# Patient Record
Sex: Female | Born: 1955 | Race: White | Hispanic: No | Smoking: Former smoker
Health system: Southern US, Community
[De-identification: ages and names within clinical notes are randomized; demographics above are authoritative.]

## PROBLEM LIST (undated history)

## (undated) DIAGNOSIS — I10 Essential (primary) hypertension: Secondary | ICD-10-CM

## (undated) DIAGNOSIS — R609 Edema, unspecified: Secondary | ICD-10-CM

## (undated) DIAGNOSIS — M1990 Unspecified osteoarthritis, unspecified site: Secondary | ICD-10-CM

## (undated) DIAGNOSIS — R112 Nausea with vomiting, unspecified: Secondary | ICD-10-CM

## (undated) DIAGNOSIS — K219 Gastro-esophageal reflux disease without esophagitis: Secondary | ICD-10-CM

## (undated) DIAGNOSIS — E876 Hypokalemia: Secondary | ICD-10-CM

## (undated) DIAGNOSIS — J189 Pneumonia, unspecified organism: Secondary | ICD-10-CM

## (undated) DIAGNOSIS — J449 Chronic obstructive pulmonary disease, unspecified: Secondary | ICD-10-CM

## (undated) DIAGNOSIS — Z9889 Other specified postprocedural states: Secondary | ICD-10-CM

## (undated) DIAGNOSIS — R7303 Prediabetes: Secondary | ICD-10-CM

## (undated) DIAGNOSIS — M549 Dorsalgia, unspecified: Secondary | ICD-10-CM

## (undated) DIAGNOSIS — F419 Anxiety disorder, unspecified: Secondary | ICD-10-CM

## (undated) DIAGNOSIS — K279 Peptic ulcer, site unspecified, unspecified as acute or chronic, without hemorrhage or perforation: Secondary | ICD-10-CM

## (undated) DIAGNOSIS — K5790 Diverticulosis of intestine, part unspecified, without perforation or abscess without bleeding: Secondary | ICD-10-CM

## (undated) DIAGNOSIS — E7800 Pure hypercholesterolemia, unspecified: Secondary | ICD-10-CM

## (undated) DIAGNOSIS — G629 Polyneuropathy, unspecified: Secondary | ICD-10-CM

## (undated) DIAGNOSIS — C4491 Basal cell carcinoma of skin, unspecified: Secondary | ICD-10-CM

## (undated) DIAGNOSIS — G8929 Other chronic pain: Secondary | ICD-10-CM

## (undated) DIAGNOSIS — C801 Malignant (primary) neoplasm, unspecified: Secondary | ICD-10-CM

## (undated) HISTORY — PX: CHOLECYSTECTOMY: SHX55

## (undated) HISTORY — PX: KNEE SURGERY: SHX244

## (undated) HISTORY — DX: Polyneuropathy, unspecified: G62.9

## (undated) HISTORY — PX: ABDOMINAL HYSTERECTOMY: SHX81

## (undated) HISTORY — DX: Basal cell carcinoma of skin, unspecified: C44.91

---

## 1990-08-23 DIAGNOSIS — C801 Malignant (primary) neoplasm, unspecified: Secondary | ICD-10-CM

## 1990-08-23 HISTORY — DX: Malignant (primary) neoplasm, unspecified: C80.1

## 2003-01-31 ENCOUNTER — Ambulatory Visit (HOSPITAL_COMMUNITY): Admission: RE | Admit: 2003-01-31 | Discharge: 2003-01-31 | Payer: Self-pay

## 2003-08-24 HISTORY — PX: CARDIAC CATHETERIZATION: SHX172

## 2004-04-07 ENCOUNTER — Inpatient Hospital Stay (HOSPITAL_COMMUNITY): Admission: EM | Admit: 2004-04-07 | Discharge: 2004-04-10 | Payer: Self-pay | Admitting: Emergency Medicine

## 2004-04-07 ENCOUNTER — Encounter: Payer: Self-pay | Admitting: Emergency Medicine

## 2004-04-08 ENCOUNTER — Encounter (INDEPENDENT_AMBULATORY_CARE_PROVIDER_SITE_OTHER): Payer: Self-pay | Admitting: Cardiology

## 2006-03-30 ENCOUNTER — Ambulatory Visit (HOSPITAL_COMMUNITY): Admission: RE | Admit: 2006-03-30 | Discharge: 2006-03-30 | Payer: Self-pay | Admitting: Family Medicine

## 2007-12-01 ENCOUNTER — Ambulatory Visit (HOSPITAL_COMMUNITY): Admission: RE | Admit: 2007-12-01 | Discharge: 2007-12-01 | Payer: Self-pay | Admitting: Family Medicine

## 2007-12-11 ENCOUNTER — Ambulatory Visit (HOSPITAL_COMMUNITY): Admission: RE | Admit: 2007-12-11 | Discharge: 2007-12-11 | Payer: Self-pay | Admitting: Family Medicine

## 2007-12-25 ENCOUNTER — Encounter (INDEPENDENT_AMBULATORY_CARE_PROVIDER_SITE_OTHER): Payer: Self-pay | Admitting: General Surgery

## 2007-12-25 ENCOUNTER — Ambulatory Visit (HOSPITAL_COMMUNITY): Admission: RE | Admit: 2007-12-25 | Discharge: 2007-12-25 | Payer: Self-pay | Admitting: General Surgery

## 2008-01-20 ENCOUNTER — Emergency Department (HOSPITAL_COMMUNITY): Admission: EM | Admit: 2008-01-20 | Discharge: 2008-01-20 | Payer: Self-pay | Admitting: Emergency Medicine

## 2008-07-20 ENCOUNTER — Emergency Department (HOSPITAL_COMMUNITY): Admission: EM | Admit: 2008-07-20 | Discharge: 2008-07-20 | Payer: Self-pay | Admitting: Emergency Medicine

## 2008-10-30 ENCOUNTER — Emergency Department (HOSPITAL_COMMUNITY): Admission: EM | Admit: 2008-10-30 | Discharge: 2008-10-30 | Payer: Self-pay | Admitting: Emergency Medicine

## 2009-07-25 ENCOUNTER — Ambulatory Visit (HOSPITAL_COMMUNITY): Admission: RE | Admit: 2009-07-25 | Discharge: 2009-07-25 | Payer: Self-pay | Admitting: Family Medicine

## 2010-03-06 ENCOUNTER — Emergency Department (HOSPITAL_COMMUNITY): Admission: EM | Admit: 2010-03-06 | Discharge: 2010-03-06 | Payer: Self-pay | Admitting: Emergency Medicine

## 2010-07-11 ENCOUNTER — Emergency Department (HOSPITAL_COMMUNITY)
Admission: EM | Admit: 2010-07-11 | Discharge: 2010-07-11 | Payer: Self-pay | Source: Home / Self Care | Admitting: Emergency Medicine

## 2010-08-25 ENCOUNTER — Ambulatory Visit (HOSPITAL_COMMUNITY)
Admission: RE | Admit: 2010-08-25 | Discharge: 2010-08-25 | Payer: Self-pay | Source: Home / Self Care | Attending: Family Medicine | Admitting: Family Medicine

## 2010-11-03 LAB — BASIC METABOLIC PANEL
CO2: 26 mEq/L (ref 19–32)
Calcium: 10.3 mg/dL (ref 8.4–10.5)
GFR calc Af Amer: >60 mL/min (ref >60)
Glucose, Bld: 124 mg/dL — ABNORMAL HIGH (ref 70–99)
Potassium: 3.7 mEq/L (ref 3.5–5.1)
Sodium: 139 mEq/L (ref 135–145)

## 2010-11-03 LAB — DIFFERENTIAL
Eosinophils Absolute: 0.2 10*3/uL (ref 0.0–0.7)
Lymphs Abs: 2.6 10*3/uL (ref 0.7–4.0)
Monocytes Absolute: 0.7 10*3/uL (ref 0.1–1.0)
Monocytes Relative: 8 % (ref 3–12)
Neutro Abs: 5.8 10*3/uL (ref 1.7–7.7)
Neutrophils Relative %: 62 % (ref 43–77)

## 2010-11-03 LAB — CBC
HCT: 41 % (ref 36.0–46.0)
Hemoglobin: 14 g/dL (ref 12.0–15.0)
MCH: 30.9 pg (ref 26.0–34.0)
RBC: 4.52 MIL/uL (ref 3.87–5.11)

## 2010-11-07 LAB — CBC
HCT: 43.9 % (ref 36.0–46.0)
Hemoglobin: 15 g/dL (ref 12.0–15.0)
MCH: 31.6 pg (ref 26.0–34.0)
MCHC: 34.2 g/dL (ref 30.0–36.0)

## 2010-11-07 LAB — DIFFERENTIAL
Basophils Relative: 1 % (ref 0–1)
Eosinophils Absolute: 0.1 10*3/uL (ref 0.0–0.7)
Eosinophils Relative: 1 % (ref 0–5)
Monocytes Absolute: 0.8 10*3/uL (ref 0.1–1.0)
Monocytes Relative: 6 % (ref 3–12)

## 2010-11-07 LAB — BASIC METABOLIC PANEL
BUN: 15 mg/dL (ref 6–23)
CO2: 24 mEq/L (ref 19–32)
Glucose, Bld: 124 mg/dL — ABNORMAL HIGH (ref 70–99)
Potassium: 3.5 mEq/L (ref 3.5–5.1)
Sodium: 137 mEq/L (ref 135–145)

## 2010-11-07 LAB — POCT CARDIAC MARKERS
Myoglobin, poc: 67.3 ng/mL (ref 12–200)
Troponin i, poc: <0.05 ng/mL (ref 0.00–0.09)

## 2011-01-05 NOTE — H&P (Signed)
NAMECYNTHIE, GARMON               ACCOUNT NO.:  [id number]   MEDICAL RECORD NO.:  [id number]          PATIENT TYPE:  AMB   LOCATION:  DAY                           FACILITY:  [facility]   PHYSICIAN:  Dalia Heading, M.D.  DATE OF BIRTH:  October 20, 1955   DATE OF ADMISSION:  12/25/2007  DATE OF DISCHARGE:  LH                              HISTORY & PHYSICAL   CHIEF COMPLAINT:  Left breast neoplasm.   HISTORY OF PRESENT ILLNESS:  The patient is a 52-old-year white female  who is referred for evaluation and treatment of the left breast  neoplasm.  This was found on routine mammography and ultrasound.  She  denies feeling a lump.  No immediate family history of breast carcinoma  or nipple discharge is noted.   PAST MEDICAL HISTORY:  Includes hypertension.   PAST SURGICAL HISTORY:  1. Hysterectomy.  2. Cholecystectomy.  3. Bilateral knee surgeries.   CURRENT MEDICATIONS:  Blood pressure pill and a fluid pill.   ALLERGIES:  No known drug allergies.   REVIEW OF SYSTEMS:  The patient smokes half pack of cigarettes a day.  She drinks wine socially.  She denies any other cardiopulmonary  difficulties or bleeding disorders.   PHYSICAL EXAMINATION:  The patient is a well-developed, well-nourished  white female in no acute distress.  NECK:  Supple without lymphadenopathy.  LUNGS:  Clear to auscultation with equal breath sounds bilaterally.  HEART:  Reveals regular rate and rhythm without S3, S4, murmurs.  BREAST:  Right breast examination reveals no dominant mass, nipple  discharge, or dimpling.  The axilla is negative for palpable nodes.  Left breast examination reveals no dominant mass, nipple discharge, or  dimpling.  The axilla was negative for palpable nodes.   Mammography and ultrasound revealed a suspicious area in the left breast  at the 10 o'clock position.   IMPRESSION:  Left breast neoplasm.   PLAN:  The patient is scheduled for left breast biopsy after needle  localization on  Dec 25, 2007.  The risks and benefits of the procedure  including bleeding and infection were fully explained to the patient,  gave informed consent.      Dalia Heading, M.D.  Electronically Signed     MAJ/MEDQ  D:  12/19/2007  T:  12/20/2007  Job:  [id number]   cc:   [facility]  Fax: [phone]

## 2011-01-05 NOTE — Op Note (Signed)
[name], [name]               ACCOUNT NO.:  [id number]   MEDICAL RECORD NO.:  [id number]          PATIENT TYPE:  AMB   LOCATION:  DAY                           FACILITY:  [facility]   PHYSICIAN:  Dalia Heading, M.D.  DATE OF BIRTH:  February 19, 1956   DATE OF PROCEDURE:  12/25/2007  DATE OF DISCHARGE:                               OPERATIVE REPORT   PREOPERATIVE DIAGNOSIS:  Left breast mass, nonpalpable.   POSTOPERATIVE DIAGNOSIS:  Left breast mass, nonpalpable.   PROCEDURE:  Left breast biopsy after needle localization.   SURGEON:  Dalia Heading, MD   ANESTHESIA:  General.   INDICATIONS:  The patient is a 55 year old white female who is referred  for left breast biopsy after needle localization.  She has a suspicious  area at approximately the 12 o'clock position on mammogram, which is  suspicious.  The risks and benefits of the procedure including bleeding  and infection were fully explained to the patient, gave informed  consent.   PROCEDURE NOTE:  The patient was placed in a supine position after  needle localization was performed in the mammography suite.  The left  breast was prepped and draped in the usual sterile technique with  Betadine after induction of general anesthesia was performed.  The left  breast was prepped and draped in the usual sterile technique with  Betadine.  Surgical site confirmation was performed.   An incision was made that included the needle.  A coned section of  breast tissue was removed down to the tip of the needle.  The specimen  was then sent to Radiology for specimen radiography.  The suspicious  area was within the specimen removed.  It was then sent for pathology  for further examination.  Any bleeding was controlled using Bovie  electrocautery.  The skin was reapproximated using a 4-0 Vicryl  subcuticular suture.  A 0.5 Sensorcaine was instilled in the surrounding  wound.  Dermabond was then applied.   All tape and needle counts were  correct at the end of procedure.  The  patient was awakened and transferred to PACU in stable condition.   COMPLICATIONS:  None.   SPECIMEN:  Left breast biopsy.   ESTIMATED BLOOD LOSS:  Minimal.      Dalia Heading, M.D.  Electronically Signed     MAJ/MEDQ  D:  12/25/2007  T:  12/26/2007  Job:  [id number]

## 2011-01-08 NOTE — Cardiovascular Report (Signed)
[name], [name]                         ACCOUNT NO.:  [id number]   MEDICAL RECORD NO.:  [id number]                   PATIENT TYPE:  INP   LOCATION:  6533                                 FACILITY:  [facility]   PHYSICIAN:  John R. Tysinger, M.D.              DATE OF BIRTH:  Sep 18, 1955   DATE OF PROCEDURE:  04/07/2004  DATE OF DISCHARGE:                              CARDIAC CATHETERIZATION   REFERRING PHYSICIAN:  Dr. Nicki Guadalajara.   PROCEDURE:  1. Left heart catheterization, urgent.  2. Coronary cineangiography.  3. Left ventricular cineangiography.  4. Angio-Seal of the right femoral artery.   INDICATION FOR PROCEDURES:  This 55 year old female without a prior history  of heart disease has been having chest pain off and on for the last two  weeks.  Today she experienced another onset of chest pain with radiation  into her neck and left jaw, into the left back and shoulder and left arm.  She says that she feels like an elephant is sitting on her chest.  She  presented to the emergency department at [facility] and the  emergency room doctor referred her here for urgent care.  Her cardiac  markers were negative twice and her EKG did not show any acute changes.  She  was started an IV heparin and nitroglycerin drip without relief and was  given 6 mg of IV morphine.  In the emergency room at [facility] she was  given a GI cocktail with partial relief but then the heaviness and pressure  returned.  After discussing her condition and medical workup, she became  very anxious and said that her pain was getting worse and she seemed very  relieved when the decision was made to do her cardiac cath on an urgent  basis.   PROCEDURE:  After signing an informed consent, the patient was brought to  the cardiac catheterization lab where her right groin was prepped and draped  in a sterile fashion and anesthetized locally with 1% lidocaine.  A 6 French  introducer sheath was inserted  percutaneously into the right femoral artery.  The 6 [place] #4 Judkins coronary catheters were used to make injections into  the native coronary arteries.  A 6 French pigtail catheter was used to  measure pressures in the left ventricle and aorta and to make a midstream  injection into the left ventricle.  Patient tolerated the procedure well.  No complications were noted.  At the end of the procedure, the catheter and  sheath were removed from the right femoral artery and hemostasis was easily  obtained with an Angio-Seal closure system.   MEDICATIONS GIVEN:  None.   CINE FINDINGS:   CORONARY CINEANGIOGRAPHY:  1. Left coronary artery.  The ostium and left main appear normal.  2. Left anterior descending.  The LAD has mild ectasia in the proximal     segment with mild dye hang  up.  Otherwise, the LAD appears normal without     significant plaque and without significant stenosis.  The diagonal and     septal branches appear normal.  3. Circumflex coronary artery.  The circumflex is normal in appearance,     supplying the obtuse marginal and posterolateral branches.  There was no     significant plaque and no evidence of ectasia in the circumflex.  4. Right coronary artery.  The ostium appears normal.  There is an area of     mild ectasia in the proximal segment with mild hang up.  The middle and     distal segments appear normal.  The distal right coronary artery supplies     the posterior descending and two posterolateral branches which are normal     in appearance.   LEFT VENTRICULAR CINEANGIOGRAM:  The left ventricular chamber size is  normal.  The left ventricular wall thickness appears to be mildly increased.  The ejection fraction was estimated at approximately 80% and there is  generalized hyperdynamic contraction.  The mitral and aortic valves appear  normal.   FINAL DIAGNOSES:  1. 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  2. No evidence for atherosclerotic stenosis of the coronary arteries.  3. Hyperdynamic left ventricular function, ejection fraction 80%.  4. Possible hypertrophic cardiomyopathy.  5. Successful Angio-Seal of the right femoral artery.   DISPOSITION:  Will monitor over night and defer further workup and treatment  as per Dr. Tresa Endo.  Will continue the daily aspirin and possibly need to  increase her beta-blocker as tolerated.  Will also increase her Protonix to  b.i.d. and defer the counseling for her coronary ectasia to Dr. Tresa Endo.                                               John R. Tysinger, M.D.    JRT/MEDQ  D:  04/07/2004  T:  04/08/2004  Job:  [id number]   cc:   Nicki Guadalajara, M.D.  [phone] [address]  [place], [place] [address]  Fax: [phone]   Cath Lab - [facility]

## 2011-01-08 NOTE — Consult Note (Signed)
NAMETAYAH, IDROVO                         ACCOUNT NO.:  [id number]   MEDICAL RECORD NO.:  [id number]                   PATIENT TYPE:  INP   LOCATION:  6533                                 FACILITY:  [facility]   PHYSICIAN:  Althea Grimmer. Luther Parody, M.D.            DATE OF BIRTH:  July 18, 1956   DATE OF CONSULTATION:  04/09/2004  DATE OF DISCHARGE:                                   CONSULTATION   Ms. [name] is a 55 year old female whom I am asked to see for atypical  chest pain.  She has no history of coronary artery disease.  She entered the  hospital on April 07, 2004, complaining of burning in the chest along with  feeling of heavy pressure.  EKG was negative and cardiac markers were  negative.  A catheterization demonstrated ectatic coronaries but no  significant lesions that should be causing this discomfort.  She was given a  GI cocktail in the emergency room which did cause some relief of her  symptoms.  She has had some nausea and vomiting and the day after admission  has mild diarrhea.  She describes moderate heartburn untreated as an  outpatient.  She is status post cholecystectomy.  CT scan of the chest did  not demonstrate any emboli, aortic dissection or significant esophageal  pathology.  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  At current moment she says she still has some burning and discomfort of  pressure type in her chest.   PAST MEDICAL HISTORY:  She is status post cholecystectomy, has an underlying  diagnosis of hypertension, and underwent hysterectomy for gynecologic  cancer.   CURRENT MEDICATIONS IN HOSPITAL:  1. Aspirin.  2. Metoprolol XL 25 mg daily.  3. Protonix 40 mg b.i.d.  4. Wellbutrin 100 mg b.i.d.   ALLERGIES:  NONE REPORTED.   SOCIAL HISTORY:  She is a smoker since age 49.   REVIEW OF SYSTEMS:  GENERAL:  No weight loss or night sweats.  ENDOCRINE:  No history of diabetes or thyroid problems.  SKIN:  No rash, pruritus.  EYES:   No icterus or change in vision.  ENT:  No aphthous ulcers or chronic  sore throat.  RESPIRATORY:  No shortness of breath, cough or wheezing.  CARDIAC:  As above.  GASTROINTESTINAL:  As above.  GENITOURINARY:  No  dysuria or hematuria.   PHYSICAL EXAMINATION:  She is an obese adult female in no acute distress,  afebrile with stable vital signs.  SKIN:  Normal.  EYES:  Anicteric.  Oropharynx unremarkable.  NECK:  Supple without thyromegaly.  There is no cervical or inguinal  adenopathy.  CHEST SOUNDS:  Clear.  HEART SOUNDS:  Regular rate and rhythm.  ABDOMEN:  Obese with minimal epigastric tenderness to deep palpation.  Bowel  sounds normal.  No masses.  EXTREMITIES:  Without cyanosis, clubbing, edema or rash.   LABORATORY TESTS:  Reveal an  admission hemoglobin of 13 with drop to 11.6  after hydration __________.  Prothrombin time and liver function tests are  normal.   IMPRESSION:  Atypical chest pain in a 55 year old female with cardiac  evaluation that does not seem to explain her symptoms.  These are not likely  to be due to a biliary process and she is status post cholecystectomy and  her liver function tests are normal.  She does have some classic heartburn  and upper endoscopy is probably indicated to rule out complicated  esophagitis or any other upper GI pathology.   PLAN:  Please see the orders.  Upper endoscopy will be accomplished today.  The patient is in agreement after discussion of the procedure in terms of  __________ risks, complications including bleeding and perforation.   ADDENDUM:  Upper endoscopy reveals no GI pathology but multiple small antral  ulcers were seen.  Protonix b.i.d. should be sufficient for healing these.  She had a CLO test taken which is still pending.  It would be a good idea,  if possible, to decrease her aspirin to 81 mg per day and avoid other  NSAIDs.                                               Althea Grimmer. Luther Parody, M.D.     PJS/MEDQ  D:  04/09/2004  T:  04/09/2004  Job:  [id number]   cc:   Nicki Guadalajara, M.D.  [phone] [address]  [place], [place] [address]  Fax: [phone]   Nanetta Batty, M.D.  Fax: [phone]

## 2011-01-08 NOTE — Discharge Summary (Signed)
[name], [name]                         ACCOUNT NO.:  [id number]   MEDICAL RECORD NO.:  [id number]                   PATIENT TYPE:  INP   LOCATION:  6533                                 FACILITY:  [facility]   PHYSICIAN:  Nicki Guadalajara, M.D.                  DATE OF BIRTH:  1956/05/27   DATE OF ADMISSION:  04/07/2004  DATE OF DISCHARGE:  04/10/2004                                 DISCHARGE SUMMARY   DISCHARGE DIAGNOSES:  1. Chest pain consistent with unstable angina with abnormal EKG but normal     coronaries at catheterization.  2. Peptic ulcer disease with antral ulcers by endoscopy this admission,     CLOtest is pending at discharge.  3. Smoking.  4. Obesity.   HOSPITAL COURSE:  The patient is a 55 year old female followed by Dr. Fara Chute in [place].  She presented to [facility] ER with epigastric pain  consistent with unstable angina.  Her EKG was abnormal showing evidence of  poor anterior R wave progression consistent with an old anterolateral  infarct.  She was transferred to [facility]  Enzymes were negative x3.  She  continued to have pain which actually worsened when she got here and she was  taken to the cath lab by Dr. Aleen Campi.  Dr. Tresa Endo had received the original  call from the ER at [facility].  Catheterization showed no  significant coronary disease with normal LV function.  Dr. Aleen Campi felt she  may have hypertrophic cardiomyopathy. Echocardiogram was done which revealed  low normal LV function but normal LV wall thickness and no evidence of  significant LVH.  The patient was seen in consult by Dr. Roosvelt Harps.  Endoscopy was done April 10, 2003 which revealed antral ulcers.  The  patient does take an aspirin a day.  CLOtest is pending.  PPI is  recommended.  She did have a spiral CT also this admission that was negative  for pulmonary embolism but did show evidence of old granulomatous disease.  We feel the patient can be discharged April 10, 2004.   DISCHARGE MEDICATIONS:  Nexium 40 mg twice a day for 4 weeks and then once a  day.   LABORATORY DATA:  White count 7.7, hemoglobin 11.6, hematocrit 35.5,  platelets 212.  Sodium 141, potassium 3.5, BUN 16, creatinine 0.9.  CK-MB  was negative here.  Cholesterol 253, triglycerides 312, HDL 35, LDL 156.  TSH 2.32.   DISPOSITION:  The patient is discharged in stable condition and will follow  up with Dr. Domingo Sep in [place] once.  Will need to follow up her  CLOtest.  She has been instructed on a low fat diet and has been encouraged  to stop smoking.      Abelino Derrick, P.A.  Nicki Guadalajara, M.D.    Lenard Lance  D:  04/10/2004  T:  04/11/2004  Job:  [id number]   cc:   Fara Chute  [address] [place]  [place] [address]  Fax: [phone]   Althea Grimmer. Luther Parody, M.D.  [address]  [place]  [place] [address]  Fax: [phone]

## 2011-08-26 ENCOUNTER — Other Ambulatory Visit (HOSPITAL_COMMUNITY): Payer: Self-pay | Admitting: Family Medicine

## 2011-08-26 DIAGNOSIS — Z139 Encounter for screening, unspecified: Secondary | ICD-10-CM

## 2011-08-31 ENCOUNTER — Ambulatory Visit (HOSPITAL_COMMUNITY): Payer: PRIVATE HEALTH INSURANCE

## 2011-08-31 ENCOUNTER — Ambulatory Visit (HOSPITAL_COMMUNITY)
Admission: RE | Admit: 2011-08-31 | Discharge: 2011-08-31 | Disposition: A | Discharge status: Home / Self Care | Payer: PRIVATE HEALTH INSURANCE | Source: Ambulatory Visit | Attending: Family Medicine | Admitting: Family Medicine

## 2011-08-31 ENCOUNTER — Ambulatory Visit (HOSPITAL_COMMUNITY): Payer: Self-pay

## 2011-08-31 DIAGNOSIS — Z139 Encounter for screening, unspecified: Secondary | ICD-10-CM

## 2011-08-31 DIAGNOSIS — Z1231 Encounter for screening mammogram for malignant neoplasm of breast: Secondary | ICD-10-CM | POA: Insufficient documentation

## 2012-04-27 ENCOUNTER — Emergency Department (HOSPITAL_COMMUNITY)
Admission: EM | Admit: 2012-04-27 | Discharge: 2012-04-27 | Disposition: A | Discharge status: Home / Self Care | Payer: Self-pay | Attending: Emergency Medicine | Admitting: Emergency Medicine

## 2012-04-27 ENCOUNTER — Emergency Department (HOSPITAL_COMMUNITY): Payer: Self-pay

## 2012-04-27 ENCOUNTER — Encounter (HOSPITAL_COMMUNITY): Payer: Self-pay | Admitting: *Deleted

## 2012-04-27 DIAGNOSIS — R202 Paresthesia of skin: Secondary | ICD-10-CM

## 2012-04-27 DIAGNOSIS — E7800 Pure hypercholesterolemia, unspecified: Secondary | ICD-10-CM | POA: Insufficient documentation

## 2012-04-27 DIAGNOSIS — Z79899 Other long term (current) drug therapy: Secondary | ICD-10-CM | POA: Insufficient documentation

## 2012-04-27 DIAGNOSIS — R200 Anesthesia of skin: Secondary | ICD-10-CM

## 2012-04-27 DIAGNOSIS — F17200 Nicotine dependence, unspecified, uncomplicated: Secondary | ICD-10-CM | POA: Insufficient documentation

## 2012-04-27 DIAGNOSIS — R51 Headache: Secondary | ICD-10-CM | POA: Insufficient documentation

## 2012-04-27 DIAGNOSIS — R209 Unspecified disturbances of skin sensation: Secondary | ICD-10-CM | POA: Insufficient documentation

## 2012-04-27 DIAGNOSIS — M542 Cervicalgia: Secondary | ICD-10-CM | POA: Insufficient documentation

## 2012-04-27 DIAGNOSIS — I10 Essential (primary) hypertension: Secondary | ICD-10-CM | POA: Insufficient documentation

## 2012-04-27 HISTORY — DX: Other chronic pain: G89.29

## 2012-04-27 HISTORY — DX: Hypokalemia: E87.6

## 2012-04-27 HISTORY — DX: Essential (primary) hypertension: I10

## 2012-04-27 HISTORY — DX: Dorsalgia, unspecified: M54.9

## 2012-04-27 HISTORY — DX: Edema, unspecified: R60.9

## 2012-04-27 HISTORY — DX: Pure hypercholesterolemia, unspecified: E78.00

## 2012-04-27 LAB — BASIC METABOLIC PANEL
BUN: 14 mg/dL (ref 6–23)
GFR calc Af Amer: 67 mL/min — ABNORMAL LOW (ref >90)
GFR calc non Af Amer: 57 mL/min — ABNORMAL LOW (ref >90)
Potassium: 3.6 mEq/L (ref 3.5–5.1)
Sodium: 139 mEq/L (ref 135–145)

## 2012-04-27 LAB — CBC WITH DIFFERENTIAL/PLATELET
Basophils Absolute: 0 10*3/uL (ref 0.0–0.1)
Basophils Relative: 0 % (ref 0–1)
Eosinophils Absolute: 0.4 10*3/uL (ref 0.0–0.7)
HCT: 39.3 % (ref 36.0–46.0)
Hemoglobin: 13.1 g/dL (ref 12.0–15.0)
MCH: 30.8 pg (ref 26.0–34.0)
MCHC: 33.3 g/dL (ref 30.0–36.0)
MCV: 92.5 fL (ref 78.0–100.0)
Monocytes Relative: 7 % (ref 3–12)
Neutrophils Relative %: 53 % (ref 43–77)
Platelets: 250 10*3/uL (ref 150–400)
RBC: 4.25 MIL/uL (ref 3.87–5.11)
RDW: 13.7 % (ref 11.5–15.5)
WBC: 10.8 10*3/uL — ABNORMAL HIGH (ref 4.0–10.5)

## 2012-04-27 MED ORDER — PREDNISONE 10 MG PO TABS: ORAL_TABLET | ORAL | Status: DC

## 2012-04-27 MED ORDER — CYCLOBENZAPRINE HCL 5 MG PO TABS: 5.0000 mg | ORAL_TABLET | Freq: Three times a day (TID) | ORAL | Status: AC | PRN

## 2012-04-27 NOTE — ED Notes (Signed)
[name] in room to discuss Tele-Neurology consult.

## 2012-04-27 NOTE — ED Notes (Signed)
Reports left hand numbness radiating into left arm that started approx 2300 last night.  Denies difficulty ambulating, denies slurred speech.  Reports weakness to left side.  Mild left grip strength weakness noted in triage.  Face symmetrical, speech clear.

## 2012-04-27 NOTE — ED Notes (Signed)
Pt called out and said she did not want to wait for [facility] Neurology consult. Leona Singleton PA aware and notified.

## 2012-04-27 NOTE — ED Provider Notes (Signed)
History     CSN: [id number]  Arrival date & time 04/27/12  0820   First MD Initiated Contact with Patient 04/27/12 [phone]      Chief Complaint  Patient presents with  . Numbness    (Consider location/radiation/quality/duration/timing/severity/associated sxs/prior treatment) HPI Comments: [name] presents with numbness and weakness in her left upper extremity which started around 11pm last night while she was watching tv.  She reports prior episodes of similar symptoms lasting for 1-2 hours, than resolving. She has also developed a mild left sided headache.  She denies chest pain, shortness of breath, palpitations and has had no recent falls or injury.  She feels "tight" along her left lateral neck and also feels pressure along her left shoulder blade which is worse with palpation.  She has had no problems with dizziness,  Ambulating, and has had no facial weakness, numbness or slurred speech.  She feels weaker, however in her left arm.    The history is provided by the patient and a relative.    Past Medical History  Diagnosis Date  . Hypertension   . High cholesterol   . Fluid retention   . Hypokalemia   . Chronic back pain     Past Surgical History  Procedure Date  . Abdominal hysterectomy   . Cholecystectomy   . Knee surgery     right and left    No family history on file.  History  Substance Use Topics  . Smoking status: Current Everyday Smoker  . Smokeless tobacco: Not on file  . Alcohol Use: Yes     occasional    OB History    Grav Para Term Preterm Abortions TAB SAB Ect Mult Living                  Review of Systems  Constitutional: Negative for fever.  HENT: Positive for neck pain. Negative for congestion and sore throat.   Eyes: Negative.   Respiratory: Negative for chest tightness and shortness of breath.   Cardiovascular: Negative for chest pain.  Gastrointestinal: Negative for nausea and abdominal pain.  Genitourinary: Negative.     Musculoskeletal: Negative for joint swelling and arthralgias.  Skin: Negative.  Negative for rash and wound.  Neurological: Positive for weakness, numbness and headaches. Negative for dizziness, facial asymmetry, speech difficulty and light-headedness.  Hematological: Negative.   Psychiatric/Behavioral: Negative.     Allergies  Review of patient's allergies indicates no known allergies.  Home Medications   Current Outpatient Rx  Name Route Sig Dispense Refill  . ASPIRIN 325 MG PO TABS Oral Take 325 mg by mouth daily.    . FUROSEMIDE 20 MG PO TABS Oral Take 20 mg by mouth daily.    Marland Kitchen GEMFIBROZIL 600 MG PO TABS Oral Take 600 mg by mouth 2 (two) times daily before a meal.    . LISINOPRIL-HYDROCHLOROTHIAZIDE 20-12.5 MG PO TABS Oral Take 1 tablet by mouth daily.    . ONE-A-DAY WOMENS 50 PLUS PO Oral Take 1 tablet by mouth daily.    Marland Kitchen POTASSIUM GLUCONATE 550 MG PO TABS Oral Take 1-2 tablets by mouth daily. Takes with fluid pill. May take extra tablet due to cramping in legs.    Marland Kitchen PRAVASTATIN SODIUM 40 MG PO TABS Oral Take 40 mg by mouth daily.    . CYCLOBENZAPRINE HCL 5 MG PO TABS Oral Take 1 tablet (5 mg total) by mouth 3 (three) times daily as needed for muscle spasms. 15 tablet 0  .  PREDNISONE 10 MG PO TABS  6, 5, 4, 3, 2 then 1 tablet by mouth daily for 6 days total. 21 tablet 0    BP 148/91  Pulse 67  Temp 97.8 F (36.6 C) (Oral)  Resp 17  SpO2 95%  Physical Exam  Nursing note and vitals reviewed. Constitutional: She is oriented to person, place, and time. She appears well-developed and well-nourished.  HENT:  Head: Normocephalic and atraumatic.  Mouth/Throat: Oropharynx is clear and moist.  Eyes: EOM are normal. Pupils are equal, round, and reactive to light.  Neck: Normal range of motion. Neck supple. Muscular tenderness present. No spinous process tenderness present. Carotid bruit is not present. No edema, no erythema and normal range of motion present.     Cardiovascular: Normal rate, normal heart sounds and intact distal pulses.   Pulmonary/Chest: Effort normal.  Abdominal: Soft. There is no tenderness.  Musculoskeletal: Normal range of motion.  Lymphadenopathy:    She has no cervical adenopathy.  Neurological: She is alert and oriented to person, place, and time. She has normal strength. A sensory deficit is present. Gait normal. GCS eye subscore is 4. GCS verbal subscore is 5. GCS motor subscore is 6.  Reflex Scores:      Bicep reflexes are 2+ on the right side and 2+ on the left side.      Brachioradialis reflexes are 2+ on the right side and 2+ on the left side.      Normal heel-shin, normal rapid alternating movements. Cranial nerves III-XII intact.  No pronator drift. Grip strength 4/5 left, 5/5 right (pt is right handed).  Decreased sensation to fine touch along the entire left upper extremity including hand and fingers.  Skin: Skin is warm and dry. No rash noted.  Psychiatric: She has a normal mood and affect. Her speech is normal and behavior is normal. Thought content normal. Cognition and memory are normal.    ED Course  Procedures (including critical care time)  Labs Reviewed  CBC WITH DIFFERENTIAL - Abnormal; Notable for the following:    WBC 10.8 (*)     All other components within normal limits  BASIC METABOLIC PANEL - Abnormal; Notable for the following:    Glucose, Bld 103 (*)     GFR calc non Af Amer 57 (*)     GFR calc Af Amer 67 (*)     All other components within normal limits  TROPONIN I   Dg Chest 2 View  04/27/2012  *RADIOLOGY REPORT*  Clinical Data: Left arm numbness and upper back pain.  CHEST - 2 VIEW  Comparison: CT chest 04/08/2004 and PA and lateral chest 07/11/2010.  Findings: Right middle lobe calcified granuloma is again seen. Mild linear atelectasis or scar in the lung bases is noted.  Heart size is upper normal.  No pneumothorax or pleural fluid.  IMPRESSION: No acute finding.  Stable compared  prior exam.   Original Report Authenticated By: Bernadene Bell. Maricela Curet, M.D.    Ct Head Wo Contrast  04/27/2012  *RADIOLOGY REPORT*  Clinical Data: Headache and left-sided numbness  CT HEAD WITHOUT CONTRAST  Technique:  Axial CT images were obtained from the skull base to the vertex at 4.8 mm intervals without intravenous contrast administration.  Study was obtained within 24 hours of patient arrival at the hospital.  Comparison:  Jan 20, 2008  Findings: The ventricles are normal in size and configuration. There is no mass, hemorrhage, extra-axial fluid collection, or midline shift.  There is mild  small vessel disease in the centra semiovale bilaterally.  No acute infarct is seen.  Gray-white compartments are otherwise normal.  Bony calvarium appears intact.  Mastoid air cells are clear.  IMPRESSION: Mild small vessel disease. Study otherwise unremarkable.   Original Report Authenticated By: Arvin Collard. WOODRUFF III, M.D.      1. Numbness and tingling in left arm       MDM  Discussed case with Dr. Ignacia Palma who recommended telepsych consult.  This was completed with phone conversation with Dr. Silvestre Mesi  Whose assessment on chart (faxed copy) - differential dx - either cervical radiculopathy vs migraine.  Recommended outpatient neurology consult, no need for admission.  Also presentation not consistent with TIA.  Pt referred to Dr. Gerilyn Pilgrim for recheck.  Pt agreeable with plan.  Pt does not have a pcp.  Advised to return here for any worsened sx.  Prescribed prednisone taper,  Flexeril,  Encouraged heating pad 20 minutes to shoulder and neck several times daily.     Date: 04/27/2012  Rate: 69  Rhythm: normal sinus rhythm  QRS Axis: left  Intervals: QT prolonged  ST/T Wave abnormalities: normal  Conduction Disutrbances:none  Narrative Interpretation:   Old EKG Reviewed: unchanged      Burgess Amor, PA 04/27/12 1444  Burgess Amor, [place] 04/27/12 1444

## 2012-04-27 NOTE — ED Notes (Signed)
Cont to wait for teleneurology consult

## 2012-04-27 NOTE — ED Notes (Signed)
Pt informed tech she was not waiting any longer for consult. States she wants her stuff and she will go to a neurologist. EDP aware

## 2012-04-28 NOTE — ED Provider Notes (Signed)
Medical screening examination/treatment/procedure(s) were performed by non-physician practitioner and as supervising physician I was immediately available for consultation/collaboration.   Carleene Cooper III, MD 04/28/12 [phone]

## 2012-09-28 ENCOUNTER — Other Ambulatory Visit (HOSPITAL_COMMUNITY): Payer: Self-pay | Admitting: Nurse Practitioner

## 2012-09-28 DIAGNOSIS — Z139 Encounter for screening, unspecified: Secondary | ICD-10-CM

## 2012-10-02 ENCOUNTER — Ambulatory Visit (HOSPITAL_COMMUNITY)
Admission: RE | Admit: 2012-10-02 | Discharge: 2012-10-02 | Disposition: A | Discharge status: Home / Self Care | Payer: PRIVATE HEALTH INSURANCE | Source: Ambulatory Visit | Attending: Nurse Practitioner | Admitting: Nurse Practitioner

## 2012-10-02 DIAGNOSIS — Z1231 Encounter for screening mammogram for malignant neoplasm of breast: Secondary | ICD-10-CM | POA: Insufficient documentation

## 2012-10-02 DIAGNOSIS — Z139 Encounter for screening, unspecified: Secondary | ICD-10-CM

## 2012-11-22 ENCOUNTER — Ambulatory Visit (HOSPITAL_COMMUNITY)
Admission: RE | Admit: 2012-11-22 | Discharge: 2012-11-22 | Disposition: A | Discharge status: Home / Self Care | Payer: BC Managed Care – PPO | Source: Ambulatory Visit | Attending: Internal Medicine | Admitting: Internal Medicine

## 2012-11-22 ENCOUNTER — Other Ambulatory Visit (HOSPITAL_COMMUNITY): Payer: Self-pay | Admitting: Internal Medicine

## 2012-11-22 DIAGNOSIS — J189 Pneumonia, unspecified organism: Secondary | ICD-10-CM

## 2012-11-22 DIAGNOSIS — Z09 Encounter for follow-up examination after completed treatment for conditions other than malignant neoplasm: Secondary | ICD-10-CM | POA: Insufficient documentation

## 2013-01-17 ENCOUNTER — Other Ambulatory Visit: Payer: Self-pay | Admitting: Orthopedic Surgery

## 2013-01-18 ENCOUNTER — Encounter (HOSPITAL_COMMUNITY): Payer: Self-pay | Admitting: Pharmacy Technician

## 2013-01-23 ENCOUNTER — Encounter (HOSPITAL_COMMUNITY)
Admission: RE | Admit: 2013-01-23 | Discharge: 2013-01-23 | Disposition: A | Discharge status: Home / Self Care | Payer: BC Managed Care – PPO | Source: Ambulatory Visit | Attending: Orthopedic Surgery | Admitting: Orthopedic Surgery

## 2013-01-23 ENCOUNTER — Encounter (HOSPITAL_COMMUNITY): Payer: Self-pay

## 2013-01-23 DIAGNOSIS — Z01812 Encounter for preprocedural laboratory examination: Secondary | ICD-10-CM | POA: Insufficient documentation

## 2013-01-23 DIAGNOSIS — Z01818 Encounter for other preprocedural examination: Secondary | ICD-10-CM | POA: Insufficient documentation

## 2013-01-23 DIAGNOSIS — Z01810 Encounter for preprocedural cardiovascular examination: Secondary | ICD-10-CM | POA: Insufficient documentation

## 2013-01-23 HISTORY — DX: Unspecified osteoarthritis, unspecified site: M19.90

## 2013-01-23 HISTORY — DX: Malignant (primary) neoplasm, unspecified: C80.1

## 2013-01-23 HISTORY — DX: Pneumonia, unspecified organism: J18.9

## 2013-01-23 LAB — CBC WITH DIFFERENTIAL/PLATELET
Eosinophils Relative: 3 % (ref 0–5)
HCT: 39.7 % (ref 36.0–46.0)
Hemoglobin: 13.2 g/dL (ref 12.0–15.0)
Lymphocytes Relative: 31 % (ref 12–46)
Lymphs Abs: 3.1 10*3/uL (ref 0.7–4.0)
MCV: 90.2 fL (ref 78.0–100.0)
Monocytes Absolute: 0.7 10*3/uL (ref 0.1–1.0)
Platelets: 252 10*3/uL (ref 150–400)
RBC: 4.4 MIL/uL (ref 3.87–5.11)
WBC: 10.1 10*3/uL (ref 4.0–10.5)

## 2013-01-23 LAB — URINALYSIS, ROUTINE W REFLEX MICROSCOPIC
Glucose, UA: NEGATIVE mg/dL
Ketones, ur: NEGATIVE mg/dL
Leukocytes, UA: NEGATIVE
Specific Gravity, Urine: 1.014 (ref 1.005–1.030)
pH: 6 (ref 5.0–8.0)

## 2013-01-23 LAB — BASIC METABOLIC PANEL
BUN: 19 mg/dL (ref 6–23)
Calcium: 9.9 mg/dL (ref 8.4–10.5)
Creatinine, Ser: 1.27 mg/dL — ABNORMAL HIGH (ref 0.50–1.10)
GFR calc non Af Amer: 45 mL/min — ABNORMAL LOW (ref >90)
Glucose, Bld: 116 mg/dL — ABNORMAL HIGH (ref 70–99)
Potassium: 4.5 mEq/L (ref 3.5–5.1)

## 2013-01-23 LAB — SURGICAL PCR SCREEN
MRSA, PCR: NEGATIVE
Staphylococcus aureus: NEGATIVE

## 2013-01-23 LAB — PROTIME-INR: Prothrombin Time: 12.4 seconds (ref 11.6–15.2)

## 2013-01-23 NOTE — Progress Notes (Signed)
Primary Physician - Dr. Kandy Garrison [facility] Does not have a cardiologist Had heart cath in past >5

## 2013-01-23 NOTE — Pre-Procedure Instructions (Signed)
DAVID TOWSON  01/23/2013   Your procedure is scheduled on:  Monday, June 9th  Report to [facility] Stay Center at 0700 AM. Come to main entrance "A" and go to east elevators up to 3rd floor. Check in at short stay desk.  Call this number if you have problems the morning of surgery: [phone]   Remember:   Do not eat food or drink liquids after midnight.   Take these medicines the morning of surgery with A SIP OF WATER: None  Stop fish oil and aspirin 5 days prior to surgery   Do not wear jewelry, make-up or nail polish.  Do not wear lotions, powders, or perfumes,deodorant.  Do not shave 48 hours prior to surgery.   Do not bring valuables to the hospital.  [facility] is not responsible for any belongings or valuables.  Contacts, dentures or bridgework may not be worn into surgery.  Leave suitcase in the car. After surgery it may be brought to your room.  For patients admitted to the hospital, checkout time is 11:00 AM the day of discharge.   Patients discharged the day of surgery will not be allowed to drive home.    Special Instructions: Shower using CHG 2 nights before surgery and the night before surgery.  If you shower the day of surgery use CHG.  Use special wash - you have one bottle of CHG for all showers.  You should use approximately 1/3 of the bottle for each shower.   Please read over the following fact sheets that you were given: Pain Booklet, Coughing and Deep Breathing, Blood Transfusion Information, Total Joint Packet, MRSA Information and Surgical Site Infection Prevention

## 2013-01-24 NOTE — H&P (Signed)
[name] is an 57 y.o. female.   Chief Complaint: right knee pain HPI: Patient presents with a chief complaint of bilateral knee pain that began several years ago but has been on a worsening trend.  She is status post right knee ACL reconstruction by another physician several years ago.  They used a graft from her contralateral knee.  Several years later she had a Fulkerson slide on the right knee by Dr. Renae Fickle.  She reports that her right knee did very well until recently.  She localizes most of her pain to the medial side and complains of associated catching and locking.  She reports that her left knee has also been more symptomatic recently but the pain is not as severe as it is the right knee.  She's been using over-the-counter Tylenol and Aleve for pain.  She had cortisone shots several years ago that provided her with temporary pain relief.  She weighed more than 300 pounds at one time but has worked hard on weight loss and now weighs 240 pounds.  She hopes to continue walking to lose more weight, but her knee pain is significantly limiting her activity at this point.  Past Medical History  Diagnosis Date  . Hypertension   . High cholesterol   . Fluid retention   . Hypokalemia   . Chronic back pain   . Pneumonia     hospitalized in March  . Arthritis   . Cancer 1992    cervical    Past Surgical History  Procedure Laterality Date  . Abdominal hysterectomy    . Cholecystectomy    . Knee surgery      right and left    No family history on file. Social History:  reports that she has been smoking Cigarettes.  She has a 21.5 pack-year smoking history. She does not have any smokeless tobacco history on file. She reports that  drinks alcohol. She reports that she does not use illicit drugs.  Allergies:  Allergies  Allergen Reactions  . Codeine Itching    No prescriptions prior to admission    Results for orders placed during the hospital encounter of 01/23/13 (from the past  48 hour(s))  SURGICAL PCR SCREEN     Status: None   Collection Time    01/23/13 12:59 PM      Result Value Range   MRSA, PCR NEGATIVE  NEGATIVE   Staphylococcus aureus NEGATIVE  NEGATIVE   Comment:            The Xpert SA Assay (FDA     approved for NASAL specimens     in patients over 54 years of age),     is one component of     a comprehensive surveillance     program.  Test performance has     been validated by [employer] for patients greater     than or equal to 59 year old.     It is not intended     to diagnose infection nor to     guide or monitor treatment.  URINALYSIS, ROUTINE W REFLEX MICROSCOPIC     Status: None   Collection Time    01/23/13 12:59 PM      Result Value Range   Color, Urine YELLOW  YELLOW   APPearance CLEAR  CLEAR   Specific Gravity, Urine 1.014  1.005 - 1.030   pH 6.0  5.0 - 8.0   Glucose, UA NEGATIVE  NEGATIVE mg/dL   Hgb urine dipstick NEGATIVE  NEGATIVE   Bilirubin Urine NEGATIVE  NEGATIVE   Ketones, ur NEGATIVE  NEGATIVE mg/dL   Protein, ur NEGATIVE  NEGATIVE mg/dL   Urobilinogen, UA 0.2  0.0 - 1.0 mg/dL   Nitrite NEGATIVE  NEGATIVE   Leukocytes, UA NEGATIVE  NEGATIVE   Comment: MICROSCOPIC NOT DONE ON URINES WITH NEGATIVE PROTEIN, BLOOD, LEUKOCYTES, NITRITE, OR GLUCOSE <1000 mg/dL.  APTT     Status: None   Collection Time    01/23/13  1:07 PM      Result Value Range   aPTT 29  24 - 37 seconds  BASIC METABOLIC PANEL     Status: Abnormal   Collection Time    01/23/13  1:07 PM      Result Value Range   Sodium 140  135 - 145 mEq/L   Potassium 4.5  3.5 - 5.1 mEq/L   Chloride 102  96 - 112 mEq/L   CO2 28  19 - 32 mEq/L   Glucose, Bld 116 (*) 70 - 99 mg/dL   BUN 19  6 - 23 mg/dL   Creatinine, Ser 4.69 (*) 0.50 - 1.10 mg/dL   Calcium 9.9  8.4 - 62.9 mg/dL   GFR calc non Af Amer 45 (*) >90 mL/min   GFR calc Af Amer 52 (*) >90 mL/min   Comment:            The eGFR has been calculated     using the CKD EPI equation.     This  calculation has not been     validated in all clinical     situations.     eGFR's persistently     <90 mL/min signify     possible Chronic Kidney Disease.  CBC WITH DIFFERENTIAL     Status: None   Collection Time    01/23/13  1:07 PM      Result Value Range   WBC 10.1  4.0 - 10.5 K/uL   RBC 4.40  3.87 - 5.11 MIL/uL   Hemoglobin 13.2  12.0 - 15.0 g/dL   HCT 52.8  41.3 - 24.4 %   MCV 90.2  78.0 - 100.0 fL   MCH 30.0  26.0 - 34.0 pg   MCHC 33.2  30.0 - 36.0 g/dL   RDW 01.0  27.2 - 53.6 %   Platelets 252  150 - 400 K/uL   Neutrophils Relative % 59  43 - 77 %   Neutro Abs 5.9  1.7 - 7.7 K/uL   Lymphocytes Relative 31  12 - 46 %   Lymphs Abs 3.1  0.7 - 4.0 K/uL   Monocytes Relative 7  3 - 12 %   Monocytes Absolute 0.7  0.1 - 1.0 K/uL   Eosinophils Relative 3  0 - 5 %   Eosinophils Absolute 0.3  0.0 - 0.7 K/uL   Basophils Relative 0  0 - 1 %   Basophils Absolute 0.0  0.0 - 0.1 K/uL  PROTIME-INR     Status: None   Collection Time    01/23/13  1:07 PM      Result Value Range   Prothrombin Time 12.4  11.6 - 15.2 seconds   INR 0.93  0.00 - 1.49  TYPE AND SCREEN     Status: None   Collection Time    01/23/13  1:08 PM      Result Value Range   ABO/RH(D) A POS  Antibody Screen POS     Sample Expiration 02/06/2013     Antibody Identification ANTI-M     PT AG Type NEGATIVE FOR M ANTIGEN     DAT, IgG NEG     Unit Number [id number]     Blood Component Type RED CELLS,LR     Unit division 00     Status of Unit ALLOCATED     Donor AG Type NEGATIVE FOR M ANTIGEN     Transfusion Status OK TO TRANSFUSE     Crossmatch Result COMPATIBLE     Unit Number [id number]     Blood Component Type RED CELLS,LR     Unit division 00     Status of Unit ALLOCATED     Donor AG Type NEGATIVE FOR M ANTIGEN     Transfusion Status OK TO TRANSFUSE     Crossmatch Result COMPATIBLE     No results found.  Review of Systems  Constitutional: Negative.   HENT: Negative.   Eyes: Negative.    Respiratory: Negative.   Cardiovascular: Negative.   Gastrointestinal: Negative.   Genitourinary: Negative.   Musculoskeletal: Positive for joint pain.  Skin: Negative.   Neurological: Negative.   Endo/Heme/Allergies: Negative.   Psychiatric/Behavioral: Negative.     There were no vitals taken for this visit. Physical Exam  Constitutional: She is oriented to person, place, and time. She appears well-developed and well-nourished.  HENT:  Head: Normocephalic.  Eyes: Pupils are equal, round, and reactive to light.  Cardiovascular: Intact distal pulses.   Respiratory: Effort normal.  Musculoskeletal:       Right knee: She exhibits decreased range of motion. Tenderness found.  Neurological: She is alert and oriented to person, place, and time.  Psychiatric: She has a normal mood and affect.     She is 5 feet 6 inches tall and weighs 240 pounds.  Exam of the knees demonstrates anterior scars consistent with her previous surgical procedures.  The left knee has full range of motion with no obvious effusion.  She is tender to palpation along the medial joint line.  The right knee range of motion is estimated to be 5-110.  Both knees have palpable crepitance with range of motion.  She is neurovascularly intact.  Xrays: 4 views of both knees demonstrate end-stage arthritis in the medial and patellofemoral compartments of the right knee.  She has hardware consistent with her ACL reconstruction and Fulkerson slide.  There are 5 screws total.  The left knee has approaching end-stage arthritis in the medial compartment.  Assessment/Plan A: End-stage arthritis of the right knee  We have discussed the options in detail with Ms. Iseman today.  At this point she is not interested in further  conservative treatment.  Her pain wakes her from sleep at night and makes her activities of daily living very difficult.  She would like to proceed with right total knee arthroplasty.  She was given a  prescription for Norco 5/325 mg to use as needed for pain.  She will talk to [place] today about scheduling.  Keone Kamer M. 01/24/2013, 9:02 AM

## 2013-01-24 NOTE — Progress Notes (Signed)
Anesthesia chart review: Patient is a 57 year old female scheduled for right knee arthroplasty with hardware removal on 01/29/2013 by Dr. Turner Daniels.  History include obesity, hypertension, hypercholesterolemia, fluid retention, cervical cancer in 1992 with history of hysterectomy, smoking, cholecystectomy, PNA 10/2012. PCP is listed as Gracelyn Nurse, PA-C.  EKG on 01/23/13 showed NSR, poor r wave progression, borderline LAD, non-specific T wave abnormality, prolonged QT. Overall, her EKG appears stable since 03/06/10.  Cardiac cath on 04/07/04 showed no significant CAD (mild coronary ectasia of proximal LAD and RCA with no evidence of atherosclerotic stenosis) with hyperdynamic LV function, possible hypertrophic cardiomyopathy, but echo on 04/08/04 showed overall LV systolic function at the lower limits of normal, EF 50-55%, normal LV wall thickness and no evidence of significant LVH.  No significant valvular disease.  CXR on 11/22/12 showed: Improved right basilar consolidation with mild bibasilar atelectasis and chronic bronchitic changes.   Preoperative labs noted.  She will be evaluated by her assigned anesthesiologist on the day of surgery, but if no acute cardiopulmonary symptoms then I would anticipate that she could proceed as planned.  Velna Ochs [facility] Short Stay Center/Anesthesiology Phone [phone] 01/24/2013 12:27 PM

## 2013-01-28 DIAGNOSIS — M1711 Unilateral primary osteoarthritis, right knee: Secondary | ICD-10-CM | POA: Diagnosis present

## 2013-01-28 MED ORDER — CEFAZOLIN SODIUM-DEXTROSE 2-3 GM-% IV SOLR
2.0000 g | INTRAVENOUS | Status: AC
  Administered 2013-01-29: 2 g via INTRAVENOUS
  Filled 2013-01-28: qty 50

## 2013-01-29 ENCOUNTER — Encounter (HOSPITAL_COMMUNITY): Payer: Self-pay | Admitting: Vascular Surgery

## 2013-01-29 ENCOUNTER — Ambulatory Visit (HOSPITAL_COMMUNITY): Payer: BC Managed Care – PPO | Admitting: Certified Registered Nurse Anesthetist

## 2013-01-29 ENCOUNTER — Encounter (HOSPITAL_COMMUNITY)
Admission: RE | Disposition: A | Discharge status: Home / Self Care | Payer: Self-pay | Source: Ambulatory Visit | Attending: Orthopedic Surgery

## 2013-01-29 ENCOUNTER — Encounter (HOSPITAL_COMMUNITY): Payer: Self-pay | Admitting: *Deleted

## 2013-01-29 ENCOUNTER — Inpatient Hospital Stay (HOSPITAL_COMMUNITY)
Admission: RE | Admit: 2013-01-29 | Discharge: 2013-01-30 | DRG: 209 | Disposition: A | Discharge status: Home / Self Care | Payer: BC Managed Care – PPO | Source: Ambulatory Visit | Attending: Orthopedic Surgery | Admitting: Orthopedic Surgery

## 2013-01-29 DIAGNOSIS — E876 Hypokalemia: Secondary | ICD-10-CM | POA: Diagnosis not present

## 2013-01-29 DIAGNOSIS — Y929 Unspecified place or not applicable: Secondary | ICD-10-CM

## 2013-01-29 DIAGNOSIS — I10 Essential (primary) hypertension: Secondary | ICD-10-CM | POA: Diagnosis present

## 2013-01-29 DIAGNOSIS — G8929 Other chronic pain: Secondary | ICD-10-CM | POA: Diagnosis present

## 2013-01-29 DIAGNOSIS — M549 Dorsalgia, unspecified: Secondary | ICD-10-CM | POA: Diagnosis present

## 2013-01-29 DIAGNOSIS — Y838 Other surgical procedures as the cause of abnormal reaction of the patient, or of later complication, without mention of misadventure at the time of the procedure: Secondary | ICD-10-CM | POA: Diagnosis present

## 2013-01-29 DIAGNOSIS — E7800 Pure hypercholesterolemia, unspecified: Secondary | ICD-10-CM | POA: Diagnosis present

## 2013-01-29 DIAGNOSIS — M1711 Unilateral primary osteoarthritis, right knee: Secondary | ICD-10-CM

## 2013-01-29 DIAGNOSIS — Z9889 Other specified postprocedural states: Secondary | ICD-10-CM

## 2013-01-29 DIAGNOSIS — Z7982 Long term (current) use of aspirin: Secondary | ICD-10-CM

## 2013-01-29 DIAGNOSIS — M1710 Unilateral primary osteoarthritis, unspecified knee: Principal | ICD-10-CM | POA: Diagnosis present

## 2013-01-29 DIAGNOSIS — F17200 Nicotine dependence, unspecified, uncomplicated: Secondary | ICD-10-CM | POA: Diagnosis present

## 2013-01-29 HISTORY — PX: TOTAL KNEE ARTHROPLASTY: SHX125

## 2013-01-29 LAB — TYPE AND SCREEN
ABO/RH(D): A POS
Antibody Screen: POSITIVE

## 2013-01-29 SURGERY — ARTHROPLASTY, KNEE, TOTAL
Anesthesia: General | Site: Knee | Laterality: Right | Wound class: Clean

## 2013-01-29 MED ORDER — OXYCODONE HCL 5 MG PO TABS: 5.0000 mg | ORAL_TABLET | Freq: Once | ORAL | Status: DC | PRN

## 2013-01-29 MED ORDER — DEXTROSE-NACL 5-0.45 % IV SOLN: INTRAVENOUS | Status: DC

## 2013-01-29 MED ORDER — OXYCODONE HCL 5 MG PO TABS
5.0000 mg | ORAL_TABLET | ORAL | Status: DC | PRN
  Administered 2013-01-29 – 2013-01-30 (×8): 10 mg via ORAL
  Filled 2013-01-29 (×7): qty 2

## 2013-01-29 MED ORDER — METHOCARBAMOL 500 MG PO TABS
500.0000 mg | ORAL_TABLET | Freq: Four times a day (QID) | ORAL | Status: DC | PRN
  Administered 2013-01-29 – 2013-01-30 (×2): 500 mg via ORAL
  Filled 2013-01-29 (×2): qty 1

## 2013-01-29 MED ORDER — ASPIRIN EC 325 MG PO TBEC
325.0000 mg | DELAYED_RELEASE_TABLET | Freq: Every day | ORAL | Status: DC
  Administered 2013-01-30: 325 mg via ORAL
  Filled 2013-01-29 (×2): qty 1

## 2013-01-29 MED ORDER — OXYCODONE HCL 5 MG/5ML PO SOLN
5.0000 mg | Freq: Once | ORAL | Status: DC | PRN
Start: 2013-01-29 — End: 2013-01-29

## 2013-01-29 MED ORDER — HYDROMORPHONE HCL PF 1 MG/ML IJ SOLN
0.2500 mg | INTRAMUSCULAR | Status: DC | PRN
  Administered 2013-01-29: 0.5 mg via INTRAVENOUS

## 2013-01-29 MED ORDER — ACETAMINOPHEN 650 MG RE SUPP: 650.0000 mg | Freq: Four times a day (QID) | RECTAL | Status: DC | PRN

## 2013-01-29 MED ORDER — ALUM & MAG HYDROXIDE-SIMETH 200-200-20 MG/5ML PO SUSP: 30.0000 mL | ORAL | Status: DC | PRN

## 2013-01-29 MED ORDER — SODIUM CHLORIDE 0.9 % IR SOLN
Status: DC | PRN
  Administered 2013-01-29: 3000 mL

## 2013-01-29 MED ORDER — SODIUM CHLORIDE 0.9 % IJ SOLN
INTRAMUSCULAR | Status: DC | PRN
  Administered 2013-01-29: 10 mL via INTRAVENOUS

## 2013-01-29 MED ORDER — METOCLOPRAMIDE HCL 5 MG/ML IJ SOLN: 5.0000 mg | Freq: Three times a day (TID) | INTRAMUSCULAR | Status: DC | PRN

## 2013-01-29 MED ORDER — ONDANSETRON HCL 4 MG PO TABS: 4.0000 mg | ORAL_TABLET | Freq: Four times a day (QID) | ORAL | Status: DC | PRN

## 2013-01-29 MED ORDER — LIDOCAINE HCL (CARDIAC) 20 MG/ML IV SOLN
INTRAVENOUS | Status: DC | PRN
  Administered 2013-01-29: 100 mg via INTRAVENOUS

## 2013-01-29 MED ORDER — BUPIVACAINE LIPOSOME 1.3 % IJ SUSP
INTRAMUSCULAR | Status: DC | PRN
  Administered 2013-01-29: 20 mL

## 2013-01-29 MED ORDER — PROPOFOL 10 MG/ML IV BOLUS
INTRAVENOUS | Status: DC | PRN
  Administered 2013-01-29: 150 mg via INTRAVENOUS

## 2013-01-29 MED ORDER — MIDAZOLAM HCL 5 MG/5ML IJ SOLN
INTRAMUSCULAR | Status: DC | PRN
  Administered 2013-01-29: 2 mg via INTRAVENOUS

## 2013-01-29 MED ORDER — CHLORHEXIDINE GLUCONATE 4 % EX LIQD: 60.0000 mL | Freq: Once | CUTANEOUS | Status: DC

## 2013-01-29 MED ORDER — FLUTICASONE PROPIONATE 50 MCG/ACT NA SUSP
1.0000 | Freq: Two times a day (BID) | NASAL | Status: DC
  Administered 2013-01-30: 1 via NASAL
  Filled 2013-01-29 (×2): qty 16

## 2013-01-29 MED ORDER — BUPIVACAINE-EPINEPHRINE PF 0.5-1:200000 % IJ SOLN
INTRAMUSCULAR | Status: AC
  Filled 2013-01-29: qty 30

## 2013-01-29 MED ORDER — POTASSIUM GLUCONATE 550 MG PO TABS: 1.0000 | ORAL_TABLET | Freq: Every day | ORAL | Status: DC | PRN

## 2013-01-29 MED ORDER — HYDROMORPHONE HCL PF 1 MG/ML IJ SOLN
INTRAMUSCULAR | Status: AC
  Administered 2013-01-29: 0.5 mg via INTRAVENOUS
  Filled 2013-01-29: qty 1

## 2013-01-29 MED ORDER — DOCUSATE SODIUM 100 MG PO CAPS
100.0000 mg | ORAL_CAPSULE | Freq: Two times a day (BID) | ORAL | Status: DC
  Administered 2013-01-29 – 2013-01-30 (×3): 100 mg via ORAL
  Filled 2013-01-29 (×2): qty 1

## 2013-01-29 MED ORDER — GEMFIBROZIL 600 MG PO TABS
600.0000 mg | ORAL_TABLET | Freq: Two times a day (BID) | ORAL | Status: DC
  Administered 2013-01-29 – 2013-01-30 (×2): 600 mg via ORAL
  Filled 2013-01-29 (×4): qty 1

## 2013-01-29 MED ORDER — ONDANSETRON HCL 4 MG/2ML IJ SOLN
INTRAMUSCULAR | Status: AC
  Filled 2013-01-29: qty 2

## 2013-01-29 MED ORDER — BUPIVACAINE-EPINEPHRINE (PF) 0.5% -1:200000 IJ SOLN
INTRAMUSCULAR | Status: AC
  Filled 2013-01-29: qty 10

## 2013-01-29 MED ORDER — BUPIVACAINE LIPOSOME 1.3 % IJ SUSP
20.0000 mL | INTRAMUSCULAR | Status: DC
  Filled 2013-01-29: qty 20

## 2013-01-29 MED ORDER — FUROSEMIDE 20 MG PO TABS
20.0000 mg | ORAL_TABLET | Freq: Every day | ORAL | Status: DC
  Administered 2013-01-29 – 2013-01-30 (×2): 20 mg via ORAL
  Filled 2013-01-29 (×2): qty 1

## 2013-01-29 MED ORDER — DIPHENHYDRAMINE HCL 12.5 MG/5ML PO ELIX: 12.5000 mg | ORAL_SOLUTION | ORAL | Status: DC | PRN

## 2013-01-29 MED ORDER — MEPERIDINE HCL 25 MG/ML IJ SOLN: 6.2500 mg | INTRAMUSCULAR | Status: DC | PRN

## 2013-01-29 MED ORDER — ONDANSETRON HCL 4 MG/2ML IJ SOLN
INTRAMUSCULAR | Status: DC | PRN
  Administered 2013-01-29: 4 mg via INTRAVENOUS

## 2013-01-29 MED ORDER — CEFUROXIME SODIUM 1.5 G IJ SOLR
INTRAMUSCULAR | Status: AC
  Filled 2013-01-29: qty 1.5

## 2013-01-29 MED ORDER — KCL IN DEXTROSE-NACL 20-5-0.45 MEQ/L-%-% IV SOLN
INTRAVENOUS | Status: DC
  Administered 2013-01-29: 16:00:00 via INTRAVENOUS
  Filled 2013-01-29 (×6): qty 1000

## 2013-01-29 MED ORDER — ACETAMINOPHEN 325 MG PO TABS: 650.0000 mg | ORAL_TABLET | Freq: Four times a day (QID) | ORAL | Status: DC | PRN

## 2013-01-29 MED ORDER — PHENOL 1.4 % MT LIQD: 1.0000 | OROMUCOSAL | Status: DC | PRN

## 2013-01-29 MED ORDER — HYDROMORPHONE HCL PF 1 MG/ML IJ SOLN
INTRAMUSCULAR | Status: DC | PRN
  Administered 2013-01-29 (×2): 0.5 mg via INTRAVENOUS

## 2013-01-29 MED ORDER — ZOLPIDEM TARTRATE 5 MG PO TABS: 5.0000 mg | ORAL_TABLET | Freq: Every evening | ORAL | Status: DC | PRN

## 2013-01-29 MED ORDER — METHOCARBAMOL 100 MG/ML IJ SOLN
500.0000 mg | Freq: Four times a day (QID) | INTRAVENOUS | Status: DC | PRN
  Filled 2013-01-29: qty 5

## 2013-01-29 MED ORDER — BUPIVACAINE-EPINEPHRINE PF 0.5-1:200000 % IJ SOLN
INTRAMUSCULAR | Status: DC | PRN
  Administered 2013-01-29: 30 mL

## 2013-01-29 MED ORDER — CEFUROXIME SODIUM 1.5 G IJ SOLR
INTRAMUSCULAR | Status: DC | PRN
  Administered 2013-01-29: 1.5 g

## 2013-01-29 MED ORDER — LACTATED RINGERS IV SOLN
INTRAVENOUS | Status: DC | PRN
  Administered 2013-01-29 (×2): via INTRAVENOUS

## 2013-01-29 MED ORDER — ONDANSETRON HCL 4 MG/2ML IJ SOLN: 4.0000 mg | Freq: Four times a day (QID) | INTRAMUSCULAR | Status: DC | PRN

## 2013-01-29 MED ORDER — METOCLOPRAMIDE HCL 10 MG PO TABS: 5.0000 mg | ORAL_TABLET | Freq: Three times a day (TID) | ORAL | Status: DC | PRN

## 2013-01-29 MED ORDER — FENTANYL CITRATE 0.05 MG/ML IJ SOLN
INTRAMUSCULAR | Status: DC | PRN
  Administered 2013-01-29: 50 ug via INTRAVENOUS
  Administered 2013-01-29: 25 ug via INTRAVENOUS
  Administered 2013-01-29 (×4): 50 ug via INTRAVENOUS
  Administered 2013-01-29: 25 ug via INTRAVENOUS
  Administered 2013-01-29 (×2): 50 ug via INTRAVENOUS
  Administered 2013-01-29: 25 ug via INTRAVENOUS
  Administered 2013-01-29: 50 ug via INTRAVENOUS
  Administered 2013-01-29: 25 ug via INTRAVENOUS

## 2013-01-29 MED ORDER — ONDANSETRON HCL 4 MG/2ML IJ SOLN
4.0000 mg | Freq: Once | INTRAMUSCULAR | Status: AC | PRN
  Administered 2013-01-29: 4 mg via INTRAVENOUS

## 2013-01-29 MED ORDER — MENTHOL 3 MG MT LOZG: 1.0000 | LOZENGE | OROMUCOSAL | Status: DC | PRN

## 2013-01-29 MED ORDER — 0.9 % SODIUM CHLORIDE (POUR BTL) OPTIME
TOPICAL | Status: DC | PRN
  Administered 2013-01-29: 1000 mL

## 2013-01-29 MED ORDER — HYDROMORPHONE HCL PF 1 MG/ML IJ SOLN
0.5000 mg | INTRAMUSCULAR | Status: DC | PRN
Start: 2013-01-29 — End: 2013-01-30
  Administered 2013-01-29 (×2): 1 mg via INTRAVENOUS
  Filled 2013-01-29 (×2): qty 1

## 2013-01-29 MED ORDER — LISINOPRIL 20 MG PO TABS
20.0000 mg | ORAL_TABLET | Freq: Every day | ORAL | Status: DC
  Administered 2013-01-30: 20 mg via ORAL
  Filled 2013-01-29: qty 1

## 2013-01-29 SURGICAL SUPPLY — 57 items
BANDAGE ELASTIC 6 VELCRO ST LF (GAUZE/BANDAGES/DRESSINGS) ×1 IMPLANT
BANDAGE ESMARK 6X9 LF (GAUZE/BANDAGES/DRESSINGS) ×1 IMPLANT
BLADE SAG 18X100X1.27 (BLADE) ×2 IMPLANT
BLADE SAW SGTL 13X75X1.27 (BLADE) ×2 IMPLANT
BLADE SURG ROTATE 9660 (MISCELLANEOUS) IMPLANT
BNDG CMPR 9X6 STRL LF SNTH (GAUZE/BANDAGES/DRESSINGS) ×1
BNDG CMPR MED 10X6 ELC LF (GAUZE/BANDAGES/DRESSINGS) ×1
BNDG ELASTIC 6X10 VLCR STRL LF (GAUZE/BANDAGES/DRESSINGS) ×2 IMPLANT
BNDG ESMARK 6X9 LF (GAUZE/BANDAGES/DRESSINGS) ×2
BOWL SMART MIX CTS (DISPOSABLE) ×2 IMPLANT
CAPT RP KNEE ×1 IMPLANT
CEMENT HV SMART SET (Cement) ×4 IMPLANT
CLOTH BEACON ORANGE TIMEOUT ST (SAFETY) ×2 IMPLANT
COVER SURGICAL LIGHT HANDLE (MISCELLANEOUS) ×2 IMPLANT
CUFF TOURNIQUET SINGLE 34IN LL (TOURNIQUET CUFF) IMPLANT
CUFF TOURNIQUET SINGLE 44IN (TOURNIQUET CUFF) IMPLANT
DRAPE EXTREMITY T 121X128X90 (DRAPE) ×2 IMPLANT
DRAPE U-SHAPE 47X51 STRL (DRAPES) ×2 IMPLANT
DURAPREP 26ML APPLICATOR (WOUND CARE) ×2 IMPLANT
ELECT REM PT RETURN 9FT ADLT (ELECTROSURGICAL) ×2
ELECTRODE REM PT RTRN 9FT ADLT (ELECTROSURGICAL) ×1 IMPLANT
EVACUATOR 1/8 PVC DRAIN (DRAIN) ×2 IMPLANT
GAUZE XEROFORM 1X8 LF (GAUZE/BANDAGES/DRESSINGS) ×2 IMPLANT
GLOVE BIO SURGEON STRL SZ7 (GLOVE) ×2 IMPLANT
GLOVE BIO SURGEON STRL SZ7.5 (GLOVE) ×2 IMPLANT
GLOVE BIOGEL PI IND STRL 7.0 (GLOVE) ×1 IMPLANT
GLOVE BIOGEL PI IND STRL 8 (GLOVE) ×1 IMPLANT
GLOVE BIOGEL PI INDICATOR 7.0 (GLOVE) ×1
GLOVE BIOGEL PI INDICATOR 8 (GLOVE) ×1
GOWN PREVENTION PLUS XLARGE (GOWN DISPOSABLE) ×2 IMPLANT
GOWN STRL NON-REIN LRG LVL3 (GOWN DISPOSABLE) ×4 IMPLANT
HANDPIECE INTERPULSE COAX TIP (DISPOSABLE) ×2
HOOD PEEL AWAY FACE SHEILD DIS (HOOD) ×6 IMPLANT
KIT BASIN OR (CUSTOM PROCEDURE TRAY) ×2 IMPLANT
KIT ROOM TURNOVER OR (KITS) ×2 IMPLANT
MANIFOLD NEPTUNE II (INSTRUMENTS) ×2 IMPLANT
NDL 18GX1X1/2 (RX/OR ONLY) (NEEDLE) IMPLANT
NEEDLE 18GX1X1/2 (RX/OR ONLY) (NEEDLE) ×2 IMPLANT
NS IRRIG 1000ML POUR BTL (IV SOLUTION) ×2 IMPLANT
PACK TOTAL JOINT (CUSTOM PROCEDURE TRAY) ×2 IMPLANT
PAD ARMBOARD 7.5X6 YLW CONV (MISCELLANEOUS) ×4 IMPLANT
PADDING CAST COTTON 6X4 STRL (CAST SUPPLIES) ×2 IMPLANT
SET HNDPC FAN SPRY TIP SCT (DISPOSABLE) ×1 IMPLANT
SPONGE GAUZE 4X4 12PLY (GAUZE/BANDAGES/DRESSINGS) ×3 IMPLANT
STAPLER VISISTAT 35W (STAPLE) ×2 IMPLANT
SUCTION FRAZIER TIP 10 FR DISP (SUCTIONS) ×2 IMPLANT
SUT VIC AB 0 CTX 36 (SUTURE) ×2
SUT VIC AB 0 CTX36XBRD ANTBCTR (SUTURE) ×1 IMPLANT
SUT VIC AB 1 CTX 36 (SUTURE) ×2
SUT VIC AB 1 CTX36XBRD ANBCTR (SUTURE) ×1 IMPLANT
SUT VIC AB 2-0 CT1 27 (SUTURE) ×2
SUT VIC AB 2-0 CT1 TAPERPNT 27 (SUTURE) ×1 IMPLANT
SYR 30ML SLIP (SYRINGE) ×1 IMPLANT
TOWEL OR 17X24 6PK STRL BLUE (TOWEL DISPOSABLE) ×2 IMPLANT
TOWEL OR 17X26 10 PK STRL BLUE (TOWEL DISPOSABLE) ×2 IMPLANT
TRAY FOLEY CATH 14FR (SET/KITS/TRAYS/PACK) ×1 IMPLANT
WATER STERILE IRR 1000ML POUR (IV SOLUTION) ×4 IMPLANT

## 2013-01-29 NOTE — Interval H&P Note (Signed)
History and Physical Interval Note:  01/29/2013 9:04 AM  [name]  has presented today for surgery, with the diagnosis of RIGHT KNEE DEGENERATIVE JOINT DISEASE, RETAINED HARDWARE  The various methods of treatment have been discussed with the patient and family. After consideration of risks, benefits and other options for treatment, the patient has consented to  Procedure(s) with comments: TOTAL KNEE ARTHROPLASTY (Right) - DEPUY/SIGMA RP, SYNTHESE SCREWDRIVERS as a surgical intervention .  The patient's history has been reviewed, patient examined, no change in status, stable for surgery.  I have reviewed the patient's chart and labs.  Questions were answered to the patient's satisfaction.     [name]

## 2013-01-29 NOTE — Preoperative (Signed)
Beta Blockers   Reason not to administer Beta Blockers:Not Applicable 

## 2013-01-29 NOTE — Op Note (Signed)
PATIENT ID:      [name]  MRN:     [id number] DOB/AGE:    Feb 02, 1956 / 57 y.o.       OPERATIVE REPORT    DATE OF PROCEDURE:  01/29/2013       PREOPERATIVE DIAGNOSIS:   RIGHT KNEE DEGENERATIVE JOINT DISEASE, RETAINED HARDWARE      There is no weight on file to calculate BMI.                                                        POSTOPERATIVE DIAGNOSIS:   RIGHT KNEE DEGENERATIVE JOINT DISEASE, RETAINED HARDWARE                                                                      PROCEDURE:  Procedure(s): TOTAL KNEE ARTHROPLASTY WITH HARDWARE REMOVAL Using Depuy Sigma RP implants #5R Femur, #4Tibia, 10mm sigma RP bearing, 38 Patella     SURGEON: Ruthel Martine J    ASSISTANT:   Trinidad Curet PA-S  (Present and scrubbed throughout the case, critical for assistance with exposure, retraction, instrumentation, and closure.)         ANESTHESIA: GET Exparel  DRAINS: foley, 2 medium hemovac in knee   TOURNIQUET TIME:   COMPLICATIONS:  None     SPECIMENS: None   INDICATIONS FOR PROCEDURE: The patient has  RIGHT KNEE DEGENERATIVE JOINT DISEASE, RETAINED HARDWARE, varus deformities, XR shows bone on bone arthritis. Patient has failed all conservative measures including anti-inflammatory medicines, narcotics, attempts at  exercise and weight loss, cortisone injections and viscosupplementation.  Risks and benefits of surgery have been discussed, questions answered.   DESCRIPTION OF PROCEDURE: The patient identified by armband, received  IV antibiotics, in the holding area at [facility]. Patient taken to the operating room, appropriate anesthetic  monitors were attached, and general endotracheal anesthesia induced with  the patient in supine position, Foley catheter was inserted. Tourniquet  applied high to the operative thigh. Lateral post and foot positioner  applied to the table, the lower extremity was then prepped and draped  in usual sterile fashion from the ankle to the  tourniquet. Time-out procedure was performed. The limb was wrapped with an Esmarch bandage and the tourniquet inflated to 350 mmHg. We began the operation by making the anterior midline incision starting at handbreadth above the patella going over the patella 1 cm medial to and  4 cm distal to the tibial tubercle. Small bleeders in the skin and the  subcutaneous tissue identified and cauterized. We identified and removed the Fulkerson Slide 6.5 mm screws distal to the tib tubercle. Transverse retinaculum was incised and reflected medially and a medial parapatellar arthrotomy was accomplished. the patella was everted and theprepatellar fat pad resected. The superficial medial collateral  ligament was then elevated from anterior to posterior along the proximal  flare of the tibia and anterior half of the menisci resected. The knee was hyperflexed exposing bone on bone arthritis. Peripheral and notch osteophytes as well as the cruciate ligaments were then resected. We continued to  work our  way around posteriorly along the proximal tibia, and externally  rotated the tibia subluxing it out from underneath the femur. A McHale  retractor was placed through the notch and a lateral Hohmann retractor  placed, and we then drilled through the proximal tibia in line with the  axis of the tibia followed by an intramedullary guide rod and 2-degree  posterior slope cutting guide. The tibial cutting guide was pinned into place  allowing resection of 5 mm of bone medially and about 10 mm of bone  laterally because of her varus deformity. We visualized and remove the ACL tibial screws from the proximal  Tibia. Satisfied with the tibial resection, we then  entered the distal femur 2 mm anterior to the PCL origin with the  intramedullary guide rod and applied the distal femoral cutting guide  set at 11mm, with 5 degrees of valgus. This was pinned along the  epicondylar axis. At this point, the distal femoral cut was  accomplished without difficulty. We then sized for a #5R femoral component and pinned the guide in 3 degrees of external rotation.The chamfer cutting guide was pinned into place. The anterior, posterior, and chamfer cuts were accomplished without difficulty followed by  the Sigma RP box cutting guide and the box cut. We also removed posterior osteophytes from the posterior femoral condyles. At this  time, the knee was brought into full extension. We checked our  extension and flexion gaps and found them symmetric at 10mm.  The patella thickness measured at 25 mm. We set the cutting guide at 15 and removed the posterior 10 mm  of the patella, sized for a 38 button and drilled the lollipop. The knee  was then once again hyperflexed exposing the proximal tibia. We sized for a #4 tibial base plate, applied the smokestack and the conical reamer followed by the the Delta fin keel punch. We then hammered into place the Sigma RP trial femoral component, inserted a 10-mm trial bearing, trial patellar button, and took the knee through range of motion from 0-130 degrees. No thumb pressure was required for patellar  tracking. At this point, all trial components were removed, a double batch of DePuy HV cement with 1500 mg of Zinacef was mixed and applied to all bony metallic mating surfaces except for the posterior condyles of the femur itself. In order, we  hammered into place the tibial tray and removed excess cement, the femoral component and removed excess cement, a 10-mm Sigma RP bearing  was inserted, and the knee brought to full extension with compression.  The patellar button was clamped into place, and excess cement  removed. While the cement cured the wound was irrigated out with normal saline solution pulse lavage, and medium Hemovac drains were placed from an anterolateral  approach. Ligament stability and patellar tracking were checked and found to be excellent. The parapatellar arthrotomy was closed  with  running #1 Vicryl suture. The subcutaneous tissue with 0 and 2-0 undyed  Vicryl suture, and the skin with skin staples. A dressing of Xeroform,  4 x 4, dressing sponges, Webril, and Ace wrap applied. The patient  awakened, extubated, and taken to recovery room without difficulty.   Angles Trevizo J 01/29/2013, 11:21 AM

## 2013-01-29 NOTE — Progress Notes (Signed)
Orthopedic Tech Progress Note Patient Details:  [name] [facility] 1955/11/04 [id number] Applied CPM to Rt. Knee.  Left Footsie Roll with pt.'s nurse. CPM Right Knee CPM Right Knee: On Right Knee Flexion (Degrees): 60 Right Knee Extension (Degrees): 0   [name] 01/29/2013, 1:04 PM

## 2013-01-29 NOTE — Transfer of Care (Signed)
Immediate Anesthesia Transfer of Care Note  Patient: [name]  Procedure(s) Performed: Procedure(s) with comments: TOTAL KNEE ARTHROPLASTY WITH HARDWARE REMOVAL (Right) - DEPUY/SIGMA RP, SYNTHES SCREWDRIVERS  Patient Location: PACU  Anesthesia Type:General  Level of Consciousness: awake, alert  and oriented  Airway & Oxygen Therapy: Patient Spontanous Breathing and Patient connected to face mask oxygen  Post-op Assessment: Report given to PACU RN, Post -op Vital signs reviewed and stable and Patient moving all extremities  Post vital signs: Reviewed and stable  Complications: No apparent anesthesia complications

## 2013-01-29 NOTE — Anesthesia Preprocedure Evaluation (Addendum)
Anesthesia Evaluation  Patient identified by MRN, date of birth, ID band Patient awake    Reviewed: Allergy & Precautions, H&P , NPO status , Patient's Chart, lab work & pertinent test results  History of Anesthesia Complications Negative for: history of anesthetic complications  Airway Mallampati: I TM Distance: >3 FB Neck ROM: Full    Dental  (+) Edentulous Upper, Edentulous Lower and Dental Advisory Given   Pulmonary Current Smoker,          Cardiovascular hypertension, Pt. on medications     Neuro/Psych    GI/Hepatic   Endo/Other    Renal/GU      Musculoskeletal   Abdominal   Peds  Hematology   Anesthesia Other Findings   Reproductive/Obstetrics                          Anesthesia Physical Anesthesia Plan  ASA: II  Anesthesia Plan: General   Post-op Pain Management:    Induction: Intravenous  Airway Management Planned: LMA  Additional Equipment:   Intra-op Plan:   Post-operative Plan: Extubation in OR  Informed Consent: I have reviewed the patients History and Physical, chart, labs and discussed the procedure including the risks, benefits and alternatives for the proposed anesthesia with the patient or authorized representative who has indicated his/her understanding and acceptance.     Plan Discussed with: CRNA and Surgeon  Anesthesia Plan Comments:         Anesthesia Quick Evaluation

## 2013-01-29 NOTE — Evaluation (Signed)
Physical Therapy Evaluation Patient Details Name: [name] MRN: [id number] DOB: May 22, 1956 Today's Date: 01/29/2013 Time: 4696-2952 PT Time Calculation (min): 28 min  PT Assessment / Plan / Recommendation Clinical Impression  Pt is a 57 y.o. female s/p R TKA POD#0. Pt presents with ROM and strength deficits, mobility deficits and decreased independence with gt/transfers. WOuld benefit form skilled PT to maximize functional mobility and ensure safe transition home with HHPT and intermittent (A).     PT Assessment  Patient needs continued PT services    Follow Up Recommendations  Home health PT;Supervision - Intermittent;Supervision for mobility/OOB    Does the patient have the potential to tolerate intense rehabilitation      Barriers to Discharge None      Equipment Recommendations  Rolling walker with 5" wheels;Other (comment) (3 in 1 commode)    Recommendations for Other Services OT consult   Frequency 7X/week    Precautions / Restrictions Precautions Precautions: Knee;Fall Restrictions Weight Bearing Restrictions: Yes RLE Weight Bearing: Weight bearing as tolerated   Pertinent Vitals/Pain Denies pain; states she feels better than before surgery.      Mobility  Bed Mobility Bed Mobility: Supine to Sit;Sitting - Scoot to Edge of Bed Supine to Sit: 4: Min guard;HOB elevated;With rails Sitting - Scoot to Edge of Bed: 5: Supervision Details for Bed Mobility Assistance: required increased time to complete task due to pain; required min guard to guide and (A) R LE to EOB Transfers Transfers: Stand to Sit;Sit to Stand Sit to Stand: 4: Min assist;From bed;With upper extremity assist;From elevated surface Stand to Sit: 4: Min assist;To chair/3-in-1;With armrests;With upper extremity assist Details for Transfer Assistance: required min (A) to transition to upright postiion; verbal cues for hand placement and safety with RW; required cues to control descent to chair and  not flop. Ambulation/Gait Ambulation/Gait Assistance: 4: Min guard Ambulation Distance (Feet): 50 Feet Assistive device: Rolling walker Ambulation/Gait Assistance Details: verbal cues for gt sequencing, upright posture and safety with RW; pt has tendency to let bil LEs get ahead of RW; pt became dizzy with turn but insisted on continuing walking, stated the dizziniess subsided quickly  Gait Pattern: Step-to pattern;Decreased step length - left;Decreased stance time - right;Narrow base of support;Trunk flexed Gait velocity: decreased due to pain  Stairs: No Wheelchair Mobility Wheelchair Mobility: No    Exercises Total Joint Exercises Ankle Circles/Pumps: AROM;Both;10 reps;Supine Heel Slides: AAROM;Right;5 reps;Seated Long Arc Quad: AAROM;Right;10 reps;Other (comment) (required (A) to complete full ROM) Knee Flexion: AAROM;Right;5 reps;Seated Goniometric ROM: AROM 6 to 50 degrees flex limited by pain    PT Diagnosis: Difficulty walking;Acute pain  PT Problem List: Decreased strength;Decreased range of motion;Decreased balance;Decreased mobility;Decreased knowledge of use of DME;Decreased safety awareness;Pain PT Treatment Interventions:     PT Goals Acute Rehab PT Goals PT Goal Formulation: With patient Time For Goal Achievement: 02/05/13 Potential to Achieve Goals: Good Pt will go Supine/Side to Sit: with modified independence PT Goal: Supine/Side to Sit - Progress: Goal set today Pt will go Sit to Supine/Side: with modified independence PT Goal: Sit to Supine/Side - Progress: Goal set today Pt will go Sit to Stand: with modified independence PT Goal: Sit to Stand - Progress: Goal set today Pt will go Stand to Sit: with modified independence PT Goal: Stand to Sit - Progress: Goal set today Pt will Ambulate: >150 feet;with modified independence;with rolling walker PT Goal: Ambulate - Progress: Goal set today Pt will Perform Home Exercise Program: Independently PT Goal: Perform  Home Exercise Program - Progress: Goal set today  Visit Information  Last PT Received On: 01/29/13    Subjective Data  Subjective: pt lying supine in CPM; eager to get OOB; agreeable to therapy  Patient Stated Goal: home as soon as possible    Prior Functioning  Home Living Lives With: Family Available Help at Discharge: Family;Available PRN/intermittently Type of Home: House Home Access: Ramped entrance Home Layout: Two level Alternate Level Stairs-Number of Steps:  (does not go to second floor at all ) Bathroom Shower/Tub: Health visitor: Standard Bathroom Accessibility: Yes How Accessible: Accessible via walker Home Adaptive Equipment: Shower chair without back;Grab bars around toilet;Grab bars in shower Prior Function Level of Independence: Independent Able to Take Stairs?: Yes Driving: Yes 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; 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 L LE Left Lower Extremity Assessment LLE ROM/Strength/Tone: WFL for tasks assessed LLE Sensation: WFL - Light Touch Trunk Assessment Trunk Assessment: Kyphotic   Balance Balance Balance Assessed: Yes Static Sitting Balance Static Sitting - Balance Support: No upper extremity supported;Feet supported Static Sitting - Level of Assistance: 5: Stand by assistance  End of Session PT  - End of Session Equipment Utilized During Treatment: Gait belt;Oxygen Activity Tolerance: Patient tolerated treatment well Patient left: in chair;with call bell/phone within reach Nurse Communication: Mobility status CPM Right Knee CPM Right Knee: Off Right Knee Flexion (Degrees): 60 Right Knee Extension (Degrees): 0  GP     Shelva Majestic Roxie, Annetta South [phone] 01/29/2013, 3:21 PM

## 2013-01-29 NOTE — Anesthesia Postprocedure Evaluation (Signed)
Anesthesia Post Note  Patient: [name]  Procedure(s) Performed: Procedure(s) (LRB): TOTAL KNEE ARTHROPLASTY WITH HARDWARE REMOVAL (Right)  Anesthesia type: general  Patient location: PACU  Post pain: Pain level controlled  Post assessment: Patient's Cardiovascular Status Stable  Last Vitals:  Filed Vitals:   01/29/13 1243  BP: 133/93  Pulse:   Temp: 36.4 C  Resp:     Post vital signs: Reviewed and stable  Level of consciousness: sedated  Complications: No apparent anesthesia complications

## 2013-01-29 NOTE — Anesthesia Procedure Notes (Signed)
Procedure Name: LMA Insertion Date/Time: 01/29/2013 9:20 AM Performed by: Orvilla Fus A Pre-anesthesia Checklist: Patient identified, Timeout performed, Emergency Drugs available, Suction available and Patient being monitored Patient Re-evaluated:Patient Re-evaluated prior to inductionOxygen Delivery Method: Circle system utilized Preoxygenation: Pre-oxygenation with 100% oxygen Intubation Type: IV induction Ventilation: Mask ventilation without difficulty LMA: LMA inserted LMA Size: 4.0 Number of attempts: 1 Placement Confirmation: breath sounds checked- equal and bilateral and positive ETCO2 Tube secured with: Tape Dental Injury: Teeth and Oropharynx as per pre-operative assessment

## 2013-01-29 NOTE — Evaluation (Addendum)
Occupational Therapy Evaluation Patient Details Name: [name] MRN: [id number] DOB: December 20, 1955 Today's Date: 01/29/2013 Time: 7829-5621 OT Time Calculation (min): 20 min  OT Assessment / Plan / Recommendation Clinical Impression    Pt is a 57 y.o. female s/p R TKA POD#0. Pt presents with below problem list. Would benefit form skilled OT to maximize independence. Pt planning to d/c home with intermittent assistance from family.      OT Assessment  Patient needs continued OT Services    Follow Up Recommendations  Home health OT;Supervision - Intermittent (supervision for mobility/OOB)   Barriers to Discharge      Equipment Recommendations  3 in 1 bedside comode    Recommendations for Other Services    Frequency  Min 2X/week    Precautions / Restrictions Precautions Precautions: Knee;Fall Restrictions Weight Bearing Restrictions: Yes RLE Weight Bearing: Weight bearing as tolerated   Pertinent Vitals/Pain Pain 4/10 in knee. Reported she was premedicated.      ADL  Eating/Feeding: Independent Where Assessed - Eating/Feeding: Chair Grooming: Set up Where Assessed - Grooming: Supported sitting Upper Body Bathing: Set up;Supervision/safety Where Assessed - Upper Body Bathing: Supported sitting Lower Body Bathing: Minimal assistance Where Assessed - Lower Body Bathing: Supported sit to stand Upper Body Dressing: Set up;Supervision/safety Where Assessed - Upper Body Dressing: Supported sitting Lower Body Dressing: Minimal assistance Where Assessed - Lower Body Dressing: Supported sit to Pharmacist, hospital: Simulated;Min guard;Minimal assistance (Minguard-sit to stand and Min A-stand to sit) Statistician Method: Sit to Barista: Other (comment) (from recliner chair) Tub/Shower Transfer Method: Not assessed Equipment Used: Gait belt;Rolling walker Transfers/Ambulation Related to ADLs: Minguard for ambulation. Minguard for sit to stand and Min  A for stand to sit to control decent ADL Comments: Pt able to don Rt sock while sitting in chair. Pt overall Min A for LB ADLs with supported sit to stand transfer.  Briefly introduced how to perform shower transfer by backing into shower with pt and family. Will practice this next session.   OT Diagnosis: Acute pain  OT Problem List: Decreased strength;Decreased range of motion;Impaired balance (sitting and/or standing);Decreased knowledge of precautions;Pain;Decreased knowledge of use of DME or AE;Decreased safety awareness OT Treatment Interventions: Self-care/ADL training;DME and/or AE instruction;Therapeutic activities;Patient/family education;Balance training   OT Goals Acute Rehab OT Goals OT Goal Formulation: With patient Time For Goal Achievement: 02/05/13 Potential to Achieve Goals: Good ADL Goals Pt Will Perform Lower Body Bathing: with modified independence;Sit to stand from chair ADL Goal: Lower Body Bathing - Progress: Goal set today Pt Will Perform Lower Body Dressing: with modified independence;Sit to stand from bed;Sit to stand from chair ADL Goal: Lower Body Dressing - Progress: Goal set today Pt Will Transfer to Toilet: with modified independence;Ambulation;with DME ADL Goal: Toilet Transfer - Progress: Goal set today Pt Will Perform Toileting - Clothing Manipulation: with modified independence;Standing ADL Goal: Toileting - Clothing Manipulation - Progress: Goal set today Pt Will Perform Toileting - Hygiene: with modified independence;Sit to stand from 3-in-1/toilet;Sitting on 3-in-1 or toilet ADL Goal: Toileting - Hygiene - Progress: Goal set today Pt Will Perform Tub/Shower Transfer: Shower transfer;Ambulation;with DME;with modified independence ADL Goal: Tub/Shower Transfer - Progress: Goal set today  Visit Information  Last OT Received On: 01/29/13 Assistance Needed: +1    Subjective Data      Prior Functioning     Home Living Lives With:  Family Available Help at Discharge: Family;Available PRN/intermittently Type of Home: House Home Access: Ramped entrance Home Layout:  Two level Alternate Level Stairs-Number of Steps:  (does not go to second floor at all) Bathroom Shower/Tub: Health visitor: Standard Bathroom Accessibility: Yes How Accessible: Accessible via walker Home Adaptive Equipment: Grab bars around toilet;Grab bars in shower;Hand-held shower hose Prior Function Level of Independence: Independent Able to Take Stairs?: Yes Driving: Yes 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; 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 to help upon D/C Communication Communication: No difficulties         Vision/Perception     Cognition  Cognition Arousal/Alertness: Awake/alert Behavior During Therapy: WFL for tasks assessed/performed Overall Cognitive Status: Within Functional Limits for tasks assessed    Extremity/Trunk Assessment Right Upper Extremity Assessment RUE ROM/Strength/Tone: [facility] for tasks assessed Left Upper Extremity Assessment LUE ROM/Strength/Tone: WFL for tasks assessed     Mobility Bed Mobility Bed Mobility: Not assessed Transfers Transfers: Sit to Stand;Stand to Sit Sit to Stand: 4: Min guard;With upper extremity assist;From chair/3-in-1;With armrests Stand to Sit: 4: Min assist;With upper extremity assist;To chair/3-in-1;With armrests Details for Transfer Assistance: Minguard for sit to stand and required cues and min A to control descent to chair. Cues for hand placement.        Balance   End of Session OT - End of Session Equipment Utilized During Treatment: Gait belt Activity Tolerance: Patient tolerated treatment well Patient left: in chair;with call bell/phone within reach;with family/visitor present   GO     Earlie Raveling OTR/L [phone] 01/29/2013, 4:27 PM

## 2013-01-29 NOTE — Progress Notes (Signed)
UR COMPLETED  

## 2013-01-30 ENCOUNTER — Encounter (HOSPITAL_COMMUNITY): Payer: Self-pay | Admitting: Orthopedic Surgery

## 2013-01-30 LAB — CBC
HCT: 32.9 % — ABNORMAL LOW (ref 36.0–46.0)
Hemoglobin: 10.7 g/dL — ABNORMAL LOW (ref 12.0–15.0)
MCH: 30.1 pg (ref 26.0–34.0)
MCHC: 32.5 g/dL (ref 30.0–36.0)
RBC: 3.56 MIL/uL — ABNORMAL LOW (ref 3.87–5.11)

## 2013-01-30 LAB — BASIC METABOLIC PANEL
BUN: 15 mg/dL (ref 6–23)
CO2: 26 mEq/L (ref 19–32)
GFR calc non Af Amer: 56 mL/min — ABNORMAL LOW (ref >90)
Glucose, Bld: 121 mg/dL — ABNORMAL HIGH (ref 70–99)
Potassium: 4.3 mEq/L (ref 3.5–5.1)

## 2013-01-30 MED ORDER — METHOCARBAMOL 500 MG PO TABS: 500.0000 mg | ORAL_TABLET | Freq: Four times a day (QID) | ORAL | Status: DC | PRN

## 2013-01-30 MED ORDER — ASPIRIN 325 MG PO TBEC: 325.0000 mg | DELAYED_RELEASE_TABLET | Freq: Every day | ORAL | Status: DC

## 2013-01-30 MED ORDER — OXYCODONE-ACETAMINOPHEN 5-325 MG PO TABS: 1.0000 | ORAL_TABLET | ORAL | Status: DC | PRN

## 2013-01-30 NOTE — Progress Notes (Signed)
PT is recommending home with HH and not SNF. CSW will make CM aware. Clinical Social Worker will sign off for now as social work intervention is no longer needed. Please consult us again if new need arises.   Kana Reimann, MSW [phone] 

## 2013-01-30 NOTE — Progress Notes (Signed)
Physical Therapy Treatment Patient Details Name: [name] MRN: [id number] DOB: Jun 30, 1956 Today's Date: 01/30/2013 Time: 7829-5621 PT Time Calculation (min): 17 min  PT Assessment / Plan / Recommendation Comments on Treatment Session  Patient sore since walking this AM but continued to improve. On target for DC today    Follow Up Recommendations  Home health PT;Supervision - Intermittent;Supervision for mobility/OOB     Does the patient have the potential to tolerate intense rehabilitation     Barriers to Discharge        Equipment Recommendations  Rolling walker with 5" wheels;Other (comment)    Recommendations for Other Services    Frequency 7X/week   Plan Discharge plan remains appropriate;Frequency remains appropriate    Precautions / Restrictions Precautions Precautions: Knee Restrictions Weight Bearing Restrictions: Yes RLE Weight Bearing: Weight bearing as tolerated   Pertinent Vitals/Pain 3/10 soreness in knee    Mobility  Bed Mobility Bed Mobility: Not assessed Supine to Sit: 6: Modified independent (Device/Increase time) Sitting - Scoot to Edge of Bed: 6: Modified independent (Device/Increase time) Transfers Sit to Stand: 6: Modified independent (Device/Increase time) Stand to Sit: 6: Modified independent (Device/Increase time) Ambulation/Gait Ambulation/Gait Assistance: 5: Supervision Ambulation Distance (Feet): 120 Feet Assistive device: Rolling walker Ambulation/Gait Assistance Details: CUes for heel strike and posture.  Gait Pattern: Step-through pattern;Decreased stride length Gait velocity: WFL        PT Diagnosis:    PT Problem List:   PT Treatment Interventions:     PT Goals Acute Rehab PT Goals PT Goal: Supine/Side to Sit - Progress: Met PT Goal: Sit to Supine/Side - Progress: Met PT Goal: Sit to Stand - Progress: Met PT Goal: Stand to Sit - Progress: Met PT Goal: Ambulate - Progress: Progressing toward goal PT Goal: Perform Home  Exercise Program - Progress: Progressing toward goal  Visit Information  Last PT Received On: 01/30/13 Assistance Needed: +1    Subjective Data      Cognition  Cognition Arousal/Alertness: Awake/alert Behavior During Therapy: WFL for tasks assessed/performed Overall Cognitive Status: Within Functional Limits for tasks assessed    Balance     End of Session PT - End of Session Equipment Utilized During Treatment: Gait belt Activity Tolerance: Patient tolerated treatment well Patient left: in chair;with call bell/phone within reach CPM Right Knee CPM Right Knee: Off   GP     Fredrich Birks 01/30/2013, 12:10 PM  01/30/2013 Fredrich Birks PTA [phone] pager [phone] office

## 2013-01-30 NOTE — Discharge Summary (Signed)
Patient ID: [name] MRN: [id number] DOB/AGE: [birth year] 57 y.o.  Admit date: 01/29/2013 Discharge date: 01/30/2013  Admission Diagnoses:  Principal Problem:   Osteoarthritis of right knee   Discharge Diagnoses:  Same  Past Medical History  Diagnosis Date  . Hypertension   . High cholesterol   . Fluid retention   . Hypokalemia   . Chronic back pain   . Pneumonia     hospitalized in March  . Arthritis   . Cancer 1992    cervical    Surgeries: Procedure(s): TOTAL KNEE ARTHROPLASTY WITH HARDWARE REMOVAL on 01/29/2013   Consultants:    Discharged Condition: Improved  Hospital Course: [name] is an 57 y.o. female who was admitted 01/29/2013 for operative treatment ofOsteoarthritis of right knee. Patient has severe unremitting pain that affects sleep, daily activities, and work/hobbies. After pre-op clearance the patient was taken to the operating room on 01/29/2013 and underwent  Procedure(s): TOTAL KNEE ARTHROPLASTY WITH HARDWARE REMOVAL.    Patient was given perioperative antibiotics: Anti-infectives   Start     Dose/Rate Route Frequency Ordered Stop   01/29/13 1007  cefUROXime (ZINACEF) injection  Status:  Discontinued       As needed 01/29/13 1007 01/29/13 1141   01/29/13 0600  ceFAZolin (ANCEF) IVPB 2 g/50 mL premix     2 g 100 mL/hr over 30 Minutes Intravenous On call to O.R. 01/28/13 1338 01/29/13 0921       Patient was given sequential compression devices, early ambulation, and chemoprophylaxis to prevent DVT.  Patient benefited maximally from hospital stay and there were no complications. Past all physical therapy goals prior to discharge  Recent vital signs: Patient Vitals for the past 24 hrs:  BP Temp Temp src Pulse Resp SpO2  01/30/13 0659 128/65 mmHg 98.3 F (36.8 C) Oral 93 17 95 %  01/30/13 0149 119/54 mmHg 98.9 F (37.2 C) Oral 86 18 97 %  01/29/13 2105 114/68 mmHg 97.9 F (36.6 C) Oral 84 16 97 %  01/29/13 1243 133/93 mmHg 97.6 F (36.4  C) Oral - - 96 %  01/29/13 1215 - 97.4 F (36.3 C) - - - -  01/29/13 1200 150/94 mmHg - - 87 20 96 %  01/29/13 1145 168/87 mmHg - - 94 16 93 %  01/29/13 1139 165/91 mmHg 97.8 F (36.6 C) - - 16 -     Recent laboratory studies:  Recent Labs  01/30/13 0525  WBC 14.3*  HGB 10.7*  HCT 32.9*  PLT 202  NA 134*  K 4.3  CL 102  CO2 26  BUN 15  CREATININE 1.06  GLUCOSE 121*  CALCIUM 8.9     Discharge Medications:     Medication List    TAKE these medications       aspirin 325 MG EC tablet  Take 1 tablet (325 mg total) by mouth daily with breakfast.     FISH OIL PO  Take 1 tablet by mouth 3 (three) times a week.     fluticasone 50 MCG/ACT nasal spray  Commonly known as:  FLONASE  Place 1 spray into the nose 2 (two) times daily.     furosemide 20 MG tablet  Commonly known as:  LASIX  Take 20 mg by mouth daily.     gemfibrozil 600 MG tablet  Commonly known as:  LOPID  Take 600 mg by mouth 2 (two) times daily before a meal.     lisinopril 20 MG tablet  Commonly known  as:  PRINIVIL,ZESTRIL  Take 20 mg by mouth daily.     lovastatin 20 MG tablet  Commonly known as:  MEVACOR  Take 20 mg by mouth at bedtime.     methocarbamol 500 MG tablet  Commonly known as:  ROBAXIN  Take 1 tablet (500 mg total) by mouth every 6 (six) hours as needed.     oxyCODONE-acetaminophen 5-325 MG per tablet  Commonly known as:  ROXICET  Take 1-2 tablets by mouth every 4 (four) hours as needed for pain.     Potassium Gluconate 550 MG Tabs  Take 1-2 tablets by mouth daily as needed. Takes with fluid pill. May take extra tablet due to cramping in legs.        Diagnostic Studies: No results found.  Disposition: 01-Home or Self Care      Discharge Orders   Future Orders Complete By Expires     CPM  As directed     Comments:      Continuous passive motion machine (CPM):      Use the CPM from 0 to 60 for 5 hours per day.      You may increase by 10 degrees per day.  You may  break it up into 2 or 3 sessions per day.      Use CPM for 2 weeks or until you are told to stop.    Call MD / Call 911  As directed     Comments:      If you experience chest pain or shortness of breath, CALL 911 and be transported to the hospital emergency room.  If you develope a fever above 101 F, pus (white drainage) or increased drainage or redness at the wound, or calf pain, call your surgeon's office.    Change dressing  As directed     Comments:      Change dressing on POD #5.  You may clean the incision with alcohol prior to redressing.    Constipation Prevention  As directed     Comments:      Drink plenty of fluids.  Prune juice may be helpful.  You may use a stool softener, such as Colace (over the counter) 100 mg twice a day.  Use MiraLax (over the counter) for constipation as needed.    Diet - low sodium heart healthy  As directed     Do not put a pillow under the knee. Place it under the heel.  As directed     Driving restrictions  As directed     Comments:      No driving for 2 weeks    Increase activity slowly as tolerated  As directed     Patient may shower  As directed     Comments:      You may shower without a dressing once there is no drainage.  Do not wash over the wound.  If drainage remains, cover wound with plastic wrap and then shower.       Follow-up Information   Follow up with [name], MD In 2 weeks.   Contact information:   [address] [place] [address] [phone]        Signed: [name] 01/30/2013, 7:51 AM

## 2013-01-30 NOTE — Progress Notes (Signed)
Occupational Therapy Treatment Patient Details Name: [name] MRN: [id number] DOB: 1955-12-17 Today's Date: 01/30/2013 Time: 4098-1191 OT Time Calculation (min): 12 min  OT Assessment / Plan / Recommendation Comments on Treatment Session   Pt progressing towards goals. Pt performed UB/LB dressing and simulated toilet transfer.      Follow Up Recommendations  No OT follow up;Supervision - Intermittent ((supervision for mobility/OOB))    Barriers to Discharge       Equipment Recommendations  3 in 1 bedside comode    Recommendations for Other Services    Frequency Min 2X/week   Plan Discharge plan needs to be updated    Precautions / Restrictions Precautions Precautions: Knee Restrictions Weight Bearing Restrictions: Yes RLE Weight Bearing: Weight bearing as tolerated   Pertinent Vitals/Pain Pain 8/10. Nurse notified. Repositioned.     ADL  Upper Body Dressing: Performed;Set up Where Assessed - Upper Body Dressing: Supported sitting Lower Body Dressing: Performed;Min guard Where Assessed - Lower Body Dressing: Supported sit to Pharmacist, hospital: Supervision/safety Statistician Method: Sit to Barista: Raised toilet seat with arms (or 3-in-1 over toilet) Transfers/Ambulation Related to ADLs: Minguard for ambulation; supervision for transfers. ADL Comments: Pt performed LB dressing at Minguard assist. Able to reach down to don/doff right sock.     OT Diagnosis:    OT Problem List:   OT Treatment Interventions:     OT Goals Acute Rehab OT Goals OT Goal Formulation: With patient Time For Goal Achievement: 02/05/13 Potential to Achieve Goals: Good ADL Goals Pt Will Perform Lower Body Bathing: with modified independence;Sit to stand from chair Pt Will Perform Lower Body Dressing: with modified independence;Sit to stand from bed;Sit to stand from chair ADL Goal: Lower Body Dressing - Progress: Progressing toward goals Pt Will Transfer  to Toilet: with modified independence;Ambulation;with DME ADL Goal: Toilet Transfer - Progress: Progressing toward goals Pt Will Perform Toileting - Clothing Manipulation: with modified independence;Standing Pt Will Perform Toileting - Hygiene: with modified independence;Sit to stand from 3-in-1/toilet;Sitting on 3-in-1 or toilet Pt Will Perform Tub/Shower Transfer: Shower transfer;Ambulation;with DME;with modified independence  Visit Information  Last OT Received On: 01/30/13 Assistance Needed: +1    Subjective Data      Prior Functioning       Cognition  Cognition Arousal/Alertness: Awake/alert Behavior During Therapy: WFL for tasks assessed/performed Overall Cognitive Status: Within Functional Limits for tasks assessed    Mobility  Bed Mobility Bed Mobility: Not assessed Transfers Transfers: Sit to Stand;Stand to Sit Sit to Stand: 5: Supervision;With upper extremity assist;From chair/3-in-1 Stand to Sit: 5: Supervision;With upper extremity assist;To chair/3-in-1 Details for Transfer Assistance: supervision for safety.       Balance     End of Session OT - End of Session Activity Tolerance: Patient tolerated treatment well Patient left: with call bell/phone within reach;in chair Nurse Communication: Patient requests pain meds CPM Right Knee CPM Right Knee: Off  GO     Earlie Raveling OTR/L [phone] 01/30/2013, 1:52 PM

## 2013-01-30 NOTE — Progress Notes (Signed)
Occupational Therapy Treatment Patient Details Name: [name] MRN: [id number] DOB: [birth year] Today's Date: 01/30/2013 Time: 6213-0865 OT Time Calculation (min): 14 min  OT Assessment / Plan / Recommendation Comments on Treatment Session  Pt practiced simulated shower transfer. Pt feels comfortable with information OT covered.     Follow Up Recommendations  No OT follow up;Supervision - Intermittent (supervision for mobility/OOB)    Barriers to Discharge       Equipment Recommendations  3 in 1 bedside comode    Recommendations for Other Services    Frequency Min 2X/week   Plan Discharge plan remains appropriate    Precautions / Restrictions Precautions Precautions: Knee Restrictions Weight Bearing Restrictions: Yes RLE Weight Bearing: Weight bearing as tolerated   Pertinent Vitals/Pain Pt reports pain in knee, but did not rate. Premedicated.     ADL   Toilet Transfer: Radiographer, therapeutic Method: Sit to Barista: Raised toilet seat with arms (or 3-in-1 over toilet); from bed Tub/Shower Transfer: Simulated;Minimal assistance Tub/Shower Transfer Method: Science writer: Other (comment);Walk in shower (3 in 1) Equipment Used: Rolling walker Transfers/Ambulation Related to ADLs: Minguard for ambulation. Supervision for sit <>stand transfers. Min A for shower transfer. ADL Comments: Pt practiced shower transfer by side stepping with walker due to bathroom setup. Pt at Min A level to maneuver walker and for cues/sequencing. OT explained someone should be with her when she gets in/out of shower. Pt felt comfortable with shower transfer.    OT Diagnosis:    OT Problem List:   OT Treatment Interventions:     OT Goals Acute Rehab OT Goals OT Goal Formulation: With patient Time For Goal Achievement: 02/05/13 Potential to Achieve Goals: Good ADL Goals Pt Will Perform Lower Body Bathing: with modified  independence;Sit to stand from chair Pt Will Perform Lower Body Dressing: with modified independence;Sit to stand from bed;Sit to stand from chair Pt Will Transfer to Toilet: with modified independence;Ambulation;with DME ADL Goal: Toilet Transfer - Progress: Progressing toward goals Pt Will Perform Toileting - Clothing Manipulation: with modified independence;Standing Pt Will Perform Toileting - Hygiene: with modified independence;Sit to stand from 3-in-1/toilet;Sitting on 3-in-1 or toilet Pt Will Perform Tub/Shower Transfer: Shower transfer;Ambulation;with DME;with modified independence ADL Goal: Tub/Shower Transfer - Progress: Progressing toward goals  Visit Information  Last OT Received On: 01/30/13 Assistance Needed: +1    Subjective Data      Prior Functioning       Cognition  Cognition Arousal/Alertness: Awake/alert Behavior During Therapy: WFL for tasks assessed/performed Overall Cognitive Status: Within Functional Limits for tasks assessed    Mobility  Bed Mobility Bed Mobility: Sit to Supine; Scooting to HOB Sit to Supine: 5: Supervision;HOB flat Scooting to [facility]: Supervision Details for Bed Mobility Assistance: Increased time. Pt able to lift RLE with hands to help assist it onto bed. Transfers Transfers: Sit to Stand;Stand to Sit Sit to Stand: 5: Supervision;With upper extremity assist;From bed;From chair/3-in-1 Stand to Sit: 5: Supervision;With upper extremity assist;To chair/3-in-1;To bed Details for Transfer Assistance: supervision for safety. cues for hand placement       Balance     End of Session OT - End of Session Activity Tolerance: Patient tolerated treatment well Patient left: in bed;with call bell/phone within reach CPM Right Knee CPM Right Knee: Off  GO     Earlie Raveling OTR/L [phone] 01/30/2013, 2:02 PM

## 2013-01-30 NOTE — Progress Notes (Signed)
Patient ID: [name], female   DOB: [birth year], 57 y.o.   MRN: [id number] PATIENT ID: [name]  MRN: [id number]  DOB/AGE:  1956-08-20 / 57 y.o.  1 Day Post-Op Procedure(s) (LRB): TOTAL KNEE ARTHROPLASTY WITH HARDWARE REMOVAL (Right)    PROGRESS NOTE Subjective: Patient is alert, oriented, no Nausea, no Vomiting, yes passing gas, no Bowel Movement. Taking PO well. Denies SOB, Chest or Calf Pain. Using Incentive Spirometer, PAS in place. Ambulate the patient walked in room yesterday and can almost do a leg lift on her own today, CPM 0-60 Patient reports pain as 2 on 0-10 scale  .    Objective: Vital signs in last 24 hours: Filed Vitals:   01/29/13 1243 01/29/13 2105 01/30/13 0149 01/30/13 0659  BP: 133/93 114/68 119/54 128/65  Pulse:  84 86 93  Temp: 97.6 F (36.4 C) 97.9 F (36.6 C) 98.9 F (37.2 C) 98.3 F (36.8 C)  TempSrc: Oral Oral Oral Oral  Resp:  16 18 17   SpO2: 96% 97% 97% 95%      Intake/Output from previous day: I/O last 3 completed shifts: In: 1995 [P.O.:120; I.V.:1875] Out: 695 [Urine:150; Drains:525; Blood:20]   Intake/Output this shift:     LABORATORY DATA:  Recent Labs  01/30/13 0525  WBC 14.3*  HGB 10.7*  HCT 32.9*  PLT 202  NA 134*  K 4.3  CL 102  CO2 26  BUN 15  CREATININE 1.06  GLUCOSE 121*  CALCIUM 8.9    Examination: Neurologically intact ABD soft Neurovascular intact Sensation intact distally Intact pulses distally Dorsiflexion/Plantar flexion intact Incision: moderate drainage No cellulitis present Compartment soft} dressing was reinforced by nursing staff yesterday, had dried blood on this morning Blood and plasma separated in drain indicating minimal recent drainage, drain pulled without difficulty.  Assessment:   1 Day Post-Op Procedure(s) (LRB): TOTAL KNEE ARTHROPLASTY WITH HARDWARE REMOVAL (Right) ADDITIONAL DIAGNOSIS:  Hypertension  Plan: PT/OT WBAT, CPM 5/hrs day until ROM 0-90 degrees, then D/C CPM DVT  Prophylaxis:  SCDx72hrs, ASA 325 mg BID x 2 weeks DISCHARGE PLAN: Home patient did well with physical therapy yesterday anticipate discharge today DISCHARGE NEEDS: HHPT, HHRN, CPM, Walker and 3-in-1 comode seat     Diera Wirkkala J 01/30/2013, 7:38 AM

## 2013-01-30 NOTE — Progress Notes (Addendum)
   CARE MANAGEMENT NOTE 01/30/2013  Patient:  MEGGEN, SPAZIANI   Account Number:  [id number]  Date Initiated:  01/30/2013  Documentation initiated by:  [facility]  Subjective/Objective Assessment:   TOTAL KNEE ARTHROPLASTY WITH HARDWARE REMOVAL     Action/Plan:   HH   Anticipated DC Date:  01/30/2013   Anticipated DC Plan:  HOME W HOME HEALTH SERVICES      DC Planning Services  CM consult      [facility] Choice  HOME HEALTH   Choice offered to / List presented to:  C-1 Patient   DME arranged  3-N-1  WALKER - ROLLING  CPM      DME agency  [employer]     HH arranged  HH-2 PT      HH agency  [facility]   Status of service:  Completed, signed off Medicare Important Message given?   (If response is "NO", the following Medicare IM given date fields will be blank) Date Medicare IM given:   Date Additional Medicare IM given:    Discharge Disposition:  HOME W HOME HEALTH SERVICES  Per UR Regulation:    If discussed at Long Length of Stay Meetings, dates discussed:    Comments:  01/30/2013 1200  NCM spoke to pt and she is agreeable to [facility]. She is concerned about copay cost. Spoke to [facility] rep and they will speak to pt about HH. Pt had DME in room delivered by [employer]. They will deliver CPM once she gets home. Isidoro Donning RN CCM Case Mgmt phone [phone]

## 2013-01-30 NOTE — Progress Notes (Signed)
Patient discharged in stable condition via wheelchair to home. Discharge instructions and prescriptions were given and explained. 

## 2013-01-30 NOTE — Progress Notes (Signed)
Physical Therapy Treatment Patient Details Name: JAYLIAH BENETT MRN: [id number] DOB: [birth year] Today's Date: 01/30/2013 Time: 0805-0829 PT Time Calculation (min): 24 min  PT Assessment / Plan / Recommendation Comments on Treatment Session  Patient progressing well this AM. Anticipate DC today    Follow Up Recommendations  Home health PT;Supervision - Intermittent;Supervision for mobility/OOB     Does the patient have the potential to tolerate intense rehabilitation     Barriers to Discharge        Equipment Recommendations  Rolling walker with 5" wheels;Other (comment)    Recommendations for Other Services    Frequency 7X/week   Plan Discharge plan remains appropriate;Frequency remains appropriate    Precautions / Restrictions Precautions Precautions: Knee Restrictions Weight Bearing Restrictions: Yes RLE Weight Bearing: Weight bearing as tolerated   Pertinent Vitals/Pain no apparent distress     Mobility  Bed Mobility Bed Mobility: Not assessed Transfers Sit to Stand: 5: Supervision Stand to Sit: 5: Supervision Ambulation/Gait Ambulation/Gait Assistance: 4: Min guard Ambulation Distance (Feet): 80 Feet Assistive device: Rolling walker Ambulation/Gait Assistance Details: CUes for heel strike and posture.  Gait Pattern: Step-through pattern;Decreased stride length;Trunk flexed    Exercises Total Joint Exercises Quad Sets: AROM;Right;10 reps Heel Slides: AROM;Right;10 reps Hip ABduction/ADduction: AROM;Right;10 reps Straight Leg Raises: AROM;Right;10 reps Long Arc Quad: AROM;Right;10 reps   PT Diagnosis:    PT Problem List:   PT Treatment Interventions:     PT Goals Acute Rehab PT Goals PT Goal: Sit to Stand - Progress: Progressing toward goal PT Goal: Stand to Sit - Progress: Progressing toward goal PT Goal: Ambulate - Progress: Progressing toward goal PT Goal: Perform Home Exercise Program - Progress: Progressing toward goal  Visit Information  Last  PT Received On: 01/30/13 Assistance Needed: +1    Subjective Data      Cognition  Cognition Arousal/Alertness: Awake/alert Behavior During Therapy: WFL for tasks assessed/performed Overall Cognitive Status: Within Functional Limits for tasks assessed    Balance     End of Session PT - End of Session Equipment Utilized During Treatment: Gait belt Activity Tolerance: Patient tolerated treatment well Patient left: in chair;with call bell/phone within reach CPM Right Knee CPM Right Knee: Off   GP     Fredrich Birks 01/30/2013, 11:55 AM  01/30/2013 Fredrich Birks PTA [phone] pager [phone] office

## 2013-01-31 LAB — TYPE AND SCREEN
Antibody Screen: POSITIVE
DAT, IgG: NEGATIVE

## 2013-03-20 ENCOUNTER — Other Ambulatory Visit (HOSPITAL_COMMUNITY): Payer: Self-pay | Admitting: Family Medicine

## 2013-03-20 DIAGNOSIS — K5730 Diverticulosis of large intestine without perforation or abscess without bleeding: Secondary | ICD-10-CM

## 2013-03-22 ENCOUNTER — Ambulatory Visit (HOSPITAL_COMMUNITY)
Admission: RE | Admit: 2013-03-22 | Discharge: 2013-03-22 | Disposition: A | Discharge status: Home / Self Care | Payer: BC Managed Care – PPO | Source: Ambulatory Visit | Attending: Family Medicine | Admitting: Family Medicine

## 2013-03-22 ENCOUNTER — Telehealth: Payer: Self-pay

## 2013-03-22 DIAGNOSIS — K5730 Diverticulosis of large intestine without perforation or abscess without bleeding: Secondary | ICD-10-CM

## 2013-03-22 DIAGNOSIS — R109 Unspecified abdominal pain: Secondary | ICD-10-CM | POA: Insufficient documentation

## 2013-03-22 MED ORDER — IOHEXOL 300 MG/ML  SOLN
100.0000 mL | Freq: Once | INTRAMUSCULAR | Status: AC | PRN
  Administered 2013-03-22: 100 mL via INTRAVENOUS

## 2013-03-22 NOTE — Telephone Encounter (Signed)
Pt was referred by Dr. Regino Schultze for screening colonoscopy. LMOM for a return call.

## 2013-03-22 NOTE — Telephone Encounter (Signed)
PT returned call. Said she is scheduled to have a CT tonight. She has been having some Left side pain. She is planning to go out of town for a few days if the CT is ok.  She will call when she gets back into town.

## 2013-04-18 NOTE — Telephone Encounter (Signed)
I called pt and she is having a lot going on at this time, wants to schedule out a little.  Said the CT did show some diverticulosis. She also has some problems with her BM's. Ov with LL on 05/21/2013 at 9:30 AM. ( Pt wanted me to mail her a letter with date and time since she is driving and I did).

## 2013-05-21 ENCOUNTER — Encounter: Payer: Self-pay | Admitting: Gastroenterology

## 2013-05-21 ENCOUNTER — Other Ambulatory Visit: Payer: Self-pay | Admitting: Internal Medicine

## 2013-05-21 ENCOUNTER — Ambulatory Visit (INDEPENDENT_AMBULATORY_CARE_PROVIDER_SITE_OTHER): Payer: BC Managed Care – PPO | Admitting: Gastroenterology

## 2013-05-21 VITALS — BP 160/110 | HR 82 | Temp 97.9°F | Ht 66.0 in | Wt 224.0 lb

## 2013-05-21 DIAGNOSIS — R6881 Early satiety: Secondary | ICD-10-CM | POA: Insufficient documentation

## 2013-05-21 DIAGNOSIS — K5909 Other constipation: Secondary | ICD-10-CM

## 2013-05-21 DIAGNOSIS — K5900 Constipation, unspecified: Secondary | ICD-10-CM

## 2013-05-21 DIAGNOSIS — K5730 Diverticulosis of large intestine without perforation or abscess without bleeding: Secondary | ICD-10-CM

## 2013-05-21 DIAGNOSIS — R634 Abnormal weight loss: Secondary | ICD-10-CM

## 2013-05-21 DIAGNOSIS — R1032 Left lower quadrant pain: Secondary | ICD-10-CM

## 2013-05-21 MED ORDER — PEG 3350-KCL-NA BICARB-NACL 420 G PO SOLR: 4000.0000 mL | ORAL | Status: DC

## 2013-05-21 MED ORDER — LUBIPROSTONE 24 MCG PO CAPS: 24.0000 ug | ORAL_CAPSULE | Freq: Two times a day (BID) | ORAL | Status: DC

## 2013-05-21 NOTE — Assessment & Plan Note (Signed)
40 pound weight loss, early satiety in the setting of chronic NSAID use. Describes history of peptic ulcer disease in the past. Need to exclude peptic ulcer disease, gastritis.  1. EGD in the near future.  I have discussed the risks, alternatives, benefits with regards to but not limited to the risk of reaction to medication, bleeding, infection, perforation and the patient is agreeable to proceed. Written consent to be obtained.

## 2013-05-21 NOTE — Assessment & Plan Note (Signed)
57 year old lady who presents with chronic constipation, associated left sided abdominal pain with history of diverticulosis based on CT scan. No prior colonoscopy. Cannot rule out intermittent diverticulitis but none seen on most recent CT scan. 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  1. Colonoscopy in the near future.  I have discussed the risks, alternatives, benefits with regards to but not limited to the risk of reaction to medication, bleeding, infection, perforation and the patient is agreeable to proceed. Written consent to be obtained. 2. Amitiza 24 mcg twice a day with food. Prescription and samples provided. 3. High-fiber diet.

## 2013-05-21 NOTE — Patient Instructions (Addendum)
1. Start Amitiza 1 capsule twice a day with food for constipation. Prescription sent to [facility]. Samples provided. 2. We have scheduled you for a colonoscopy and upper endoscopy with Dr. Jena Gauss. Please see separate instructions.

## 2013-05-21 NOTE — Progress Notes (Signed)
Primary Care Physician:  Gracelyn Nurse, PA-C  Primary Gastroenterologist:  Roetta Sessions, MD   Chief Complaint  Patient presents with  . Colonoscopy    HPI:  [name] is a 57 y.o. female here to schedule colonoscopy. Back in July 2014 she had abdominal pain and was treated empirically for diverticulitis. 2 days after starting antibiotics she underwent a CT scan which showed diverticulosis but otherwise unremarkable. Had pain for about one month prior to seeking medical attention.   Chronic left lower abdominal pain. Worse when she is constipated. Takes MOM liquid three times per week just to go to the bathroom. She cannot remember when she was able to have a bowel movement without laxatives. Suprapubic and LLQ pain if has to strain and passes large stool. If keeps stools loose, then less pain. Never tried Miralax. Lot of bowel issues in mother, daughter as well. No brbpr, melena. Lot of gas. No heartburn, dysphagia. Since June has lost 40 pounds, unintentional. Early satiety. H/O stomach problems in the remote past when she was taking a lot of aspirin products. Currently takes a daily aspirin and Mobic. Vomits if drinks red wine or eats red stuff.  Current Outpatient Prescriptions  Medication Sig Dispense Refill  . aspirin EC 325 MG EC tablet Take 1 tablet (325 mg total) by mouth daily with breakfast.  30 tablet  0  . fluticasone (FLONASE) 50 MCG/ACT nasal spray Place 1 spray into the nose 2 (two) times daily.      . furosemide (LASIX) 20 MG tablet Take 20 mg by mouth daily.      Marland Kitchen gemfibrozil (LOPID) 600 MG tablet Take 600 mg by mouth 2 (two) times daily before a meal.      . HYDROcodone-acetaminophen (NORCO) 7.5-325 MG per tablet Take 1 tablet by mouth every 6 (six) hours as needed for pain.      Marland Kitchen lisinopril (PRINIVIL,ZESTRIL) 20 MG tablet Take 20 mg by mouth daily.      Marland Kitchen lovastatin (MEVACOR) 20 MG tablet Take 20 mg by mouth at bedtime.      . meloxicam (MOBIC) 7.5 MG tablet  Take 7.5 mg by mouth daily.      . Omega-3 Fatty Acids (FISH OIL PO) Take 1 tablet by mouth 3 (three) times a week.      . Potassium Gluconate 550 MG TABS Take 1-2 tablets by mouth daily as needed. Takes with fluid pill. May take extra tablet due to cramping in legs.       No current facility-administered medications for this visit.    Allergies as of 05/21/2013 - Review Complete 05/21/2013  Allergen Reaction Noted  . Clindamycin/lincomycin Swelling 01/29/2013  . Codeine Itching 01/23/2013  . Levaquin [levofloxacin] Swelling 01/29/2013    Past Medical History  Diagnosis Date  . Hypertension   . High cholesterol   . Fluid retention   . Hypokalemia   . Chronic back pain   . Pneumonia     hospitalized in March  . Arthritis   . Cancer 1992    cervical    Past Surgical History  Procedure Laterality Date  . Abdominal hysterectomy    . Cholecystectomy    . Knee surgery      right and left  . Total knee arthroplasty Right 01/29/2013    Procedure: TOTAL KNEE ARTHROPLASTY WITH HARDWARE REMOVAL;  Surgeon: [name], MD;  Location: [facility] OR;  Service: Orthopedics;  Laterality: Right;  DEPUY/SIGMA RP, SYNTHES SCREWDRIVERS    Family  History  Problem Relation Age of Onset  . Ulcers Father   . Colon cancer Neg Hx     History   Social History  . Marital Status: Widowed    Spouse Name: N/A    Number of Children: N/A  . Years of Education: N/A   Occupational History  . Not on file.   Social History Main Topics  . Smoking status: Current Every Day Smoker -- 0.50 packs/day for 43 years    Types: Cigarettes  . Smokeless tobacco: Not on file  . Alcohol Use: Yes     Comment: occasional  . Drug Use: No  . Sexual Activity: Not on file   Other Topics Concern  . Not on file   Social History Narrative  . No narrative on file      ROS:  General: Negative for anorexia, fever, chills, fatigue, weakness. See history of present illness Eyes: Negative for vision changes.  ENT:  Negative for hoarseness, difficulty swallowing , nasal congestion. CV: Negative for chest pain, angina, palpitations, dyspnea on exertion, peripheral edema.  Respiratory: Negative for dyspnea at rest, dyspnea on exertion, cough, sputum, wheezing.  GI: See history of present illness. GU:  Negative for dysuria, hematuria, urinary incontinence, urinary frequency, nocturnal urination.  MS: Negative for joint pain, low back pain.  Derm: Negative for rash or itching.  Neuro: Negative for weakness, abnormal sensation, seizure, frequent headaches, memory loss, confusion.  Psych: Negative for anxiety, depression, suicidal ideation, hallucinations.  Endo: See history of present illness Heme: Negative for bruising or bleeding. Allergy: Negative for rash or hives.    Physical Examination:  BP 160/110  Pulse 82  Temp(Src) 97.9 F (36.6 C) (Oral)  Ht 5\' 6"  (1.676 m)  Wt 224 lb (101.606 kg)  BMI 36.17 kg/m2   General: Well-nourished, well-developed in no acute distress.  Head: Normocephalic, atraumatic.   Eyes: Conjunctiva pink, no icterus. Mouth: Oropharyngeal mucosa moist and pink , no lesions erythema or exudate. Neck: Supple without thyromegaly, masses, or lymphadenopathy.  Lungs: Clear to auscultation bilaterally.  Heart: Regular rate and rhythm, no murmurs rubs or gallops.  Abdomen: Bowel sounds are normal, mild left lower quadrant tenderness, nondistended, no hepatosplenomegaly or masses, no abdominal bruits or    hernia , no rebound or guarding.   Rectal: Not performed Extremities: No lower extremity edema. No clubbing or deformities.  Neuro: Alert and oriented x 4 , grossly normal neurologically.  Skin: Warm and dry, no rash or jaundice.   Psych: Alert and cooperative, normal mood and affect.    Imaging Studies: CT abdomen pelvis with contrast 03/22/2013  IMPRESSION:  Diverticulosis of colon without diverticulitis. No acute  abnormality is identified in the abdomen and  pelvis.

## 2013-05-21 NOTE — Progress Notes (Signed)
CC'd to PCP 

## 2013-05-24 ENCOUNTER — Encounter (HOSPITAL_COMMUNITY): Payer: Self-pay | Admitting: Pharmacy Technician

## 2013-05-24 ENCOUNTER — Other Ambulatory Visit: Payer: Self-pay | Admitting: Orthopedic Surgery

## 2013-05-29 ENCOUNTER — Encounter (HOSPITAL_COMMUNITY): Payer: Self-pay | Admitting: Pharmacy Technician

## 2013-06-01 ENCOUNTER — Encounter (HOSPITAL_COMMUNITY): Payer: Self-pay

## 2013-06-01 ENCOUNTER — Encounter (HOSPITAL_COMMUNITY)
Admission: RE | Admit: 2013-06-01 | Discharge: 2013-06-01 | Disposition: A | Discharge status: Home / Self Care | Payer: BC Managed Care – PPO | Source: Ambulatory Visit | Attending: Orthopedic Surgery | Admitting: Orthopedic Surgery

## 2013-06-01 DIAGNOSIS — Z01812 Encounter for preprocedural laboratory examination: Secondary | ICD-10-CM | POA: Insufficient documentation

## 2013-06-01 LAB — CBC WITH DIFFERENTIAL/PLATELET
Basophils Absolute: 0.1 10*3/uL (ref 0.0–0.1)
Basophils Relative: 1 % (ref 0–1)
Eosinophils Relative: 3 % (ref 0–5)
Lymphocytes Relative: 33 % (ref 12–46)
MCHC: 32.9 g/dL (ref 30.0–36.0)
MCV: 89.2 fL (ref 78.0–100.0)
Monocytes Absolute: 0.6 10*3/uL (ref 0.1–1.0)
Neutro Abs: 5.9 10*3/uL (ref 1.7–7.7)
Neutrophils Relative %: 58 % (ref 43–77)
Platelets: 261 10*3/uL (ref 150–400)
RBC: 4.64 MIL/uL (ref 3.87–5.11)
RDW: 14.3 % (ref 11.5–15.5)
WBC: 10.3 10*3/uL (ref 4.0–10.5)

## 2013-06-01 LAB — PROTIME-INR
INR: 0.91 (ref 0.00–1.49)
Prothrombin Time: 12.1 seconds (ref 11.6–15.2)

## 2013-06-01 LAB — BASIC METABOLIC PANEL
BUN: 13 mg/dL (ref 6–23)
Calcium: 10.5 mg/dL (ref 8.4–10.5)
Creatinine, Ser: 0.94 mg/dL (ref 0.50–1.10)
GFR calc Af Amer: 75 mL/min — ABNORMAL LOW (ref >90)
GFR calc non Af Amer: 65 mL/min — ABNORMAL LOW (ref >90)
Potassium: 4.2 mEq/L (ref 3.5–5.1)
Sodium: 141 mEq/L (ref 135–145)

## 2013-06-01 LAB — URINALYSIS, ROUTINE W REFLEX MICROSCOPIC
Bilirubin Urine: NEGATIVE
Hgb urine dipstick: NEGATIVE
Ketones, ur: NEGATIVE mg/dL
Leukocytes, UA: NEGATIVE
Nitrite: NEGATIVE
Protein, ur: NEGATIVE mg/dL
pH: 6 (ref 5.0–8.0)

## 2013-06-01 LAB — APTT: aPTT: 31 seconds (ref 24–37)

## 2013-06-01 LAB — PREPARE RBC (CROSSMATCH)

## 2013-06-01 NOTE — Progress Notes (Signed)
SPOKE WITH  [name] RE: CXR FROM April , PATIENT DENIES ANY SOB OR COUGH.  XRAY WAS USED FOR KNEE SURGERY IN June.  PER [name] PATIENT DID NOT NEED CXR REPEATED.

## 2013-06-01 NOTE — Pre-Procedure Instructions (Signed)
[name]  06/01/2013   Your procedure is scheduled on:  Monday, October 20th.  Report to [facility], Main Entrance/ Entrance "A"at 5:30  Call this number if you have problems the morning of surgery: [phone]   Remember:   Do not eat food or drink liquids after midnight.   Take these medicines the morning of surgery with A SIP OF WATER: if needed-HYDROcodone-acetaminophen (NORCO).  Stop taking: meloxicam [facility])  On Monday, October 13th.  Do not take any Aspirin, Naproxen (ALEVE), Ibuprofen (Advil) products.    Do not wear jewelry, make-up or nail polish.  Do not wear lotions, powders, or perfumes. You may wear deodorant.  Do not shave 48 hours prior to surgery.   Do not bring valuables to the hospital.  [facility] is not responsible for any belongings or valuables.               Contacts, dentures or bridgework may not be worn into surgery.  Leave suitcase in the car. After surgery it may be brought to your room.  For patients admitted to the hospital, discharge time is determined by your treatment team.                 Special Instructions: Shower using CHG 2 nights before surgery and the night before surgery.  If you shower the day of surgery use CHG.  Use special wash - you have one bottle of CHG for all showers.  You should use approximately 1/3 of the bottle for each shower.   Please read over the following fact sheets that you were given: Pain Booklet, Coughing and Deep Breathing, Blood Transfusion Information and Surgical Site Infection Prevention

## 2013-06-01 NOTE — Progress Notes (Signed)
Dr Wadie Lessen office made aware that patient's nasal swab was positive for staph and patient made aware, office will call with follow-up.

## 2013-06-08 ENCOUNTER — Encounter (HOSPITAL_COMMUNITY): Payer: Self-pay | Admitting: *Deleted

## 2013-06-08 ENCOUNTER — Ambulatory Visit (HOSPITAL_COMMUNITY)
Admission: RE | Admit: 2013-06-08 | Discharge: 2013-06-08 | Disposition: A | Discharge status: Home / Self Care | Payer: BC Managed Care – PPO | Source: Ambulatory Visit | Attending: Internal Medicine | Admitting: Internal Medicine

## 2013-06-08 ENCOUNTER — Encounter (HOSPITAL_COMMUNITY)
Admission: RE | Disposition: A | Discharge status: Home / Self Care | Payer: Self-pay | Source: Ambulatory Visit | Attending: Internal Medicine

## 2013-06-08 DIAGNOSIS — R1013 Epigastric pain: Secondary | ICD-10-CM | POA: Insufficient documentation

## 2013-06-08 DIAGNOSIS — D126 Benign neoplasm of colon, unspecified: Secondary | ICD-10-CM

## 2013-06-08 DIAGNOSIS — K648 Other hemorrhoids: Secondary | ICD-10-CM

## 2013-06-08 DIAGNOSIS — K449 Diaphragmatic hernia without obstruction or gangrene: Secondary | ICD-10-CM

## 2013-06-08 DIAGNOSIS — I10 Essential (primary) hypertension: Secondary | ICD-10-CM | POA: Insufficient documentation

## 2013-06-08 DIAGNOSIS — R6881 Early satiety: Secondary | ICD-10-CM

## 2013-06-08 DIAGNOSIS — R634 Abnormal weight loss: Secondary | ICD-10-CM

## 2013-06-08 DIAGNOSIS — K5730 Diverticulosis of large intestine without perforation or abscess without bleeding: Secondary | ICD-10-CM

## 2013-06-08 DIAGNOSIS — R1032 Left lower quadrant pain: Secondary | ICD-10-CM

## 2013-06-08 DIAGNOSIS — K5909 Other constipation: Secondary | ICD-10-CM

## 2013-06-08 DIAGNOSIS — K2970 Gastritis, unspecified, without bleeding: Secondary | ICD-10-CM | POA: Insufficient documentation

## 2013-06-08 DIAGNOSIS — K5900 Constipation, unspecified: Secondary | ICD-10-CM

## 2013-06-08 DIAGNOSIS — K259 Gastric ulcer, unspecified as acute or chronic, without hemorrhage or perforation: Secondary | ICD-10-CM

## 2013-06-08 HISTORY — DX: Diverticulosis of intestine, part unspecified, without perforation or abscess without bleeding: K57.90

## 2013-06-08 HISTORY — PX: COLONOSCOPY WITH ESOPHAGOGASTRODUODENOSCOPY (EGD): SHX5779

## 2013-06-08 SURGERY — COLONOSCOPY WITH ESOPHAGOGASTRODUODENOSCOPY (EGD)
Anesthesia: Moderate Sedation

## 2013-06-08 MED ORDER — STERILE WATER FOR IRRIGATION IR SOLN
Status: DC | PRN
  Administered 2013-06-08: 08:00:00

## 2013-06-08 MED ORDER — ONDANSETRON HCL 4 MG/2ML IJ SOLN
INTRAMUSCULAR | Status: DC | PRN
  Administered 2013-06-08: 4 mg via INTRAVENOUS

## 2013-06-08 MED ORDER — MEPERIDINE HCL 100 MG/ML IJ SOLN
INTRAMUSCULAR | Status: DC | PRN
  Administered 2013-06-08: 50 mg via INTRAVENOUS
  Administered 2013-06-08 (×2): 25 mg via INTRAVENOUS
  Administered 2013-06-08 (×2): 50 mg via INTRAVENOUS

## 2013-06-08 MED ORDER — MIDAZOLAM HCL 5 MG/5ML IJ SOLN
INTRAMUSCULAR | Status: AC
  Filled 2013-06-08: qty 10

## 2013-06-08 MED ORDER — BUTAMBEN-TETRACAINE-BENZOCAINE 2-2-14 % EX AERO
INHALATION_SPRAY | CUTANEOUS | Status: DC | PRN
  Administered 2013-06-08: 2 via TOPICAL

## 2013-06-08 MED ORDER — ONDANSETRON HCL 4 MG/2ML IJ SOLN
INTRAMUSCULAR | Status: AC
  Filled 2013-06-08: qty 2

## 2013-06-08 MED ORDER — MIDAZOLAM HCL 5 MG/5ML IJ SOLN
INTRAMUSCULAR | Status: DC | PRN
  Administered 2013-06-08 (×2): 1 mg via INTRAVENOUS
  Administered 2013-06-08: 2 mg via INTRAVENOUS
  Administered 2013-06-08: 1 mg via INTRAVENOUS
  Administered 2013-06-08 (×2): 2 mg via INTRAVENOUS
  Administered 2013-06-08: 1 mg via INTRAVENOUS

## 2013-06-08 MED ORDER — MEPERIDINE HCL 100 MG/ML IJ SOLN
INTRAMUSCULAR | Status: AC
  Filled 2013-06-08: qty 2

## 2013-06-08 MED ORDER — SODIUM CHLORIDE 0.9 % IV SOLN
INTRAVENOUS | Status: DC
  Administered 2013-06-08: 1000 mL via INTRAVENOUS

## 2013-06-08 NOTE — Op Note (Signed)
[facility] [address] [place], [address]   ENDOSCOPY PROCEDURE REPORT  PATIENT: [name], [name]  MR#: [id number] BIRTHDATE: 26-May-1956 , 57  yrs. old GENDER: Female ENDOSCOPIST: R.  Roetta Sessions, MD FACP [facility] REFERRED BY:  Justin Mend, P.A. PROCEDURE DATE:  06/08/2013 PROCEDURE:     EGD with gastric biopsy  INDICATIONS:      early satiety; epigastric pain  INFORMED CONSENT:   The risks, benefits, limitations, alternatives and imponderables have been discussed.  The potential for biopsy, esophogeal dilation, etc. have also been reviewed.  Questions have been answered.  All parties agreeable.  Please see the history and physical in the medical record for more information.  MEDICATIONS:     Demerol 100 mg IV and Versed 6 mg IV in divided doses. Zofran 4 mg IV. Cetacaine spray.  DESCRIPTION OF PROCEDURE:   The EG-[id number] ([id number])  endoscope was introduced through the mouth and advanced to the second portion of the duodenum without difficulty or limitations.  The mucosal surfaces were surveyed very carefully during advancement of the scope and upon withdrawal.  Retroflexion view of the proximal stomach and esophagogastric junction was performed.      FINDINGS: Normal esophagus. Stomach empty. Small hiatal hernia. Patient had a 3 mm deep prepyloric/antral ulcer with surrounding edema and erosions.  Patent pylorus. Normal first and second portion of the duodenum  THERAPEUTIC / DIAGNOSTIC MANEUVERS PERFORMED:  The above-mentioned ulcers biopsied.   COMPLICATIONS:  None  IMPRESSION:   Prepyloric antral ulcer status post biopsy. Hiatal hernia.  RECOMMENDATIONS:  Would be best to avoid all NSAIDs including meloxicam. Begin Protonix 40 mg twice daily. Followup on pathology. See colonoscopy report.    _______________________________ R. Roetta Sessions, MD FACP [facility] eSigned:  R. Roetta Sessions, MD FACP [facility] 06/08/2013 8:30 AM     CC:

## 2013-06-08 NOTE — Op Note (Signed)
[facility] [address] [place], [address]   COLONOSCOPY PROCEDURE REPORT  PATIENT: [name], [name]  MR#:         [id number] BIRTHDATE: 01-22-1956 , 57  yrs. old GENDER: Female ENDOSCOPIST: R.  Roetta Sessions, MD FACP [facility] REFERRED BY:  Justin Mend, P.A. PROCEDURE DATE:  06/08/2013 PROCEDURE:     Colonoscopy with biopsy  INDICATIONS:  history diverticulitis; first ever colonoscopy; chronic constipation  INFORMED CONSENT:  The risks, benefits, alternatives and imponderables including but not limited to bleeding, perforation as well as the possibility of a missed lesion have been reviewed.  The potential for biopsy, lesion removal, etc. have also been discussed.  Questions have been answered.  All parties agreeable. Please see the history and physical in the medical record for more information.  MEDICATIONS: Versed 10 mg IV and Demerol 200 mg IV in divided doses. Zofran 4 mg IV.  DESCRIPTION OF PROCEDURE:  After a digital rectal exam was performed, the EC-[id number] ([id number])  colonoscope was advanced from the anus through the rectum and colon to the area of the cecum, ileocecal valve and appendiceal orifice.  The cecum was deeply intubated.  These structures were well-seen and photographed for the record.  From the level of the cecum and ileocecal valve, the scope was slowly and cautiously withdrawn.  The mucosal surfaces were carefully surveyed utilizing scope tip deflection to facilitate fold flattening as needed.  The scope was pulled down into the rectum where a thorough examination including retroflexion was performed.    FINDINGS:  Adequate preparation. Anal papilla and internal hemorrhoids; otherwise normal rectum. Pancolonic diverticulosis. What was going for a number of maneuvers including external abdominal pressure and changing of the patient's position to reach the cecum. Patient had (1) diminutive polyp in the base of cecum; otherwise, the  remainder of the colonic mucosa appeared normal.  THERAPEUTIC / DIAGNOSTIC MANEUVERS PERFORMED:  The above-mentioned polyps cold biopsied/removed.  COMPLICATIONS:  None  CECAL WITHDRAWAL TIME:  10 minutes  IMPRESSION:  Tortuous colon. Internal hemorrhoids. Colonic diverticulosis. Single colonic polyp removed as described above.  RECOMMENDATIONS: Stop Amitiza as it is not working.. Begin Linzess 290 daily.  Benefiber 2 Teaspoons Twice Daily. Followup on Pathology Seek EGD Report.   _______________________________ eSigned:  R. Roetta Sessions, MD FACP [facility] 06/08/2013 9:15 AM   CC:

## 2013-06-08 NOTE — H&P (View-Only) (Signed)
Primary Care Physician:  Gracelyn Nurse, PA-C  Primary Gastroenterologist:  Roetta Sessions, MD   Chief Complaint  Patient presents with  . Colonoscopy    HPI:  [name] is a 57 y.o. female here to schedule colonoscopy. Back in July 2014 she had abdominal pain and was treated empirically for diverticulitis. 2 days after starting antibiotics she underwent a CT scan which showed diverticulosis but otherwise unremarkable. Had pain for about one month prior to seeking medical attention.   Chronic left lower abdominal pain. Worse when she is constipated. Takes MOM liquid three times per week just to go to the bathroom. She cannot remember when she was able to have a bowel movement without laxatives. Suprapubic and LLQ pain if has to strain and passes large stool. If keeps stools loose, then less pain. Never tried Miralax. Lot of bowel issues in mother, daughter as well. No brbpr, melena. Lot of gas. No heartburn, dysphagia. Since June has lost 40 pounds, unintentional. Early satiety. H/O stomach problems in the remote past when she was taking a lot of aspirin products. Currently takes a daily aspirin and Mobic. Vomits if drinks red wine or eats red stuff.  Current Outpatient Prescriptions  Medication Sig Dispense Refill  . aspirin EC 325 MG EC tablet Take 1 tablet (325 mg total) by mouth daily with breakfast.  30 tablet  0  . fluticasone (FLONASE) 50 MCG/ACT nasal spray Place 1 spray into the nose 2 (two) times daily.      . furosemide (LASIX) 20 MG tablet Take 20 mg by mouth daily.      Marland Kitchen gemfibrozil (LOPID) 600 MG tablet Take 600 mg by mouth 2 (two) times daily before a meal.      . HYDROcodone-acetaminophen (NORCO) 7.5-325 MG per tablet Take 1 tablet by mouth every 6 (six) hours as needed for pain.      Marland Kitchen lisinopril (PRINIVIL,ZESTRIL) 20 MG tablet Take 20 mg by mouth daily.      Marland Kitchen lovastatin (MEVACOR) 20 MG tablet Take 20 mg by mouth at bedtime.      . meloxicam (MOBIC) 7.5 MG tablet  Take 7.5 mg by mouth daily.      . Omega-3 Fatty Acids (FISH OIL PO) Take 1 tablet by mouth 3 (three) times a week.      . Potassium Gluconate 550 MG TABS Take 1-2 tablets by mouth daily as needed. Takes with fluid pill. May take extra tablet due to cramping in legs.       No current facility-administered medications for this visit.    Allergies as of 05/21/2013 - Review Complete 05/21/2013  Allergen Reaction Noted  . Clindamycin/lincomycin Swelling 01/29/2013  . Codeine Itching 01/23/2013  . Levaquin [levofloxacin] Swelling 01/29/2013    Past Medical History  Diagnosis Date  . Hypertension   . High cholesterol   . Fluid retention   . Hypokalemia   . Chronic back pain   . Pneumonia     hospitalized in March  . Arthritis   . Cancer 1992    cervical    Past Surgical History  Procedure Laterality Date  . Abdominal hysterectomy    . Cholecystectomy    . Knee surgery      right and left  . Total knee arthroplasty Right 01/29/2013    Procedure: TOTAL KNEE ARTHROPLASTY WITH HARDWARE REMOVAL;  Surgeon: [name], MD;  Location: [facility] OR;  Service: Orthopedics;  Laterality: Right;  DEPUY/SIGMA RP, SYNTHES SCREWDRIVERS    Family  History  Problem Relation Age of Onset  . Ulcers Father   . Colon cancer Neg Hx     History   Social History  . Marital Status: Widowed    Spouse Name: N/A    Number of Children: N/A  . Years of Education: N/A   Occupational History  . Not on file.   Social History Main Topics  . Smoking status: Current Every Day Smoker -- 0.50 packs/day for 43 years    Types: Cigarettes  . Smokeless tobacco: Not on file  . Alcohol Use: Yes     Comment: occasional  . Drug Use: No  . Sexual Activity: Not on file   Other Topics Concern  . Not on file   Social History Narrative  . No narrative on file      ROS:  General: Negative for anorexia, fever, chills, fatigue, weakness. See history of present illness Eyes: Negative for vision changes.  ENT:  Negative for hoarseness, difficulty swallowing , nasal congestion. CV: Negative for chest pain, angina, palpitations, dyspnea on exertion, peripheral edema.  Respiratory: Negative for dyspnea at rest, dyspnea on exertion, cough, sputum, wheezing.  GI: See history of present illness. GU:  Negative for dysuria, hematuria, urinary incontinence, urinary frequency, nocturnal urination.  MS: Negative for joint pain, low back pain.  Derm: Negative for rash or itching.  Neuro: Negative for weakness, abnormal sensation, seizure, frequent headaches, memory loss, confusion.  Psych: Negative for anxiety, depression, suicidal ideation, hallucinations.  Endo: See history of present illness Heme: Negative for bruising or bleeding. Allergy: Negative for rash or hives.    Physical Examination:  BP 160/110  Pulse 82  Temp(Src) 97.9 F (36.6 C) (Oral)  Ht 5\' 6"  (1.676 m)  Wt 224 lb (101.606 kg)  BMI 36.17 kg/m2   General: Well-nourished, well-developed in no acute distress.  Head: Normocephalic, atraumatic.   Eyes: Conjunctiva pink, no icterus. Mouth: Oropharyngeal mucosa moist and pink , no lesions erythema or exudate. Neck: Supple without thyromegaly, masses, or lymphadenopathy.  Lungs: Clear to auscultation bilaterally.  Heart: Regular rate and rhythm, no murmurs rubs or gallops.  Abdomen: Bowel sounds are normal, mild left lower quadrant tenderness, nondistended, no hepatosplenomegaly or masses, no abdominal bruits or    hernia , no rebound or guarding.   Rectal: Not performed Extremities: No lower extremity edema. No clubbing or deformities.  Neuro: Alert and oriented x 4 , grossly normal neurologically.  Skin: Warm and dry, no rash or jaundice.   Psych: Alert and cooperative, normal mood and affect.    Imaging Studies: CT abdomen pelvis with contrast 03/22/2013  IMPRESSION:  Diverticulosis of colon without diverticulitis. No acute  abnormality is identified in the abdomen and  pelvis.

## 2013-06-08 NOTE — Interval H&P Note (Signed)
History and Physical Interval Note:  06/08/2013 8:02 AM  [name]  has presented today for surgery, with the diagnosis of LLQ PAIN , CHRONIC CONSTIPATION, WEIGHT LOSS, EARLY SATIETY, DIVERTICULOSIS  The various methods of treatment have been discussed with the patient and family. After consideration of risks, benefits and other options for treatment, the patient has consented to  Procedure(s) with comments: COLONOSCOPY WITH ESOPHAGOGASTRODUODENOSCOPY (EGD) (N/A) - 7:30-moved to 8:00am Dr. Request Time as a surgical intervention .  The patient's history has been reviewed, patient examined, no change in status, stable for surgery.  I have reviewed the patient's chart and labs.  Questions were answered to the patient's satisfaction.     EGD and colonoscopy per plan and Amitiza has not helped.  The risks, benefits, limitations, imponderables and alternatives regarding both EGD and colonoscopy have been reviewed with the patient. Questions have been answered. All parties agreeable.   Eula Listen

## 2013-06-09 NOTE — H&P (Signed)
TOTAL KNEE ADMISSION H&P  Patient is being admitted for left total knee arthroplasty.  Subjective:  Chief Complaint:left knee pain.  HPI: [name], 57 y.o. female, has a history of pain and functional disability in the left knee due to arthritis and has failed non-surgical conservative treatments for greater than 12 weeks to includeNSAID's and/or analgesics, corticosteriod injections, viscosupplementation injections, flexibility and strengthening excercises and activity modification.  Onset of symptoms was gradual, starting many years ago with gradually worsening course since that time. The patient noted no past surgery on the left knee(s).  Patient currently rates pain in the left knee(s) at 10 out of 10 with activity. Patient has night pain, worsening of pain with activity and weight bearing and pain that interferes with activities of daily living.  Patient has evidence of joint space narrowing by imaging studies. There is no active infection.  Patient Active Problem List   Diagnosis Date Noted  . Constipation, chronic 05/21/2013  . Abdominal pain, left lower quadrant 05/21/2013  . Diverticulosis of colon without hemorrhage 05/21/2013  . Abnormal weight loss 05/21/2013  . Early satiety 05/21/2013  . Osteoarthritis of right knee 01/28/2013   Past Medical History  Diagnosis Date  . Hypertension   . High cholesterol   . Fluid retention   . Hypokalemia   . Chronic back pain   . Arthritis   . Pneumonia     hospitalized in March  . Cancer 1992    cervical  . Diverticulosis     Past Surgical History  Procedure Laterality Date  . Abdominal hysterectomy    . Cholecystectomy    . Knee surgery      right and left  . Total knee arthroplasty Right 01/29/2013    Procedure: TOTAL KNEE ARTHROPLASTY WITH HARDWARE REMOVAL;  Surgeon: [name], MD;  Location: [facility] OR;  Service: Orthopedics;  Laterality: Right;  DEPUY/SIGMA RP, SYNTHES SCREWDRIVERS    No prescriptions prior to  admission   Allergies  Allergen Reactions  . Clindamycin/Lincomycin Swelling    Swelling feet  . Codeine Itching  . Levaquin [Levofloxacin] Swelling    History  Substance Use Topics  . Smoking status: Current Every Day Smoker -- 0.50 packs/day for 43 years    Types: Cigarettes  . Smokeless tobacco: Not on file  . Alcohol Use: Yes     Comment: occasional    Family History  Problem Relation Age of Onset  . Ulcers Father   . Colon cancer Neg Hx   . Kidney cancer Neg Hx      Review of Systems  Constitutional: Negative.   HENT: Negative.   Respiratory: Negative.   Cardiovascular: Negative.   Gastrointestinal: Negative.   Genitourinary: Negative.   Musculoskeletal: Positive for joint pain.  Skin: Negative.   Neurological: Negative.   Endo/Heme/Allergies: Negative.   Psychiatric/Behavioral: Negative.     Objective:  Physical Exam  Constitutional: She is oriented to person, place, and time. She appears well-developed and well-nourished.  HENT:  Head: Normocephalic and atraumatic.  Eyes: Pupils are equal, round, and reactive to light.  Neck: Normal range of motion. Neck supple.  Cardiovascular: Intact distal pulses.   Respiratory: Effort normal.  Musculoskeletal: She exhibits tenderness.  Neurological: She is alert and oriented to person, place, and time. She has normal reflexes.  Skin: Skin is warm and dry.  Psychiatric: She has a normal mood and affect. Her behavior is normal. Judgment and thought content normal.    Vital signs in last 24  hours:    Labs:   Estimated body mass index is 38.80 kg/(m^2) as calculated from the following:   Height as of 01/23/13: 5\' 6"  (1.676 m).   Weight as of 01/23/13: 108.999 kg (240 lb 4.8 oz).   Imaging Review X-rays are reviewed with the patient showing medial compartment arthritis with early lateral subluxation of tibia beneath the femur.  Assessment/Plan:  End stage arthritis, left knee   The patient history, physical  examination, clinical judgment of the provider and imaging studies are consistent with end stage degenerative joint disease of the left knee(s) and total knee arthroplasty is deemed medically necessary. The treatment options including medical management, injection therapy arthroscopy and arthroplasty were discussed at length. The risks and benefits of total knee arthroplasty were presented and reviewed. The risks due to aseptic loosening, infection, stiffness, patella tracking problems, thromboembolic complications and other imponderables were discussed. The patient acknowledged the explanation, agreed to proceed with the plan and consent was signed. Patient is being admitted for inpatient treatment for surgery, pain control, PT, OT, prophylactic antibiotics, VTE prophylaxis, progressive ambulation and ADL's and discharge planning. The patient is planning to be discharged home with home health services.

## 2013-06-10 MED ORDER — CEFAZOLIN SODIUM-DEXTROSE 2-3 GM-% IV SOLR
2.0000 g | INTRAVENOUS | Status: AC
  Administered 2013-06-11: 2 g via INTRAVENOUS
  Filled 2013-06-10: qty 50

## 2013-06-11 ENCOUNTER — Inpatient Hospital Stay (HOSPITAL_COMMUNITY)
Admission: RE | Admit: 2013-06-11 | Discharge: 2013-06-12 | DRG: 470 | Disposition: A | Discharge status: Home Health | Payer: BC Managed Care – PPO | Source: Ambulatory Visit | Attending: Orthopedic Surgery | Admitting: Orthopedic Surgery

## 2013-06-11 ENCOUNTER — Encounter (HOSPITAL_COMMUNITY)
Admission: RE | Disposition: A | Discharge status: Home Health | Payer: Self-pay | Source: Ambulatory Visit | Attending: Orthopedic Surgery

## 2013-06-11 ENCOUNTER — Encounter (HOSPITAL_COMMUNITY): Payer: Self-pay | Admitting: *Deleted

## 2013-06-11 ENCOUNTER — Encounter (HOSPITAL_COMMUNITY): Payer: BC Managed Care – PPO | Admitting: Vascular Surgery

## 2013-06-11 ENCOUNTER — Inpatient Hospital Stay (HOSPITAL_COMMUNITY): Payer: BC Managed Care – PPO | Admitting: Anesthesiology

## 2013-06-11 DIAGNOSIS — I10 Essential (primary) hypertension: Secondary | ICD-10-CM | POA: Diagnosis present

## 2013-06-11 DIAGNOSIS — Z23 Encounter for immunization: Secondary | ICD-10-CM

## 2013-06-11 DIAGNOSIS — E7800 Pure hypercholesterolemia, unspecified: Secondary | ICD-10-CM | POA: Diagnosis present

## 2013-06-11 DIAGNOSIS — K5900 Constipation, unspecified: Secondary | ICD-10-CM | POA: Diagnosis present

## 2013-06-11 DIAGNOSIS — F17200 Nicotine dependence, unspecified, uncomplicated: Secondary | ICD-10-CM | POA: Diagnosis present

## 2013-06-11 DIAGNOSIS — M1710 Unilateral primary osteoarthritis, unspecified knee: Principal | ICD-10-CM | POA: Diagnosis present

## 2013-06-11 DIAGNOSIS — Z7982 Long term (current) use of aspirin: Secondary | ICD-10-CM

## 2013-06-11 DIAGNOSIS — K5730 Diverticulosis of large intestine without perforation or abscess without bleeding: Secondary | ICD-10-CM | POA: Diagnosis present

## 2013-06-11 DIAGNOSIS — G8929 Other chronic pain: Secondary | ICD-10-CM | POA: Diagnosis present

## 2013-06-11 DIAGNOSIS — Z96659 Presence of unspecified artificial knee joint: Secondary | ICD-10-CM

## 2013-06-11 DIAGNOSIS — Z881 Allergy status to other antibiotic agents status: Secondary | ICD-10-CM

## 2013-06-11 DIAGNOSIS — Z79899 Other long term (current) drug therapy: Secondary | ICD-10-CM

## 2013-06-11 DIAGNOSIS — Z9089 Acquired absence of other organs: Secondary | ICD-10-CM

## 2013-06-11 DIAGNOSIS — M1712 Unilateral primary osteoarthritis, left knee: Secondary | ICD-10-CM

## 2013-06-11 HISTORY — DX: Peptic ulcer, site unspecified, unspecified as acute or chronic, without hemorrhage or perforation: K27.9

## 2013-06-11 HISTORY — PX: TOTAL KNEE ARTHROPLASTY: SHX125

## 2013-06-11 LAB — TYPE AND SCREEN
ABO/RH(D): A POS
Antibody Screen: POSITIVE

## 2013-06-11 SURGERY — ARTHROPLASTY, KNEE, TOTAL
Anesthesia: General | Site: Knee | Laterality: Left | Wound class: Clean

## 2013-06-11 MED ORDER — MENTHOL 3 MG MT LOZG: 1.0000 | LOZENGE | OROMUCOSAL | Status: DC | PRN

## 2013-06-11 MED ORDER — ACETAMINOPHEN 650 MG RE SUPP: 650.0000 mg | Freq: Four times a day (QID) | RECTAL | Status: DC | PRN

## 2013-06-11 MED ORDER — METOCLOPRAMIDE HCL 10 MG PO TABS: 5.0000 mg | ORAL_TABLET | Freq: Three times a day (TID) | ORAL | Status: DC | PRN

## 2013-06-11 MED ORDER — PHENOL 1.4 % MT LIQD: 1.0000 | OROMUCOSAL | Status: DC | PRN

## 2013-06-11 MED ORDER — HYDROMORPHONE HCL PF 1 MG/ML IJ SOLN
INTRAMUSCULAR | Status: AC
  Filled 2013-06-11: qty 1

## 2013-06-11 MED ORDER — METHOCARBAMOL 100 MG/ML IJ SOLN
500.0000 mg | Freq: Four times a day (QID) | INTRAVENOUS | Status: DC | PRN
  Filled 2013-06-11: qty 5

## 2013-06-11 MED ORDER — ONDANSETRON HCL 4 MG/2ML IJ SOLN
INTRAMUSCULAR | Status: DC | PRN
  Administered 2013-06-11: 4 mg via INTRAMUSCULAR

## 2013-06-11 MED ORDER — MAGNESIUM CITRATE PO SOLN
1.0000 | Freq: Once | ORAL | Status: AC | PRN
  Filled 2013-06-11: qty 296

## 2013-06-11 MED ORDER — CEFUROXIME SODIUM 1.5 G IJ SOLR
INTRAMUSCULAR | Status: AC
  Filled 2013-06-11: qty 1.5

## 2013-06-11 MED ORDER — ACETAMINOPHEN 325 MG PO TABS: 650.0000 mg | ORAL_TABLET | Freq: Four times a day (QID) | ORAL | Status: DC | PRN

## 2013-06-11 MED ORDER — POTASSIUM GLUCONATE 550 MG PO TABS: 1.0000 | ORAL_TABLET | Freq: Every day | ORAL | Status: DC | PRN

## 2013-06-11 MED ORDER — ALUM & MAG HYDROXIDE-SIMETH 200-200-20 MG/5ML PO SUSP: 30.0000 mL | ORAL | Status: DC | PRN

## 2013-06-11 MED ORDER — GLYCOPYRROLATE 0.2 MG/ML IJ SOLN
INTRAMUSCULAR | Status: DC | PRN
  Administered 2013-06-11: 0.4 mg via INTRAVENOUS

## 2013-06-11 MED ORDER — BUPIVACAINE-EPINEPHRINE PF 0.5-1:200000 % IJ SOLN
INTRAMUSCULAR | Status: DC | PRN
  Administered 2013-06-11: 100 mg

## 2013-06-11 MED ORDER — CEFUROXIME SODIUM 1.5 G IJ SOLR
INTRAMUSCULAR | Status: DC | PRN
  Administered 2013-06-11: 1.5 g

## 2013-06-11 MED ORDER — INFLUENZA VAC SPLIT QUAD 0.5 ML IM SUSP: 0.5000 mL | INTRAMUSCULAR | Status: DC

## 2013-06-11 MED ORDER — HYDROMORPHONE HCL PF 1 MG/ML IJ SOLN
0.2500 mg | INTRAMUSCULAR | Status: DC | PRN
  Administered 2013-06-11 (×4): 0.5 mg via INTRAVENOUS

## 2013-06-11 MED ORDER — NICOTINE 14 MG/24HR TD PT24
14.0000 mg | MEDICATED_PATCH | Freq: Every day | TRANSDERMAL | Status: DC
  Administered 2013-06-11 – 2013-06-12 (×2): 14 mg via TRANSDERMAL
  Filled 2013-06-11 (×2): qty 1

## 2013-06-11 MED ORDER — PROMETHAZINE HCL 25 MG/ML IJ SOLN: 6.2500 mg | INTRAMUSCULAR | Status: DC | PRN

## 2013-06-11 MED ORDER — CHLORHEXIDINE GLUCONATE 4 % EX LIQD: 60.0000 mL | Freq: Once | CUTANEOUS | Status: DC

## 2013-06-11 MED ORDER — KCL IN DEXTROSE-NACL 20-5-0.45 MEQ/L-%-% IV SOLN
INTRAVENOUS | Status: DC
  Administered 2013-06-11: 12:00:00 via INTRAVENOUS
  Filled 2013-06-11 (×6): qty 1000

## 2013-06-11 MED ORDER — LISINOPRIL 20 MG PO TABS
20.0000 mg | ORAL_TABLET | Freq: Every day | ORAL | Status: DC
Start: 2013-06-11 — End: 2013-06-12
  Administered 2013-06-11 – 2013-06-12 (×2): 20 mg via ORAL
  Filled 2013-06-11 (×2): qty 1

## 2013-06-11 MED ORDER — SODIUM CHLORIDE 0.9 % IJ SOLN
INTRAMUSCULAR | Status: DC | PRN
  Administered 2013-06-11: 08:00:00

## 2013-06-11 MED ORDER — MIDAZOLAM HCL 5 MG/5ML IJ SOLN
INTRAMUSCULAR | Status: DC | PRN
  Administered 2013-06-11: 2 mg via INTRAVENOUS

## 2013-06-11 MED ORDER — ONDANSETRON HCL 4 MG/2ML IJ SOLN: 4.0000 mg | Freq: Four times a day (QID) | INTRAMUSCULAR | Status: DC | PRN

## 2013-06-11 MED ORDER — DIPHENHYDRAMINE HCL 12.5 MG/5ML PO ELIX: 12.5000 mg | ORAL_SOLUTION | ORAL | Status: DC | PRN

## 2013-06-11 MED ORDER — FENTANYL CITRATE 0.05 MG/ML IJ SOLN
INTRAMUSCULAR | Status: DC | PRN
  Administered 2013-06-11 (×4): 50 ug via INTRAVENOUS
  Administered 2013-06-11: 100 ug via INTRAVENOUS
  Administered 2013-06-11 (×2): 50 ug via INTRAVENOUS

## 2013-06-11 MED ORDER — METHOCARBAMOL 500 MG PO TABS
500.0000 mg | ORAL_TABLET | Freq: Four times a day (QID) | ORAL | Status: DC | PRN
  Administered 2013-06-11 – 2013-06-12 (×2): 500 mg via ORAL
  Filled 2013-06-11 (×2): qty 1

## 2013-06-11 MED ORDER — DEXAMETHASONE SODIUM PHOSPHATE 4 MG/ML IJ SOLN
INTRAMUSCULAR | Status: DC | PRN
  Administered 2013-06-11: 4 mg

## 2013-06-11 MED ORDER — BUPIVACAINE LIPOSOME 1.3 % IJ SUSP
20.0000 mL | Freq: Once | INTRAMUSCULAR | Status: DC
  Filled 2013-06-11: qty 20

## 2013-06-11 MED ORDER — TRANEXAMIC ACID 100 MG/ML IV SOLN
1000.0000 mg | INTRAVENOUS | Status: AC
  Administered 2013-06-11: 1000 mg via INTRAVENOUS
  Filled 2013-06-11: qty 10

## 2013-06-11 MED ORDER — FUROSEMIDE 20 MG PO TABS
20.0000 mg | ORAL_TABLET | Freq: Every day | ORAL | Status: DC
  Administered 2013-06-11 – 2013-06-12 (×2): 20 mg via ORAL
  Filled 2013-06-11 (×2): qty 1

## 2013-06-11 MED ORDER — SODIUM CHLORIDE 0.9 % IR SOLN
Status: DC | PRN
  Administered 2013-06-11: 3000 mL

## 2013-06-11 MED ORDER — LINACLOTIDE 290 MCG PO CAPS
290.0000 ug | ORAL_CAPSULE | Freq: Every day | ORAL | Status: DC
  Administered 2013-06-11 – 2013-06-12 (×2): 290 ug via ORAL
  Filled 2013-06-11 (×3): qty 1

## 2013-06-11 MED ORDER — METOCLOPRAMIDE HCL 5 MG/ML IJ SOLN: 5.0000 mg | Freq: Three times a day (TID) | INTRAMUSCULAR | Status: DC | PRN

## 2013-06-11 MED ORDER — FLUTICASONE PROPIONATE 50 MCG/ACT NA SUSP
1.0000 | Freq: Two times a day (BID) | NASAL | Status: DC
  Administered 2013-06-11 (×2): 1 via NASAL
  Filled 2013-06-11: qty 16

## 2013-06-11 MED ORDER — SENNOSIDES-DOCUSATE SODIUM 8.6-50 MG PO TABS: 1.0000 | ORAL_TABLET | Freq: Every evening | ORAL | Status: DC | PRN

## 2013-06-11 MED ORDER — PROPOFOL 10 MG/ML IV BOLUS
INTRAVENOUS | Status: DC | PRN
  Administered 2013-06-11: 200 mg via INTRAVENOUS

## 2013-06-11 MED ORDER — NEOSTIGMINE METHYLSULFATE 1 MG/ML IJ SOLN
INTRAMUSCULAR | Status: DC | PRN
  Administered 2013-06-11: 3 mg via INTRAVENOUS

## 2013-06-11 MED ORDER — LUBIPROSTONE 24 MCG PO CAPS
24.0000 ug | ORAL_CAPSULE | Freq: Two times a day (BID) | ORAL | Status: DC
  Administered 2013-06-11 – 2013-06-12 (×2): 24 ug via ORAL
  Filled 2013-06-11 (×4): qty 1

## 2013-06-11 MED ORDER — DEXTROSE-NACL 5-0.45 % IV SOLN: INTRAVENOUS | Status: DC

## 2013-06-11 MED ORDER — ASPIRIN EC 325 MG PO TBEC
325.0000 mg | DELAYED_RELEASE_TABLET | Freq: Every day | ORAL | Status: DC
  Administered 2013-06-12: 325 mg via ORAL
  Filled 2013-06-11 (×2): qty 1

## 2013-06-11 MED ORDER — BISACODYL 5 MG PO TBEC: 5.0000 mg | DELAYED_RELEASE_TABLET | Freq: Every day | ORAL | Status: DC | PRN

## 2013-06-11 MED ORDER — DOCUSATE SODIUM 100 MG PO CAPS
100.0000 mg | ORAL_CAPSULE | Freq: Two times a day (BID) | ORAL | Status: DC
  Administered 2013-06-11 – 2013-06-12 (×3): 100 mg via ORAL
  Filled 2013-06-11 (×3): qty 1

## 2013-06-11 MED ORDER — ROCURONIUM BROMIDE 100 MG/10ML IV SOLN
INTRAVENOUS | Status: DC | PRN
  Administered 2013-06-11: 50 mg via INTRAVENOUS

## 2013-06-11 MED ORDER — PANTOPRAZOLE SODIUM 40 MG PO TBEC
40.0000 mg | DELAYED_RELEASE_TABLET | Freq: Two times a day (BID) | ORAL | Status: DC
  Administered 2013-06-11 – 2013-06-12 (×3): 40 mg via ORAL
  Filled 2013-06-11 (×3): qty 1

## 2013-06-11 MED ORDER — ONDANSETRON HCL 4 MG PO TABS: 4.0000 mg | ORAL_TABLET | Freq: Four times a day (QID) | ORAL | Status: DC | PRN

## 2013-06-11 MED ORDER — HYDROMORPHONE HCL PF 1 MG/ML IJ SOLN
0.5000 mg | INTRAMUSCULAR | Status: DC | PRN
  Administered 2013-06-11 (×2): 0.5 mg via INTRAVENOUS
  Filled 2013-06-11 (×2): qty 1

## 2013-06-11 MED ORDER — LACTATED RINGERS IV SOLN
INTRAVENOUS | Status: DC | PRN
  Administered 2013-06-11: 07:00:00 via INTRAVENOUS

## 2013-06-11 MED ORDER — OXYCODONE HCL 5 MG PO TABS
5.0000 mg | ORAL_TABLET | ORAL | Status: DC | PRN
  Administered 2013-06-11 – 2013-06-12 (×4): 10 mg via ORAL
  Filled 2013-06-11 (×4): qty 2

## 2013-06-11 SURGICAL SUPPLY — 59 items
BANDAGE ESMARK 6X9 LF (GAUZE/BANDAGES/DRESSINGS) ×1 IMPLANT
BLADE SAG 18X100X1.27 (BLADE) ×2 IMPLANT
BLADE SAW SGTL 13X75X1.27 (BLADE) ×2 IMPLANT
BLADE SURG ROTATE 9660 (MISCELLANEOUS) IMPLANT
BNDG CMPR 9X6 STRL LF SNTH (GAUZE/BANDAGES/DRESSINGS) ×1
BNDG CMPR MED 10X6 ELC LF (GAUZE/BANDAGES/DRESSINGS) ×1
BNDG ELASTIC 6X10 VLCR STRL LF (GAUZE/BANDAGES/DRESSINGS) ×2 IMPLANT
BNDG ESMARK 6X9 LF (GAUZE/BANDAGES/DRESSINGS) ×2
BOWL SMART MIX CTS (DISPOSABLE) ×2 IMPLANT
CAPT RP KNEE ×1 IMPLANT
CEMENT HV SMART SET (Cement) ×4 IMPLANT
CLOTH BEACON ORANGE TIMEOUT ST (SAFETY) ×1 IMPLANT
COVER SURGICAL LIGHT HANDLE (MISCELLANEOUS) ×2 IMPLANT
CUFF TOURNIQUET SINGLE 34IN LL (TOURNIQUET CUFF) ×1 IMPLANT
CUFF TOURNIQUET SINGLE 44IN (TOURNIQUET CUFF) IMPLANT
DRAPE EXTREMITY T 121X128X90 (DRAPE) ×2 IMPLANT
DRAPE U-SHAPE 47X51 STRL (DRAPES) ×2 IMPLANT
DURAPREP 26ML APPLICATOR (WOUND CARE) ×2 IMPLANT
ELECT REM PT RETURN 9FT ADLT (ELECTROSURGICAL) ×2
ELECTRODE REM PT RTRN 9FT ADLT (ELECTROSURGICAL) ×1 IMPLANT
EVACUATOR 1/8 PVC DRAIN (DRAIN) ×2 IMPLANT
GAUZE XEROFORM 1X8 LF (GAUZE/BANDAGES/DRESSINGS) ×2 IMPLANT
GLOVE BIO SURGEON STRL SZ7.5 (GLOVE) ×2 IMPLANT
GLOVE BIO SURGEON STRL SZ8.5 (GLOVE) ×4 IMPLANT
GLOVE BIOGEL PI IND STRL 8 (GLOVE) ×2 IMPLANT
GLOVE BIOGEL PI IND STRL 9 (GLOVE) ×1 IMPLANT
GLOVE BIOGEL PI INDICATOR 8 (GLOVE) ×2
GLOVE BIOGEL PI INDICATOR 9 (GLOVE) ×1
GOWN PREVENTION PLUS XLARGE (GOWN DISPOSABLE) ×2 IMPLANT
GOWN STRL NON-REIN LRG LVL3 (GOWN DISPOSABLE) ×2 IMPLANT
GOWN STRL REIN XL XLG (GOWN DISPOSABLE) ×4 IMPLANT
HANDPIECE INTERPULSE COAX TIP (DISPOSABLE) ×2
HOOD PEEL AWAY FACE SHEILD DIS (HOOD) ×5 IMPLANT
KIT BASIN OR (CUSTOM PROCEDURE TRAY) ×2 IMPLANT
KIT ROOM TURNOVER OR (KITS) ×2 IMPLANT
MANIFOLD NEPTUNE II (INSTRUMENTS) ×2 IMPLANT
NDL SAFETY ECLIPSE 18X1.5 (NEEDLE) IMPLANT
NDL SPNL 18GX3.5 QUINCKE PK (NEEDLE) IMPLANT
NEEDLE HYPO 18GX1.5 SHARP (NEEDLE)
NEEDLE SPNL 18GX3.5 QUINCKE PK (NEEDLE) ×2 IMPLANT
NS IRRIG 1000ML POUR BTL (IV SOLUTION) ×2 IMPLANT
PACK TOTAL JOINT (CUSTOM PROCEDURE TRAY) ×2 IMPLANT
PAD ARMBOARD 7.5X6 YLW CONV (MISCELLANEOUS) ×4 IMPLANT
PADDING CAST COTTON 6X4 STRL (CAST SUPPLIES) ×2 IMPLANT
SET HNDPC FAN SPRY TIP SCT (DISPOSABLE) ×1 IMPLANT
SPONGE GAUZE 4X4 12PLY (GAUZE/BANDAGES/DRESSINGS) ×4 IMPLANT
STAPLER VISISTAT 35W (STAPLE) ×2 IMPLANT
SUCTION FRAZIER TIP 10 FR DISP (SUCTIONS) ×2 IMPLANT
SUT VIC AB 0 CTX 36 (SUTURE) ×2
SUT VIC AB 0 CTX36XBRD ANTBCTR (SUTURE) ×1 IMPLANT
SUT VIC AB 1 CTX 36 (SUTURE) ×2
SUT VIC AB 1 CTX36XBRD ANBCTR (SUTURE) ×1 IMPLANT
SUT VIC AB 2-0 CT1 27 (SUTURE) ×2
SUT VIC AB 2-0 CT1 TAPERPNT 27 (SUTURE) ×1 IMPLANT
SYR 50ML LL SCALE MARK (SYRINGE) ×2 IMPLANT
TOWEL OR 17X24 6PK STRL BLUE (TOWEL DISPOSABLE) ×2 IMPLANT
TOWEL OR 17X26 10 PK STRL BLUE (TOWEL DISPOSABLE) ×2 IMPLANT
TRAY FOLEY CATH 14FR (SET/KITS/TRAYS/PACK) ×1 IMPLANT
WATER STERILE IRR 1000ML POUR (IV SOLUTION) ×4 IMPLANT

## 2013-06-11 NOTE — Transfer of Care (Signed)
Immediate Anesthesia Transfer of Care Note  Patient: [name]  Procedure(s) Performed: Procedure(s): TOTAL KNEE ARTHROPLASTY (Left)  Patient Location: PACU  Anesthesia Type:General  Level of Consciousness: awake, alert  and oriented  Airway & Oxygen Therapy: Patient Spontanous Breathing and Patient connected to nasal cannula oxygen  Post-op Assessment: Report given to PACU RN, Post -op Vital signs reviewed and stable and Patient moving all extremities X 4  Post vital signs: Reviewed and stable  Complications: No apparent anesthesia complications

## 2013-06-11 NOTE — Preoperative (Signed)
Beta Blockers   Reason not to administer Beta Blockers:Not Applicable 

## 2013-06-11 NOTE — Anesthesia Postprocedure Evaluation (Signed)
Anesthesia Post Note  Patient: [name]  Procedure(s) Performed: Procedure(s) (LRB): TOTAL KNEE ARTHROPLASTY (Left)  Anesthesia type: general  Patient location: PACU  Post pain: Pain level controlled  Post assessment: Patient's Cardiovascular Status Stable  Last Vitals:  Filed Vitals:   06/11/13 1005  BP: 160/133  Pulse: 85  Temp:   Resp: 25    Post vital signs: Reviewed and stable  Level of consciousness: sedated  Complications: No apparent anesthesia complications

## 2013-06-11 NOTE — Evaluation (Signed)
Physical Therapy Evaluation Patient Details Name: [name] MRN: [id number] DOB: 12-02-1955 Today's Date: 06/11/2013 Time: 4098-1191 PT Time Calculation (min): 30 min  PT Assessment / Plan / Recommendation History of Present Illness  s/p elective Lt TKA . Pt had right TKA in June 2014  Clinical Impression  Pt is s/p TKA resulting in the deficits listed below (see PT Problem List). Pt will benefit from skilled PT to increase their independence and safety with mobility to allow discharge to the venue listed below. Pt is highly motivated and hopes to D/C tomorrow. Will need to practice steps prior to D/C with daughter in law (who is a CNA)     PT Assessment  Patient needs continued PT services    Follow Up Recommendations  Home health PT;Supervision/Assistance - 24 hour    Does the patient have the potential to tolerate intense rehabilitation      Barriers to Discharge   none    Equipment Recommendations  None recommended by PT    Recommendations for Other Services OT consult   Frequency 7X/week    Precautions / Restrictions Precautions Precautions: Fall;Knee Precaution Comments: pt given TKA HEP  Restrictions Weight Bearing Restrictions: Yes LLE Weight Bearing: Weight bearing as tolerated   Pertinent Vitals/Pain 2/10; repositioned in chair       Mobility  Bed Mobility Bed Mobility: Supine to Sit;Sitting - Scoot to Edge of Bed Supine to Sit: 5: Supervision;HOB elevated;With rails Sitting - Scoot to Edge of Bed: 6: Modified independent (Device/Increase time) Details for Bed Mobility Assistance: pt relied on HOB elevated and handrails; no physical (A) needed  Transfers Transfers: Sit to Stand;Stand to Sit Sit to Stand: 4: Min assist;From bed;With upper extremity assist;From elevated surface Stand to Sit: 4: Min guard;To chair/3-in-1;With armrests Details for Transfer Assistance: intial sit to stand attempt was unsuccessful; pt attmpeted to stand impulsively with  fully WB on Lt LE and Lt LE buckled; pt recovered with (A) to bed; cues for safety and hand placement on RW with min (A) to acheive power up to standing position and second attempt was successful  Ambulation/Gait Ambulation/Gait Assistance: 4: Min guard Ambulation Distance (Feet): 6 Feet (with some pivotal and lateral steps ) Assistive device: Rolling walker Ambulation/Gait Assistance Details: cues for gt sequencing and upright posture; pt with Lt LE buckling at times; c/o minor dizziniess, but reported it subsided quickly Gait Pattern: Step-to pattern;Decreased stance time - left;Decreased step length - right;Wide base of support;Trunk flexed Gait velocity: decreased Stairs: No Wheelchair Mobility Wheelchair Mobility: No    Exercises Total Joint Exercises Ankle Circles/Pumps: AROM;Both;10 reps;Seated Quad Sets: AROM;Left;10 reps;Seated Heel Slides: AROM;Left;5 reps;Seated;Other (comment);Strengthening (cues for proper muscle isolation ) Long Arc Quad: AAROM;Left;10 reps;Seated;Other (comment) (pt requires (A) to complete full ROM) Goniometric ROM: AROM to ~80 degrees   PT Diagnosis: Difficulty walking;Acute pain;Generalized weakness  PT Problem List: Decreased strength;Decreased range of motion;Decreased activity tolerance;Decreased balance;Decreased mobility;Decreased knowledge of use of DME;Pain;Obesity PT Treatment Interventions: DME instruction;Gait training;Stair training;Functional mobility training;Therapeutic activities;Therapeutic exercise;Balance training;Neuromuscular re-education;Patient/family education     PT Goals(Current goals can be found in the care plan section) Acute Rehab PT Goals Patient Stated Goal: to go home tomorrow PT Goal Formulation: With patient Time For Goal Achievement: 06/18/13 Potential to Achieve Goals: Good  Visit Information  Last PT Received On: 06/11/13 Assistance Needed: +1 History of Present Illness: s/p elective Lt TKA        Prior  Functioning  Home Living Family/patient expects to be discharged to::  Private residence Living Arrangements: Children Available Help at Discharge: Family;Available 24 hours/day Type of Home: House Home Access: Stairs to enter [employer] of Steps: 4 Entrance Stairs-Rails: Can reach both Home Layout: One level Home Equipment: Walker - 2 wheels;Bedside commode;Shower seat;Grab bars - toilet;Hand held shower head;Grab bars - tub/shower Additional Comments: pt had Rt TKA in June and within one week was independent with amb without AD Prior Function Level of Independence: Independent Comments: Pt takes care of her brother. Plans to D/C with Daughter in law for a few days then go back home. - home is handicap accessible  Communication Communication: No difficulties Dominant Hand: Right    Cognition  Cognition Arousal/Alertness: Awake/alert Behavior During Therapy: WFL for tasks assessed/performed Overall Cognitive Status: Within Functional Limits for tasks assessed    Extremity/Trunk Assessment Upper Extremity Assessment Upper Extremity Assessment: Defer to OT evaluation Lower Extremity Assessment Lower Extremity Assessment: LLE deficits/detail LLE Deficits / Details: decreased ability to perform LAQ LLE Sensation: decreased light touch ("numbness" decr to light touch ) Cervical / Trunk Assessment Cervical / Trunk Assessment: Kyphotic   Balance Balance Balance Assessed: Yes Static Sitting Balance Static Sitting - Balance Support: Bilateral upper extremity supported;Feet unsupported Static Sitting - Level of Assistance: 5: Stand by assistance Static Sitting - Comment/# of Minutes: tolerated sitting EOB ~5 min to prepare for transfer and donn new gown  End of Session PT - End of Session Equipment Utilized During Treatment: Gait belt Activity Tolerance: Patient tolerated treatment well Patient left: in chair;with call bell/phone within reach;with family/visitor  present Nurse Communication: Mobility status CPM Left Knee CPM Left Knee: Off  GP     [place], [place]. [place] [phone] 06/11/2013, 3:56 PM

## 2013-06-11 NOTE — Anesthesia Procedure Notes (Addendum)
Anesthesia Regional Block:  Femoral nerve block  Pre-Anesthetic Checklist: ,, timeout performed, Correct Patient, Correct Site, Correct Laterality, Correct Procedure, Correct Position, site marked, Risks and benefits discussed,  Surgical consent,  Pre-op evaluation,  At surgeon's request and post-op pain management  Laterality: Left  Prep: chloraprep       Needles:  Injection technique: Single-shot  Needle Type: Echogenic Stimulator Needle     Needle Length: 5cm 5 cm Needle Gauge: 22 and 22 G    Additional Needles:  Procedures: ultrasound guided (picture in chart) and nerve stimulator Femoral nerve block  Nerve Stimulator or Paresthesia:  Response: quadraceps contraction, 0.45 mA,   Additional Responses:   Narrative:  Start time: 06/11/2013 7:11 AM End time: 06/11/2013 7:21 AM Injection made incrementally with aspirations every 5 mL.  Performed by: Personally  Anesthesiologist: Halford Decamp, MD  Additional Notes: Functioning IV was confirmed and monitors were applied.  A 50mm 22ga Arrow echogenic stimulator needle was used. Sterile prep and drape,hand hygiene and sterile gloves were used. Ultrasound guidance: relevant anatomy identified, needle position confirmed, local anesthetic spread visualized around nerve(s)., vascular puncture avoided.  Image printed for medical record. Negative aspiration and negative test dose prior to incremental administration of local anesthetic. The patient tolerated the procedure well.    Femoral nerve block Procedure Name: Intubation Date/Time: 06/11/2013 7:39 AM Performed by: Sharlene Dory E Pre-anesthesia Checklist: Patient identified, Emergency Drugs available, Suction available, Patient being monitored and Timeout performed Patient Re-evaluated:Patient Re-evaluated prior to inductionOxygen Delivery Method: Circle system utilized Preoxygenation: Pre-oxygenation with 100% oxygen Intubation Type: IV induction Ventilation: Mask  ventilation without difficulty Laryngoscope Size: Mac and 3 Grade View: Grade I Tube type: Oral Tube size: 7.0 mm Number of attempts: 1 Airway Equipment and Method: Stylet Placement Confirmation: breath sounds checked- equal and bilateral,  ETT inserted through vocal cords under direct vision and positive ETCO2 Secured at: 20 cm Tube secured with: Tape Dental Injury: Teeth and Oropharynx as per pre-operative assessment

## 2013-06-11 NOTE — Progress Notes (Signed)
Orthopedic Tech Progress Note Patient Details:  [name] [facility] 1955/12/04 [id number] CPM applied to Left LE with appropriate settings. OHF applied to bed CPM Left Knee CPM Left Knee: On Left Knee Flexion (Degrees): 60 Left Knee Extension (Degrees): 0   [name] 06/11/2013, 11:54 AM

## 2013-06-11 NOTE — Plan of Care (Signed)
Problem: Consults Goal: Diagnosis- Total Joint Replacement Primary Total Knee Left     

## 2013-06-11 NOTE — Progress Notes (Signed)
Utilization Review Completed.[name] T10/20/2014  

## 2013-06-11 NOTE — Op Note (Signed)
PATIENT ID:      [name]  MRN:     [id number] DOB/AGE:    1955/10/01 / 57 y.o.       OPERATIVE REPORT    DATE OF PROCEDURE:  06/11/2013       PREOPERATIVE DIAGNOSIS:   OSTEOARTHRITIS LEFT KNEE      Estimated body mass index is 38.80 kg/(m^2) as calculated from the following:   Height as of 06/01/13: 5\' 6"  (1.676 m).   Weight as of 01/23/13: 108.999 kg (240 lb 4.8 oz).                                                        POSTOPERATIVE DIAGNOSIS:   osteoarthritis left knee                                                                      PROCEDURE:  Procedure(s): TOTAL KNEE ARTHROPLASTY Using Depuy Sigma RP implants #4L Femur, #4Tibia, 10mm sigma RP bearing, 38 Patella     SURGEON: Traevon Meiring J    ASSISTANT:   Eric K. [employer]   (Present and scrubbed throughout the case, critical for assistance with exposure, retraction, instrumentation, and closure.)         ANESTHESIA: GET Exparel  DRAINS: foley, 2 medium hemovac in knee   TOURNIQUET TIME:   COMPLICATIONS:  None     SPECIMENS: None   INDICATIONS FOR PROCEDURE: The patient has  OSTEOARTHRITIS LEFT KNEE, varus deformities, XR shows bone on bone arthritis. Patient has failed all conservative measures including anti-inflammatory medicines, narcotics, attempts at  exercise and weight loss, cortisone injections and viscosupplementation.  Risks and benefits of surgery have been discussed, questions answered.   DESCRIPTION OF PROCEDURE: The patient identified by armband, received  IV antibiotics, in the holding area at [facility]. Patient taken to the operating room, appropriate anesthetic  monitors were attached, and general endotracheal anesthesia induced with  the patient in supine position, Foley catheter was inserted. Tourniquet  applied high to the operative thigh. Lateral post and foot positioner  applied to the table, the lower extremity was then prepped and draped  in usual sterile fashion from the  ankle to the tourniquet. Time-out procedure was performed. The limb was wrapped with an Esmarch bandage and the tourniquet inflated to 350 mmHg. We began the operation by making the anterior midline incision starting at handbreadth above the patella going over the patella 1 cm medial to and  4 cm distal to the tibial tubercle. Small bleeders in the skin and the  subcutaneous tissue identified and cauterized. Transverse retinaculum was incised and reflected medially and a medial parapatellar arthrotomy was accomplished. the patella was everted and theprepatellar fat pad resected. The superficial medial collateral  ligament was then elevated from anterior to posterior along the proximal  flare of the tibia and anterior half of the menisci resected. The knee was hyperflexed exposing bone on bone arthritis. Peripheral and notch osteophytes as well as the cruciate ligaments were then resected. We continued to  work our way around posteriorly  along the proximal tibia, and externally  rotated the tibia subluxing it out from underneath the femur. A McHale  retractor was placed through the notch and a lateral Hohmann retractor  placed, and we then drilled through the proximal tibia in line with the  axis of the tibia followed by an intramedullary guide rod and 2-degree  posterior slope cutting guide. The tibial cutting guide was pinned into place  allowing resection of 4 mm of bone medially and about 8 mm of bone  laterally because of her varus deformity. Satisfied with the tibial resection, we then  entered the distal femur 2 mm anterior to the PCL origin with the  intramedullary guide rod and applied the distal femoral cutting guide  set at 11mm, with 5 degrees of valgus. This was pinned along the  epicondylar axis. At this point, the distal femoral cut was accomplished without difficulty. We then sized for a #4L femoral component and pinned the guide in 3 degrees of external rotation.The chamfer cutting  guide was pinned into place. The anterior, posterior, and chamfer cuts were accomplished without difficulty followed by  the Sigma RP box cutting guide and the box cut. We also removed posterior osteophytes from the posterior femoral condyles. At this  time, the knee was brought into full extension. We checked our  extension and flexion gaps and found them symmetric at 10mm.  The patella thickness measured at 25 mm. We set the cutting guide at 15 and removed the posterior 10 mm  of the patella, sized for a 38 button and drilled the lollipop. The knee  was then once again hyperflexed exposing the proximal tibia. We sized for a #4 tibial base plate, applied the smokestack and the conical reamer followed by the the Delta fin keel punch. We then hammered into place the Sigma RP trial femoral component, inserted a 10-mm trial bearing, trial patellar button, and took the knee through range of motion from 0-130 degrees. No thumb pressure was required for patellar  tracking. At this point, all trial components were removed, a double batch of DePuy HV cement with 1500 mg of Zinacef was mixed and applied to all bony metallic mating surfaces except for the posterior condyles of the femur itself. In order, we  hammered into place the tibial tray and removed excess cement, the femoral component and removed excess cement, a 10-mm Sigma RP bearing  was inserted, and the knee brought to full extension with compression.  The patellar button was clamped into place, and excess cement  removed. While the cement cured the wound was irrigated out with normal saline solution pulse lavage, and medium Hemovac drains were placed from an anterolateral  approach. Ligament stability and patellar tracking were checked and found to be excellent. The parapatellar arthrotomy was closed with  running #1 Vicryl suture. The subcutaneous tissue with 0 and 2-0 undyed  Vicryl suture, and the skin with skin staples. A dressing of Xeroform,   4 x 4, dressing sponges, Webril, and Ace wrap applied. The patient  awakened, extubated, and taken to recovery room without difficulty.   Clif Serio J 06/11/2013, 8:57 AM

## 2013-06-11 NOTE — Anesthesia Preprocedure Evaluation (Addendum)
Anesthesia Evaluation  Patient identified by MRN, date of birth, ID band Patient awake    Reviewed: Allergy & Precautions, H&P , NPO status , Patient's Chart, lab work & pertinent test results  Airway Mallampati: I      Dental  (+) Edentulous Upper and Edentulous Lower   Pulmonary Current Smoker,          Cardiovascular hypertension, Pt. on medications     Neuro/Psych    GI/Hepatic PUD,   Endo/Other    Renal/GU      Musculoskeletal   Abdominal   Peds  Hematology   Anesthesia Other Findings   Reproductive/Obstetrics                          Anesthesia Physical Anesthesia Plan  ASA: II  Anesthesia Plan: General   Post-op Pain Management:    Induction: Intravenous  Airway Management Planned: Oral ETT  Additional Equipment: Arterial line  Intra-op Plan:   Post-operative Plan: Extubation in OR  Informed Consent: I have reviewed the patients History and Physical, chart, labs and discussed the procedure including the risks, benefits and alternatives for the proposed anesthesia with the patient or authorized representative who has indicated his/her understanding and acceptance.   Dental advisory given  Plan Discussed with: CRNA and Anesthesiologist  Anesthesia Plan Comments:         Anesthesia Quick Evaluation

## 2013-06-11 NOTE — Interval H&P Note (Signed)
History and Physical Interval Note:  06/11/2013 7:21 AM  [name] Distance  has presented today for surgery, with the diagnosis of OSTEOARTHRITIS LEFT KNEE  The various methods of treatment have been discussed with the patient and family. After consideration of risks, benefits and other options for treatment, the patient has consented to  Procedure(s): TOTAL KNEE ARTHROPLASTY (Left) as a surgical intervention .  The patient's history has been reviewed, patient examined, no change in status, stable for surgery.  I have reviewed the patient's chart and labs.  Questions were answered to the patient's satisfaction.     [name]

## 2013-06-12 ENCOUNTER — Encounter (HOSPITAL_COMMUNITY): Payer: Self-pay | Admitting: Internal Medicine

## 2013-06-12 LAB — TYPE AND SCREEN
ABO/RH(D): A POS
Antibody Screen: POSITIVE
DAT, IgG: NEGATIVE
Donor AG Type: NEGATIVE
Unit division: 0
Unit division: 0

## 2013-06-12 LAB — CBC
HCT: 34.6 % — ABNORMAL LOW (ref 36.0–46.0)
Hemoglobin: 11.6 g/dL — ABNORMAL LOW (ref 12.0–15.0)
MCH: 29.8 pg (ref 26.0–34.0)
MCV: 88.9 fL (ref 78.0–100.0)
RBC: 3.89 MIL/uL (ref 3.87–5.11)

## 2013-06-12 MED ORDER — ASPIRIN EC 325 MG PO TBEC: 325.0000 mg | DELAYED_RELEASE_TABLET | Freq: Two times a day (BID) | ORAL | Status: DC

## 2013-06-12 MED ORDER — OXYCODONE HCL 5 MG PO TABS: 5.0000 mg | ORAL_TABLET | ORAL | Status: DC | PRN

## 2013-06-12 MED ORDER — OXYCODONE-ACETAMINOPHEN 5-325 MG PO TABS: 1.0000 | ORAL_TABLET | ORAL | Status: DC | PRN

## 2013-06-12 MED ORDER — METHOCARBAMOL 500 MG PO TABS: 500.0000 mg | ORAL_TABLET | Freq: Four times a day (QID) | ORAL | Status: DC

## 2013-06-12 NOTE — Progress Notes (Signed)
Patient ID: [name], female   DOB: [birth year], 57 y.o.   MRN: [id number] PATIENT ID: [name]  MRN: [id number]  DOB/AGE:  [birth year] / 57 y.o.  1 Day Post-Op Procedure(s) (LRB): TOTAL KNEE ARTHROPLASTY (Left)    PROGRESS NOTE Subjective: Patient is alert, oriented, no Nausea, no Vomiting, yes passing gas, no Bowel Movement. Taking PO well. Denies SOB, Chest or Calf Pain. Using Incentive Spirometer, PAS in place. Ambulate walked in room easily yesterday weightbearing as tolerated, CPM 0 100 Patient reports pain as 2 on 0-10 scale  .    Objective: Vital signs in last 24 hours: Filed Vitals:   06/11/13 1346 06/11/13 2018 06/12/13 0115 06/12/13 0430  BP: 158/92 164/82 143/72 139/80  Pulse: 86 81 78 80  Temp: 97.6 F (36.4 C) 98.6 F (37 C) 99.1 F (37.3 C) 99.3 F (37.4 C)  TempSrc:  Oral Oral Oral  Resp: 17 18 18 18   SpO2: 100% 100% 100% 100%      Intake/Output from previous day: I/O last 3 completed shifts: In: 2580.8 [P.O.:660; I.V.:1920.8] Out: 2820 [Urine:2745; Drains:50; Blood:25]   Intake/Output this shift: Total I/O In: 1620 [P.O.:120; I.V.:1500] Out: 3515 [Urine:3450; Drains:65]   LABORATORY DATA:  Recent Labs  06/12/13 0424  WBC 17.5*  HGB 11.6*  HCT 34.6*  PLT 288    Examination: Neurologically intact ABD soft Neurovascular intact Sensation intact distally Intact pulses distally Dorsiflexion/Plantar flexion intact Incision: no drainage No cellulitis present Compartment soft} Blood and plasma separated in drain indicating minimal recent drainage, drain pulled without difficulty.  Assessment:   1 Day Post-Op Procedure(s) (LRB): TOTAL KNEE ARTHROPLASTY (Left) ADDITIONAL DIAGNOSIS:  Hypertension  Plan: PT/OT WBAT, CPM 5/hrs day until ROM 0-90 degrees, then D/C CPM DVT Prophylaxis:  SCDx72hrs, ASA 325 mg BID x 2 weeks DISCHARGE PLAN: Home, should be able to go home this afternoon after passing all physical therapy goals DISCHARGE  NEEDS: HHPT, HHRN, CPM, Walker, 3-in-1 comode seat and IV Antibiotics     Macrae Wiegman J 06/12/2013, 6:58 AM

## 2013-06-12 NOTE — Progress Notes (Signed)
[name] to be D/C'd Home per MD order. Discussed with the patient and all questions fully answered.    Medication List    STOP taking these medications       HYDROcodone-acetaminophen 7.5-325 MG per tablet  Commonly known as:  NORCO      TAKE these medications       aspirin EC 325 MG tablet  Take 1 tablet (325 mg total) by mouth 2 (two) times daily.     fluticasone 50 MCG/ACT nasal spray  Commonly known as:  FLONASE  Place 1 spray into the nose 2 (two) times daily.     furosemide 20 MG tablet  Commonly known as:  LASIX  Take 20 mg by mouth daily.     gemfibrozil 600 MG tablet  Commonly known as:  LOPID  Take 600 mg by mouth 2 (two) times daily before a meal.     LINZESS 290 MCG Caps capsule  Generic drug:  Linaclotide  Take 290 mcg by mouth daily.     lisinopril 20 MG tablet  Commonly known as:  PRINIVIL,ZESTRIL  Take 20 mg by mouth daily.     lovastatin 20 MG tablet  Commonly known as:  MEVACOR  Take 20 mg by mouth at bedtime.     lubiprostone 24 MCG capsule  Commonly known as:  AMITIZA  Take 1 capsule (24 mcg total) by mouth 2 (two) times daily with a meal.     meloxicam 7.5 MG tablet  Commonly known as:  MOBIC  Take 7.5 mg by mouth daily.     methocarbamol 500 MG tablet  Commonly known as:  ROBAXIN  Take 1 tablet (500 mg total) by mouth 4 (four) times daily.     multivitamin with minerals Tabs tablet  Take 1 tablet by mouth 2 (two) times daily.     oxyCODONE-acetaminophen 5-325 MG per tablet  Commonly known as:  ROXICET  Take 1 tablet by mouth every 4 (four) hours as needed for pain.     pantoprazole 40 MG tablet  Commonly known as:  PROTONIX  Take 40 mg by mouth 2 (two) times daily.     polyethylene glycol-electrolytes 420 G solution  Commonly known as:  TRILYTE  Take 4,000 mLs by mouth as directed.     Potassium Gluconate 550 MG Tabs  Take 1-2 tablets by mouth daily as needed. Takes with fluid pill. May take extra tablet due to cramping  in legs.        VVS, Skin clean, dry and intact without evidence of skin break down, no evidence of skin tears noted.  IV catheter discontinued intact. Site without signs and symptoms of complications. Dressing and pressure applied.  An After Visit Summary was printed and given to the patient.  Patient escorted via WC, and D/C home via private auto.  [name]  06/12/2013 10:38 AM

## 2013-06-12 NOTE — Progress Notes (Signed)
06/12/13 Set up with [facility], PT and OT with [facility] by MD office.[employer] providing CPM, patient already had rolling walker and 3N1. Jacquelynn Cree RN, BSN, CCM

## 2013-06-12 NOTE — Progress Notes (Signed)
OT Cancellation Note  Patient Details Name: [name] MRN: [id number] DOB: May 29, 1956   Cancelled Treatment:    Reason Eval/Treat Not Completed: OT screened, no needs identified, will sign off  Spoke with pt and she had previous knee surgery recently. Verbalized she has no OT needs.   Earlie Raveling OTR/L [phone] 06/12/2013, 10:19 AM

## 2013-06-12 NOTE — Discharge Summary (Signed)
Patient ID: [name] MRN: [id number] DOB/AGE: 26-Sep-1955 57 y.o.  Admit date: 06/11/2013 Discharge date: 06/12/2013  Admission Diagnoses:  Active Problems:   Arthritis of left knee   Discharge Diagnoses:  Same  Past Medical History  Diagnosis Date  . Hypertension   . High cholesterol   . Fluid retention   . Hypokalemia   . Chronic back pain   . Arthritis   . Pneumonia     hospitalized in March  . Cancer 1992    cervical  . Diverticulosis   . Peptic ulcer     Surgeries: Procedure(s): TOTAL KNEE ARTHROPLASTY on 06/11/2013   Consultants:    Discharged Condition: Improved  Hospital Course: [name] is an 57 y.o. female who was admitted 06/11/2013 for operative treatment of<principal problem not specified>. Patient has severe unremitting pain that affects sleep, daily activities, and work/hobbies. After pre-op clearance the patient was taken to the operating room on 06/11/2013 and underwent  Procedure(s): TOTAL KNEE ARTHROPLASTY.    Patient was given perioperative antibiotics: Anti-infectives   Start     Dose/Rate Route Frequency Ordered Stop   06/11/13 0814  cefUROXime (ZINACEF) injection  Status:  Discontinued       As needed 06/11/13 0815 06/11/13 1022   06/11/13 0600  ceFAZolin (ANCEF) IVPB 2 g/50 mL premix     2 g 100 mL/hr over 30 Minutes Intravenous On call to O.R. 06/10/13 1603 06/11/13 0745       Patient was given sequential compression devices, early ambulation, and chemoprophylaxis to prevent DVT.  Patient benefited maximally from hospital stay and there were no complications.    Recent vital signs: Patient Vitals for the past 24 hrs:  BP Temp Temp src Pulse Resp SpO2  06/12/13 0430 139/80 mmHg 99.3 F (37.4 C) Oral 80 18 100 %  06/12/13 0115 143/72 mmHg 99.1 F (37.3 C) Oral 78 18 100 %  06/11/13 2018 164/82 mmHg 98.6 F (37 C) Oral 81 18 100 %  06/11/13 1346 158/92 mmHg 97.6 F (36.4 C) - 86 17 100 %  06/11/13 1117 145/92 mmHg  97.9 F (36.6 C) - 87 18 100 %  06/11/13 1035 139/82 mmHg - - 79 17 100 %  06/11/13 1030 - 97.9 F (36.6 C) - 88 19 100 %  06/11/13 1020 163/79 mmHg - - 85 15 99 %  06/11/13 1015 - - - 80 20 100 %  06/11/13 1014 159/90 mmHg - - 81 15 100 %  06/11/13 1010 168/104 mmHg - - 80 11 99 %  06/11/13 1005 160/133 mmHg - - 85 25 100 %  06/11/13 1000 - - - 85 11 100 %  06/11/13 0950 165/90 mmHg - - 85 12 100 %  06/11/13 0945 - - - 83 16 100 %  06/11/13 0935 175/66 mmHg - - 90 12 97 %  06/11/13 0934 - 97.5 F (36.4 C) - - - -     Recent laboratory studies:  Recent Labs  06/12/13 0424  WBC 17.5*  HGB 11.6*  HCT 34.6*  PLT 288     Discharge Medications:     Medication List    STOP taking these medications       HYDROcodone-acetaminophen 7.5-325 MG per tablet  Commonly known as:  NORCO      TAKE these medications       aspirin EC 325 MG tablet  Take 1 tablet (325 mg total) by mouth 2 (two) times daily.  fluticasone 50 MCG/ACT nasal spray  Commonly known as:  FLONASE  Place 1 spray into the nose 2 (two) times daily.     furosemide 20 MG tablet  Commonly known as:  LASIX  Take 20 mg by mouth daily.     gemfibrozil 600 MG tablet  Commonly known as:  LOPID  Take 600 mg by mouth 2 (two) times daily before a meal.     LINZESS 290 MCG Caps capsule  Generic drug:  Linaclotide  Take 290 mcg by mouth daily.     lisinopril 20 MG tablet  Commonly known as:  PRINIVIL,ZESTRIL  Take 20 mg by mouth daily.     lovastatin 20 MG tablet  Commonly known as:  MEVACOR  Take 20 mg by mouth at bedtime.     lubiprostone 24 MCG capsule  Commonly known as:  AMITIZA  Take 1 capsule (24 mcg total) by mouth 2 (two) times daily with a meal.     meloxicam 7.5 MG tablet  Commonly known as:  MOBIC  Take 7.5 mg by mouth daily.     methocarbamol 500 MG tablet  Commonly known as:  ROBAXIN  Take 1 tablet (500 mg total) by mouth 4 (four) times daily.     multivitamin with minerals Tabs  tablet  Take 1 tablet by mouth 2 (two) times daily.     oxyCODONE-acetaminophen 5-325 MG per tablet  Commonly known as:  ROXICET  Take 1 tablet by mouth every 4 (four) hours as needed for pain.     pantoprazole 40 MG tablet  Commonly known as:  PROTONIX  Take 40 mg by mouth 2 (two) times daily.     polyethylene glycol-electrolytes 420 G solution  Commonly known as:  TRILYTE  Take 4,000 mLs by mouth as directed.     Potassium Gluconate 550 MG Tabs  Take 1-2 tablets by mouth daily as needed. Takes with fluid pill. May take extra tablet due to cramping in legs.        Diagnostic Studies: No results found.  Disposition: 01-Home or Self Care      Discharge Orders   Future Orders Complete By Expires   Call MD / Call 911  As directed    Comments:     If you experience chest pain or shortness of breath, CALL 911 and be transported to the hospital emergency room.  If you develope a fever above 101 F, pus (white drainage) or increased drainage or redness at the wound, or calf pain, call your surgeon's office.   Change dressing  As directed    Comments:     Change dressing on POD #5.  You may clean the incision with alcohol prior to redressing.   Constipation Prevention  As directed    Comments:     Drink plenty of fluids.  Prune juice may be helpful.  You may use a stool softener, such as Colace (over the counter) 100 mg twice a day.  Use MiraLax (over the counter) for constipation as needed.   CPM  As directed    Comments:     Continuous passive motion machine (CPM):      Use the CPM from 0 to 60 for 5 hours per day.      You may increase by 10 degrees per day.  You may break it up into 2 or 3 sessions per day.      Use CPM for 2 weeks or until you are told to stop.  Diet - low sodium heart healthy  As directed    Do not put a pillow under the knee. Place it under the heel.  As directed    Driving restrictions  As directed    Comments:     No driving for 2 weeks   Increase  activity slowly as tolerated  As directed    Patient may shower  As directed    Comments:     You may shower without a dressing once there is no drainage.  Do not wash over the wound.  If drainage remains, cover wound with plastic wrap and then shower.      Follow-up Information   Follow up with [name], MD In 2 weeks.   Specialty:  Orthopedic Surgery   Contact information:   [address] [place] [address] [phone]        Signed: [name] 06/12/2013, 7:03 AM

## 2013-06-12 NOTE — Progress Notes (Signed)
Physical Therapy Treatment Patient Details Name: [name] MRN: [id number] DOB: 1956-02-27 Today's Date: 06/12/2013 Time: 1000-1026 PT Time Calculation (min): 26 min  PT Assessment / Plan / Recommendation  History of Present Illness s/p elective Lt TKA    PT Comments   Pt moving well despite nerve block still affecting L knee.  Pt eager for D/C to home.    Follow Up Recommendations  Home health PT;Supervision/Assistance - 24 hour     Does the patient have the potential to tolerate intense rehabilitation     Barriers to Discharge        Equipment Recommendations  None recommended by PT    Recommendations for Other Services    Frequency 7X/week   Progress towards PT Goals Progress towards PT goals: Progressing toward goals  Plan Current plan remains appropriate    Precautions / Restrictions Precautions Precautions: Fall;Knee Precaution Booklet Issued: Yes (comment) Restrictions Weight Bearing Restrictions: Yes LLE Weight Bearing: Weight bearing as tolerated   Pertinent Vitals/Pain "Not much since it's still pretty numb."    Mobility  Bed Mobility Bed Mobility: Not assessed Transfers Transfers: Sit to Stand;Stand to Sit Sit to Stand: 5: Supervision;With upper extremity assist;From bed Stand to Sit: 5: Supervision;With upper extremity assist;To bed Details for Transfer Assistance: pt demos good technique and better control of L knee.  pt's knee still affected by nerve block, however pt more aware and compensating for it.   Ambulation/Gait Ambulation/Gait Assistance: 5: Supervision Ambulation Distance (Feet): 120 Feet Assistive device: Rolling walker Ambulation/Gait Assistance Details: Demos good use of RW.  pt with good use of UEs to support L LE during mobility.  L LE able to bear some weight per pt, but will buckle if pt increases WBing.   Gait Pattern: Step-to pattern;Decreased stance time - left;Decreased step length - right;Wide base of support;Trunk  flexed Stairs: No Wheelchair Mobility Wheelchair Mobility: No    Exercises Total Joint Exercises Ankle Circles/Pumps: AROM;Both;10 reps;Seated Quad Sets: AROM;Left;10 reps;Seated Long Arc Quad: AAROM;Left;10 reps Knee Flexion: AAROM;Left;10 reps   PT Diagnosis:    PT Problem List:   PT Treatment Interventions:     PT Goals (current goals can now be found in the care plan section) Acute Rehab PT Goals Patient Stated Goal: to go home tomorrow Time For Goal Achievement: 06/18/13 Potential to Achieve Goals: Good  Visit Information  Last PT Received On: 06/12/13 Assistance Needed: +1 History of Present Illness: s/p elective Lt TKA     Subjective Data  Patient Stated Goal: to go home tomorrow   Cognition  Cognition Arousal/Alertness: Awake/alert Behavior During Therapy: WFL for tasks assessed/performed Overall Cognitive Status: Within Functional Limits for tasks assessed    Balance  Balance Balance Assessed: No  End of Session PT - End of Session Equipment Utilized During Treatment: Gait belt Activity Tolerance: Patient tolerated treatment well Patient left: in bed;with call bell/phone within reach (Sitting EOB.  ) Nurse Communication: Mobility status CPM Left Knee CPM Left Knee: Off   GP     Sunny Schlein, [place] [phone] 06/12/2013, 11:05 AM

## 2013-06-17 ENCOUNTER — Encounter: Payer: Self-pay | Admitting: Internal Medicine

## 2013-06-18 ENCOUNTER — Telehealth: Payer: Self-pay

## 2013-06-18 NOTE — Telephone Encounter (Signed)
CC PCP 

## 2013-06-18 NOTE — Telephone Encounter (Signed)
Letter mailed to pt.  

## 2013-06-18 NOTE — Telephone Encounter (Signed)
Letter from: Corbin Ade   Send letter to patient.  Send copy of letter with path to referring provider and PCP.   Need ov w extender in about 3 months to consider repeat egd to verify ulcer healing

## 2013-10-02 ENCOUNTER — Other Ambulatory Visit (HOSPITAL_COMMUNITY): Payer: Self-pay | Admitting: Family Medicine

## 2013-10-02 DIAGNOSIS — Z139 Encounter for screening, unspecified: Secondary | ICD-10-CM

## 2013-10-04 ENCOUNTER — Emergency Department (HOSPITAL_COMMUNITY): Payer: BC Managed Care – PPO

## 2013-10-04 ENCOUNTER — Other Ambulatory Visit: Payer: Self-pay | Admitting: Orthopedic Surgery

## 2013-10-04 ENCOUNTER — Inpatient Hospital Stay (HOSPITAL_COMMUNITY)
Admission: EM | Admit: 2013-10-04 | Discharge: 2013-10-07 | DRG: 482 | Disposition: A | Discharge status: Home Health | Payer: BC Managed Care – PPO | Attending: Internal Medicine | Admitting: Internal Medicine

## 2013-10-04 ENCOUNTER — Encounter (HOSPITAL_COMMUNITY): Payer: Self-pay | Admitting: Emergency Medicine

## 2013-10-04 DIAGNOSIS — K279 Peptic ulcer, site unspecified, unspecified as acute or chronic, without hemorrhage or perforation: Secondary | ICD-10-CM

## 2013-10-04 DIAGNOSIS — M549 Dorsalgia, unspecified: Secondary | ICD-10-CM

## 2013-10-04 DIAGNOSIS — Z9089 Acquired absence of other organs: Secondary | ICD-10-CM

## 2013-10-04 DIAGNOSIS — M545 Low back pain, unspecified: Secondary | ICD-10-CM | POA: Diagnosis present

## 2013-10-04 DIAGNOSIS — Z79899 Other long term (current) drug therapy: Secondary | ICD-10-CM

## 2013-10-04 DIAGNOSIS — K5730 Diverticulosis of large intestine without perforation or abscess without bleeding: Secondary | ICD-10-CM | POA: Diagnosis present

## 2013-10-04 DIAGNOSIS — S72143A Displaced intertrochanteric fracture of unspecified femur, initial encounter for closed fracture: Principal | ICD-10-CM

## 2013-10-04 DIAGNOSIS — Y92009 Unspecified place in unspecified non-institutional (private) residence as the place of occurrence of the external cause: Secondary | ICD-10-CM

## 2013-10-04 DIAGNOSIS — S72009A Fracture of unspecified part of neck of unspecified femur, initial encounter for closed fracture: Secondary | ICD-10-CM | POA: Diagnosis present

## 2013-10-04 DIAGNOSIS — G8929 Other chronic pain: Secondary | ICD-10-CM

## 2013-10-04 DIAGNOSIS — Z96659 Presence of unspecified artificial knee joint: Secondary | ICD-10-CM

## 2013-10-04 DIAGNOSIS — Z888 Allergy status to other drugs, medicaments and biological substances status: Secondary | ICD-10-CM

## 2013-10-04 DIAGNOSIS — Z886 Allergy status to analgesic agent status: Secondary | ICD-10-CM

## 2013-10-04 DIAGNOSIS — W010XXA Fall on same level from slipping, tripping and stumbling without subsequent striking against object, initial encounter: Secondary | ICD-10-CM | POA: Diagnosis present

## 2013-10-04 DIAGNOSIS — E785 Hyperlipidemia, unspecified: Secondary | ICD-10-CM | POA: Diagnosis present

## 2013-10-04 DIAGNOSIS — F17200 Nicotine dependence, unspecified, uncomplicated: Secondary | ICD-10-CM | POA: Diagnosis present

## 2013-10-04 DIAGNOSIS — Y9301 Activity, walking, marching and hiking: Secondary | ICD-10-CM

## 2013-10-04 DIAGNOSIS — Z7982 Long term (current) use of aspirin: Secondary | ICD-10-CM

## 2013-10-04 DIAGNOSIS — Z881 Allergy status to other antibiotic agents status: Secondary | ICD-10-CM

## 2013-10-04 DIAGNOSIS — S72142A Displaced intertrochanteric fracture of left femur, initial encounter for closed fracture: Secondary | ICD-10-CM

## 2013-10-04 DIAGNOSIS — I10 Essential (primary) hypertension: Secondary | ICD-10-CM

## 2013-10-04 DIAGNOSIS — R6881 Early satiety: Secondary | ICD-10-CM

## 2013-10-04 DIAGNOSIS — M1711 Unilateral primary osteoarthritis, right knee: Secondary | ICD-10-CM

## 2013-10-04 DIAGNOSIS — E7800 Pure hypercholesterolemia, unspecified: Secondary | ICD-10-CM

## 2013-10-04 DIAGNOSIS — R1032 Left lower quadrant pain: Secondary | ICD-10-CM

## 2013-10-04 DIAGNOSIS — R634 Abnormal weight loss: Secondary | ICD-10-CM

## 2013-10-04 DIAGNOSIS — M1712 Unilateral primary osteoarthritis, left knee: Secondary | ICD-10-CM

## 2013-10-04 DIAGNOSIS — K5909 Other constipation: Secondary | ICD-10-CM

## 2013-10-04 DIAGNOSIS — S72002A Fracture of unspecified part of neck of left femur, initial encounter for closed fracture: Secondary | ICD-10-CM | POA: Diagnosis present

## 2013-10-04 LAB — URINALYSIS, ROUTINE W REFLEX MICROSCOPIC
Bilirubin Urine: NEGATIVE
Glucose, UA: NEGATIVE mg/dL
Hgb urine dipstick: NEGATIVE
KETONES UR: NEGATIVE mg/dL
LEUKOCYTES UA: NEGATIVE
Nitrite: NEGATIVE
PH: 6.5 (ref 5.0–8.0)
PROTEIN: NEGATIVE mg/dL
Specific Gravity, Urine: 1.01 (ref 1.005–1.030)
UROBILINOGEN UA: 0.2 mg/dL (ref 0.0–1.0)

## 2013-10-04 LAB — CBC WITH DIFFERENTIAL/PLATELET
Basophils Absolute: 0 10*3/uL (ref 0.0–0.1)
Basophils Relative: 0 % (ref 0–1)
Eosinophils Absolute: 0.3 10*3/uL (ref 0.0–0.7)
Eosinophils Relative: 3 % (ref 0–5)
HEMATOCRIT: 43.4 % (ref 36.0–46.0)
Hemoglobin: 14.3 g/dL (ref 12.0–15.0)
Lymphocytes Relative: 17 % (ref 12–46)
Lymphs Abs: 2.2 10*3/uL (ref 0.7–4.0)
MCH: 30.2 pg (ref 26.0–34.0)
MCHC: 32.9 g/dL (ref 30.0–36.0)
MCV: 91.6 fL (ref 78.0–100.0)
Monocytes Absolute: 0.6 10*3/uL (ref 0.1–1.0)
Monocytes Relative: 4 % (ref 3–12)
NEUTROS ABS: 9.9 10*3/uL — AB (ref 1.7–7.7)
Neutrophils Relative %: 76 % (ref 43–77)
Platelets: 240 10*3/uL (ref 150–400)
RBC: 4.74 MIL/uL (ref 3.87–5.11)
RDW: 13 % (ref 11.5–15.5)
WBC: 13 10*3/uL — ABNORMAL HIGH (ref 4.0–10.5)

## 2013-10-04 LAB — BASIC METABOLIC PANEL
BUN: 17 mg/dL (ref 6–23)
CALCIUM: 10.1 mg/dL (ref 8.4–10.5)
CHLORIDE: 101 meq/L (ref 96–112)
CO2: 28 mEq/L (ref 19–32)
Creatinine, Ser: 0.9 mg/dL (ref 0.50–1.10)
GFR calc Af Amer: 80 mL/min — ABNORMAL LOW (ref >90)
GFR calc non Af Amer: 69 mL/min — ABNORMAL LOW (ref >90)
Glucose, Bld: 100 mg/dL — ABNORMAL HIGH (ref 70–99)
Potassium: 4 mEq/L (ref 3.7–5.3)
Sodium: 144 mEq/L (ref 137–147)

## 2013-10-04 LAB — PROTIME-INR
INR: 0.92 (ref 0.00–1.49)
Prothrombin Time: 12.2 seconds (ref 11.6–15.2)

## 2013-10-04 LAB — APTT: aPTT: 28 seconds (ref 24–37)

## 2013-10-04 MED ORDER — HYDROCODONE-ACETAMINOPHEN 7.5-325 MG PO TABS
1.0000 | ORAL_TABLET | Freq: Two times a day (BID) | ORAL | Status: DC | PRN
  Administered 2013-10-04: 1 via ORAL
  Filled 2013-10-04: qty 1

## 2013-10-04 MED ORDER — SODIUM CHLORIDE 0.9 % IV SOLN: INTRAVENOUS | Status: DC

## 2013-10-04 MED ORDER — LINACLOTIDE 290 MCG PO CAPS
290.0000 ug | ORAL_CAPSULE | Freq: Every day | ORAL | Status: DC
  Administered 2013-10-06 – 2013-10-07 (×2): 290 ug via ORAL
  Filled 2013-10-04 (×4): qty 1

## 2013-10-04 MED ORDER — MELOXICAM 15 MG PO TABS
15.0000 mg | ORAL_TABLET | Freq: Every day | ORAL | Status: DC
  Administered 2013-10-06 – 2013-10-07 (×2): 15 mg via ORAL
  Filled 2013-10-04 (×4): qty 1

## 2013-10-04 MED ORDER — FUROSEMIDE 20 MG PO TABS
20.0000 mg | ORAL_TABLET | Freq: Every day | ORAL | Status: DC
  Administered 2013-10-04 – 2013-10-07 (×3): 20 mg via ORAL
  Filled 2013-10-04 (×4): qty 1

## 2013-10-04 MED ORDER — METHOCARBAMOL 500 MG PO TABS
500.0000 mg | ORAL_TABLET | Freq: Four times a day (QID) | ORAL | Status: DC
  Administered 2013-10-04 – 2013-10-07 (×11): 500 mg via ORAL
  Filled 2013-10-04 (×17): qty 1

## 2013-10-04 MED ORDER — GEMFIBROZIL 600 MG PO TABS
600.0000 mg | ORAL_TABLET | Freq: Two times a day (BID) | ORAL | Status: DC
  Administered 2013-10-05 – 2013-10-07 (×4): 600 mg via ORAL
  Filled 2013-10-04 (×7): qty 1

## 2013-10-04 MED ORDER — HYDROCODONE-ACETAMINOPHEN 5-325 MG PO TABS
1.0000 | ORAL_TABLET | Freq: Four times a day (QID) | ORAL | Status: DC | PRN
  Administered 2013-10-04: 2 via ORAL
  Administered 2013-10-05 (×3): 1 via ORAL
  Filled 2013-10-04 (×2): qty 1
  Filled 2013-10-04: qty 2
  Filled 2013-10-04: qty 1

## 2013-10-04 MED ORDER — LISINOPRIL 20 MG PO TABS
20.0000 mg | ORAL_TABLET | Freq: Every day | ORAL | Status: DC
  Administered 2013-10-06 – 2013-10-07 (×2): 20 mg via ORAL
  Filled 2013-10-04 (×4): qty 1

## 2013-10-04 MED ORDER — HYDROMORPHONE HCL PF 1 MG/ML IJ SOLN
1.0000 mg | INTRAMUSCULAR | Status: DC | PRN
  Administered 2013-10-04 (×2): 1 mg via INTRAVENOUS
  Filled 2013-10-04 (×2): qty 1

## 2013-10-04 MED ORDER — MORPHINE SULFATE 2 MG/ML IJ SOLN
0.5000 mg | INTRAMUSCULAR | Status: DC | PRN
  Administered 2013-10-04 – 2013-10-05 (×6): 0.5 mg via INTRAVENOUS
  Filled 2013-10-04 (×6): qty 1

## 2013-10-04 MED ORDER — LUBIPROSTONE 24 MCG PO CAPS
24.0000 ug | ORAL_CAPSULE | Freq: Two times a day (BID) | ORAL | Status: DC
  Administered 2013-10-05 – 2013-10-07 (×4): 24 ug via ORAL
  Filled 2013-10-04 (×7): qty 1

## 2013-10-04 MED ORDER — SODIUM CHLORIDE 0.9 % IV SOLN
INTRAVENOUS | Status: DC
  Administered 2013-10-04: 16:00:00 via INTRAVENOUS

## 2013-10-04 MED ORDER — FENTANYL CITRATE 0.05 MG/ML IJ SOLN
50.0000 ug | INTRAMUSCULAR | Status: AC | PRN
  Administered 2013-10-04 (×2): 50 ug via INTRAVENOUS
  Filled 2013-10-04 (×2): qty 2

## 2013-10-04 MED ORDER — PANTOPRAZOLE SODIUM 40 MG PO TBEC
40.0000 mg | DELAYED_RELEASE_TABLET | Freq: Two times a day (BID) | ORAL | Status: DC
  Administered 2013-10-04 – 2013-10-07 (×5): 40 mg via ORAL
  Filled 2013-10-04 (×5): qty 1

## 2013-10-04 MED ORDER — ONDANSETRON HCL 4 MG/2ML IJ SOLN: 4.0000 mg | Freq: Three times a day (TID) | INTRAMUSCULAR | Status: DC | PRN

## 2013-10-04 MED ORDER — HYDROMORPHONE HCL PF 1 MG/ML IJ SOLN: 1.0000 mg | INTRAMUSCULAR | Status: DC | PRN

## 2013-10-04 MED ORDER — CHLORHEXIDINE GLUCONATE 4 % EX LIQD
Freq: Once | CUTANEOUS | Status: AC
  Administered 2013-10-05: 12:00:00 via TOPICAL
  Filled 2013-10-04: qty 15

## 2013-10-04 NOTE — H&P (Signed)
PCP:   Leonides Grills, MD   Chief Complaint:  fall  HPI: 58 yo female h/o PUD, htn, hld comes in after having a mechanical fall resulting in left hip fracture.  Pain is well controlled right now.  Her rubber boot got stuck on a nail sticking out of her ramp walkway, which caused her to trip and fall.  Overall pretty healthy, was diagnosed recently with PUD per egd in nov 2014, was placed on a ppi and this issue has much improved, no h/o bleeding from ulcer however.  No prior cardiac disease, is very active.  Review of Systems:  Positive and negative as per HPI otherwise all other systems are negative  Past Medical History: Past Medical History  Diagnosis Date  . Hypertension   . High cholesterol   . Fluid retention   . Hypokalemia   . Chronic back pain   . Arthritis   . Pneumonia     hospitalized in March  . Cancer 1992    cervical  . Diverticulosis   . Peptic ulcer    Past Surgical History  Procedure Laterality Date  . Abdominal hysterectomy    . Cholecystectomy    . Knee surgery      right and left  . Total knee arthroplasty Right 01/29/2013    Procedure: TOTAL KNEE ARTHROPLASTY WITH HARDWARE REMOVAL;  Surgeon: [name], MD;  Location: [place];  Service: Orthopedics;  Laterality: Right;  DEPUY/SIGMA RP, SYNTHES SCREWDRIVERS  . Total knee arthroplasty Left 06/11/2013    Dr Mayer Camel  . Colonoscopy with esophagogastroduodenoscopy (egd) N/A 06/08/2013    Procedure: COLONOSCOPY WITH ESOPHAGOGASTRODUODENOSCOPY (EGD);  Surgeon: Daneil Dolin, MD;  [facility];  Service: Endoscopy;  Laterality: N/A;  7:30-moved to 8:00am Dr. Request Time  . Total knee arthroplasty Left 06/11/2013    Procedure: TOTAL KNEE ARTHROPLASTY;  Surgeon: [name], MD;  Location: [place];  Service: Orthopedics;  Laterality: Left;    Medications: Prior to Admission medications   Medication Sig Start Date End Date Taking? Authorizing Provider  aspirin EC 81 MG tablet Take 81 mg by mouth  daily.   Yes Historical Provider, MD  fluticasone (FLONASE) 50 MCG/ACT nasal spray Place 1 spray into the nose 2 (two) times daily.   Yes Historical Provider, MD  furosemide (LASIX) 20 MG tablet Take 20 mg by mouth daily.   Yes Historical Provider, MD  gemfibrozil (LOPID) 600 MG tablet Take 600 mg by mouth 2 (two) times daily before a meal.   Yes Historical Provider, MD  HYDROcodone-acetaminophen (NORCO) 7.5-325 MG per tablet Take 1 tablet by mouth 2 (two) times daily as needed for moderate pain.   Yes Historical Provider, MD  Linaclotide (LINZESS) 290 MCG CAPS capsule Take 290 mcg by mouth daily.   Yes Historical Provider, MD  lisinopril (PRINIVIL,ZESTRIL) 20 MG tablet Take 20 mg by mouth daily.   Yes Historical Provider, MD  lovastatin (MEVACOR) 20 MG tablet Take 40 mg by mouth at bedtime.    Yes Historical Provider, MD  lubiprostone (AMITIZA) 24 MCG capsule Take 1 capsule (24 mcg total) by mouth 2 (two) times daily with a meal. 05/21/13  Yes Mahala Menghini, PA-C  meloxicam (MOBIC) 7.5 MG tablet Take 15 mg by mouth daily.    Yes Historical Provider, MD  methocarbamol (ROBAXIN) 500 MG tablet Take 1 tablet (500 mg total) by mouth 4 (four) times daily. 06/12/13  Yes [name], MD  pantoprazole (PROTONIX) 40 MG tablet  Take 40 mg by mouth 2 (two) times daily.   Yes Historical Provider, MD  Potassium Gluconate 550 MG TABS Take 1 tablet by mouth daily. Takes with fluid pill. May take extra tablet due to cramping in legs.   Yes Historical Provider, MD    Allergies:   Allergies  Allergen Reactions  . Clindamycin/Lincomycin Swelling    Swelling feet  . Codeine Itching  . Levaquin [Levofloxacin] Swelling  . Nsaids     Needs to avoid d/t peptic ulcer    Social History:  reports that she has been smoking Cigarettes.  She has a 21.5 pack-year smoking history. She has never used smokeless tobacco. She reports that she drinks alcohol. She reports that she does not use illicit drugs.  Family  History: Family History  Problem Relation Age of Onset  . Ulcers Father   . Colon cancer Neg Hx   . Kidney cancer Neg Hx     Physical Exam: Filed Vitals:   10/04/13 1337 10/04/13 1800  BP: 193/97 156/77  Pulse: 80 87  Temp: 98 F (36.7 C)   TempSrc: Oral   Resp: 20   SpO2: 100% 92%   General appearance: alert, cooperative and no distress Head: Normocephalic, without obvious abnormality, atraumatic Eyes: negative Nose: Nares normal. Septum midline. Mucosa normal. No drainage or sinus tenderness. Neck: no JVD and supple, symmetrical, trachea midline Lungs: clear to auscultation bilaterally Heart: regular rate and rhythm, S1, S2 normal, no murmur, click, rub or gallop Abdomen: soft, non-tender; bowel sounds normal; no masses,  no organomegaly Extremities: extremities normal, atraumatic, no cyanosis or edema Pulses: 2+ and symmetric Skin: Skin color, texture, turgor normal. No rashes or lesions Neurologic: Grossly normal    Labs on Admission:   Recent Labs  10/04/13 1547  NA 144  K 4.0  CL 101  CO2 28  GLUCOSE 100*  BUN 17  CREATININE 0.90  CALCIUM 10.1    Recent Labs  10/04/13 1547  WBC 13.0*  NEUTROABS 9.9*  HGB 14.3  HCT 43.4  MCV 91.6  PLT 240   Radiological Exams on Admission: Dg Chest 1 View  10/04/2013   CLINICAL DATA:  Low back pain.  Left hip pain.  Status post fall.  EXAM: CHEST - 1 VIEW  COMPARISON:  PA lateral chest 11/22/2012.  FINDINGS: Heart size is upper normal. Lungs are clear. No pneumothorax or pleural effusion.  IMPRESSION: No acute disease.   Electronically Signed   By: Inge Rise M.D.   On: 10/04/2013 15:33   Dg Lumbar Spine Complete  10/04/2013   CLINICAL DATA:  Left hip pain.  Low back pain.  Fall.  Hypertension.  EXAM: LUMBAR SPINE - COMPLETE 4+ VIEW  COMPARISON:  CT ABD - PELV W/ CM dated 03/22/2013  FINDINGS: Lumbar spurring observed with facet arthropathy particularly at L4-5 and L5-S1, and to a lesser extent at L3-4  bilaterally.  Aortoiliac atherosclerosis noted.  Body habitus reduces diagnostic sensitivity and specificity. There is 3 mm of degenerative grade 1 anterolisthesis at L4-5, similar to the prior CT scan. No fracture identified.  IMPRESSION: 1. Lower lumbar spondylosis without fracture or acute subluxation. There is 3 mm of degenerative grade 1 anterolisthesis at L4-5, stable from prior exam. 2.  Body habitus reduces diagnostic sensitivity and specificity.   Electronically Signed   By: Sherryl Barters M.D.   On: 10/04/2013 15:33   Dg Hip Complete Left  10/04/2013   CLINICAL DATA:  Status post fall.  Left hip pain.  EXAM: LEFT HIP - COMPLETE 2+ VIEW  COMPARISON:  None.  FINDINGS: The patient has an acute left intertrochanteric fracture. No other acute bony or joint abnormality is identified. No notable degenerative change.  IMPRESSION: Acute left intertrochanteric fracture.   Electronically Signed   By: Inge Rise M.D.   On: 10/04/2013 15:29    Assessment/Plan  58 yo female with mechanical fall and left femur fracture  Principal Problem:   Intertrochanteric fracture of left femur- hip fracture pathway.  Npo after midnight for surgery tomorrow.  Ortho aware of patient, will notify them again of pt arrival to [facility].  Transferred here from [facility] as her orthopedic surgeon is here.  Pain management, well controlled at this time.  Ck preop 12 lead ekg.    Active Problems:   Hypertension  stabe   Chronic back pain  stable   Peptic ulcer  Continue her PPI   High cholesterol  Stable  Full code.  Admit to ortho floor.   Lev Cervone A 10/04/2013, 7:38 PM

## 2013-10-04 NOTE — ED Notes (Signed)
Assisted pt on and off bed pan

## 2013-10-04 NOTE — ED Notes (Signed)
Report given to Larene Beach, [employer]. ETA 20 minutes

## 2013-10-04 NOTE — ED Notes (Signed)
Fall today with left hip pain.

## 2013-10-04 NOTE — ED Notes (Signed)
[facility] at bedside 

## 2013-10-04 NOTE — ED Provider Notes (Signed)
CSN: [id number]     Arrival date & time 10/04/13  1333 History   First MD Initiated Contact with Patient 10/04/13 1337     Chief Complaint  Patient presents with  . Hip Pain     HPI Pt was seen at 1340.  Per EMS and pt report, c/o sudden onset and persistence of constant left hip "pain" that began PTA. Pt states she was slipped and fell while walking at home, landed directly onto her left hip. States she was unable to stand or walk on her LLE after the fall. Left hip pain increases with palpation of the area and body position changes. Pt denies prodromal symptoms before falling, denies head injury, no LOC, no AMS, no neck pain, no CP/SOB, no abd pain, no N/V/D, no focal motor weakness, no tingling/numbness in extremities.     Ortho Dr. Mayer Camel Past Medical History  Diagnosis Date  . Hypertension   . High cholesterol   . Fluid retention   . Hypokalemia   . Chronic back pain   . Arthritis   . Pneumonia     hospitalized in March  . Cancer 1992    cervical  . Diverticulosis   . Peptic ulcer    Past Surgical History  Procedure Laterality Date  . Abdominal hysterectomy    . Cholecystectomy    . Knee surgery      right and left  . Total knee arthroplasty Right 01/29/2013    Procedure: TOTAL KNEE ARTHROPLASTY WITH HARDWARE REMOVAL;  Surgeon: [name], MD;  Location: [place];  Service: Orthopedics;  Laterality: Right;  DEPUY/SIGMA RP, SYNTHES SCREWDRIVERS  . Total knee arthroplasty Left 06/11/2013    Dr Mayer Camel  . Colonoscopy with esophagogastroduodenoscopy (egd) N/A 06/08/2013    Procedure: COLONOSCOPY WITH ESOPHAGOGASTRODUODENOSCOPY (EGD);  Surgeon: Daneil Dolin, MD;  [facility]: [facility];  Service: Endoscopy;  Laterality: N/A;  7:30-moved to 8:00am Dr. Request Time  . Total knee arthroplasty Left 06/11/2013    Procedure: TOTAL KNEE ARTHROPLASTY;  Surgeon: [name], MD;  Location: [place];  Service: Orthopedics;  Laterality: Left;   Family History  Problem Relation Age of  Onset  . Ulcers Father   . Colon cancer Neg Hx   . Kidney cancer Neg Hx    History  Substance Use Topics  . Smoking status: Current Every Day Smoker -- 0.50 packs/day for 43 years    Types: Cigarettes  . Smokeless tobacco: Never Used  . Alcohol Use: Yes     Comment: occasional    Review of Systems ROS: Statement: All systems negative except as marked or noted in the HPI; Constitutional: Negative for fever and chills. ; ; Eyes: Negative for eye pain, redness and discharge. ; ; ENMT: Negative for ear pain, hoarseness, nasal congestion, sinus pressure and sore throat. ; ; Cardiovascular: Negative for chest pain, palpitations, diaphoresis, dyspnea and peripheral edema. ; ; Respiratory: Negative for cough, wheezing and stridor. ; ; Gastrointestinal: Negative for nausea, vomiting, diarrhea, abdominal pain, blood in stool, hematemesis, jaundice and rectal bleeding. . ; ; Genitourinary: Negative for dysuria, flank pain and hematuria. ; ; Musculoskeletal: Negative for back pain and neck pain. +left hip pain.; ; Skin: Negative for pruritus, rash, abrasions, blisters, bruising and skin lesion.; ; Neuro: Negative for headache, lightheadedness and neck stiffness. Negative for weakness, altered level of consciousness , altered mental status, extremity weakness, paresthesias, involuntary movement, seizure and syncope.        Allergies  Clindamycin/lincomycin; Codeine;  Levaquin; and Nsaids  Home Medications   Current Outpatient Rx  Name  Route  Sig  Dispense  Refill  . aspirin EC 81 MG tablet   Oral   Take 81 mg by mouth daily.         . fluticasone (FLONASE) 50 MCG/ACT nasal spray   Nasal   Place 1 spray into the nose 2 (two) times daily.         . furosemide (LASIX) 20 MG tablet   Oral   Take 20 mg by mouth daily.         Marland Kitchen gemfibrozil (LOPID) 600 MG tablet   Oral   Take 600 mg by mouth 2 (two) times daily before a meal.         . HYDROcodone-acetaminophen (NORCO) 7.5-325 MG per  tablet   Oral   Take 1 tablet by mouth 2 (two) times daily as needed for moderate pain.         . Linaclotide (LINZESS) 290 MCG CAPS capsule   Oral   Take 290 mcg by mouth daily.         Marland Kitchen lisinopril (PRINIVIL,ZESTRIL) 20 MG tablet   Oral   Take 20 mg by mouth daily.         Marland Kitchen lovastatin (MEVACOR) 20 MG tablet   Oral   Take 40 mg by mouth at bedtime.          Marland Kitchen lubiprostone (AMITIZA) 24 MCG capsule   Oral   Take 1 capsule (24 mcg total) by mouth 2 (two) times daily with a meal.   60 capsule   5   . meloxicam (MOBIC) 7.5 MG tablet   Oral   Take 15 mg by mouth daily.          . methocarbamol (ROBAXIN) 500 MG tablet   Oral   Take 1 tablet (500 mg total) by mouth 4 (four) times daily.   60 tablet   0   . pantoprazole (PROTONIX) 40 MG tablet   Oral   Take 40 mg by mouth 2 (two) times daily.         . Potassium Gluconate 550 MG TABS   Oral   Take 1 tablet by mouth daily. Takes with fluid pill. May take extra tablet due to cramping in legs.          BP 193/97  Pulse 80  Temp(Src) 98 F (36.7 C) (Oral)  Resp 20  SpO2 100% Physical Exam 1345: Physical examination:  Nursing notes reviewed; Vital signs and O2 SAT reviewed;  Constitutional: Well developed, Well nourished, Well hydrated, Uncomfortable appearing.;; Head:  Normocephalic, atraumatic; Eyes: EOMI, PERRL, No scleral icterus; ENMT: Mouth and pharynx normal, Mucous membranes moist; Neck: Supple, Full range of motion, No lymphadenopathy; Cardiovascular: Regular rate and rhythm, No gallop; Respiratory: Breath sounds clear & equal bilaterally, No rales, rhonchi, wheezes.  Speaking full sentences with ease, Normal respiratory effort/excursion; Chest: Nontender, Movement normal; Abdomen: Soft, Nontender, Nondistended, Normal bowel sounds; Genitourinary: No CVA tenderness; Spine:  No midline CS, TS, LS tenderness. +TTP left lumbar paraspinal muscles;; Extremities: Pulses normal, +left hip tenderness to palp. LLE  externally rotated. NMS intact left foot. Pelvis stable. No edema, No calf edema or asymmetry.; Neuro: AA&Ox3, Major CN grossly intact.  Speech clear. No gross focal motor or sensory deficits in extremities.; Skin: Color normal, Warm, Dry.   ED Course  Procedures     EKG Interpretation   None       MDM  MDM Reviewed: previous chart, nursing note and vitals Reviewed previous: labs Interpretation: labs and x-ray   Results for orders placed during the hospital encounter of 10/04/13  CBC WITH DIFFERENTIAL      Result Value Ref Range   WBC 13.0 (*) 4.0 - 10.5 K/uL   RBC 4.74  3.87 - 5.11 MIL/uL   Hemoglobin 14.3  12.0 - 15.0 g/dL   HCT 43.4  36.0 - 46.0 %   MCV 91.6  78.0 - 100.0 fL   MCH 30.2  26.0 - 34.0 pg   MCHC 32.9  30.0 - 36.0 g/dL   RDW 13.0  11.5 - 15.5 %   Platelets 240  150 - 400 K/uL   Neutrophils Relative % 76  43 - 77 %   Neutro Abs 9.9 (*) 1.7 - 7.7 K/uL   Lymphocytes Relative 17  12 - 46 %   Lymphs Abs 2.2  0.7 - 4.0 K/uL   Monocytes Relative 4  3 - 12 %   Monocytes Absolute 0.6  0.1 - 1.0 K/uL   Eosinophils Relative 3  0 - 5 %   Eosinophils Absolute 0.3  0.0 - 0.7 K/uL   Basophils Relative 0  0 - 1 %   Basophils Absolute 0.0  0.0 - 0.1 K/uL  BASIC METABOLIC PANEL      Result Value Ref Range   Sodium 144  137 - 147 mEq/L   Potassium 4.0  3.7 - 5.3 mEq/L   Chloride 101  96 - 112 mEq/L   CO2 28  19 - 32 mEq/L   Glucose, Bld 100 (*) 70 - 99 mg/dL   BUN 17  6 - 23 mg/dL   Creatinine, Ser 0.90  0.50 - 1.10 mg/dL   Calcium 10.1  8.4 - 10.5 mg/dL   GFR calc non Af Amer 69 (*) >90 mL/min   GFR calc Af Amer 80 (*) >90 mL/min   Dg Chest 1 View 10/04/2013   CLINICAL DATA:  Low back pain.  Left hip pain.  Status post fall.  EXAM: CHEST - 1 VIEW  COMPARISON:  PA lateral chest 11/22/2012.  FINDINGS: Heart size is upper normal. Lungs are clear. No pneumothorax or pleural effusion.  IMPRESSION: No acute disease.   Electronically Signed   By: Inge Rise M.D.    On: 10/04/2013 15:33   Dg Lumbar Spine Complete 10/04/2013   CLINICAL DATA:  Left hip pain.  Low back pain.  Fall.  Hypertension.  EXAM: LUMBAR SPINE - COMPLETE 4+ VIEW  COMPARISON:  CT ABD - PELV W/ CM dated 03/22/2013  FINDINGS: Lumbar spurring observed with facet arthropathy particularly at L4-5 and L5-S1, and to a lesser extent at L3-4 bilaterally.  Aortoiliac atherosclerosis noted.  Body habitus reduces diagnostic sensitivity and specificity. There is 3 mm of degenerative grade 1 anterolisthesis at L4-5, similar to the prior CT scan. No fracture identified.  IMPRESSION: 1. Lower lumbar spondylosis without fracture or acute subluxation. There is 3 mm of degenerative grade 1 anterolisthesis at L4-5, stable from prior exam. 2.  Body habitus reduces diagnostic sensitivity and specificity.   Electronically Signed   By: Sherryl Barters M.D.   On: 10/04/2013 15:33   Dg Hip Complete Left 10/04/2013   CLINICAL DATA:  Status post fall.  Left hip pain.  EXAM: LEFT HIP - COMPLETE 2+ VIEW  COMPARISON:  None.  FINDINGS: The patient has an acute left intertrochanteric fracture. No other acute bony or joint abnormality is identified. No  notable degenerative change.  IMPRESSION: Acute left intertrochanteric fracture.   Electronically Signed   By: Inge Rise M.D.   On: 10/04/2013 15:29    1550:  Dx and testing d/w pt and family.  Questions answered.  Verb understanding, agreeable to admit.  T/C to Ortho Dr. Mayer Camel, case discussed, including:  HPI, pertinent PM/SHx, VS/PE, dx testing, ED course and treatment:  Agreeable to consult, requests to have [facility] admit at [facility]; he has to check schedule, please have [facility] call him when [facility] gets [facility].  T/C to [facility] Dr. Jerilee Hoh, case discussed, including:  HPI, pertinent PM/SHx, VS/PE, dx testing, ED course and treatment:  Agreeable to accept transfer/admit, requests to write temporary orders, obtain medical bed to team 10.      Alfonzo Feller, DO 10/06/13 1222

## 2013-10-04 NOTE — Progress Notes (Signed)
Transfer from [facility]  58 y/o with acute Left hip Fracture after a mechanical fall. Her ortho is Dr. Mayer Camel and will need to be notified on patient's arrival. He is already aware that she will be coming. Med-Surg, team 10.  Domingo Mend, MD [facility] Pager: [phone]

## 2013-10-04 NOTE — ED Notes (Signed)
Patient left ED at this time with [facility]. No distress upon departure.

## 2013-10-04 NOTE — ED Notes (Signed)
EMS gave 2 mg Morphine at 1317, 4 mg Morphine at 1322 IV.

## 2013-10-05 ENCOUNTER — Inpatient Hospital Stay: Admit: 2013-10-05 | Payer: Self-pay | Admitting: Orthopedic Surgery

## 2013-10-05 ENCOUNTER — Encounter (HOSPITAL_COMMUNITY): Payer: Self-pay | Admitting: Anesthesiology

## 2013-10-05 ENCOUNTER — Encounter (HOSPITAL_COMMUNITY)
Admission: EM | Disposition: A | Discharge status: Home Health | Payer: BC Managed Care – PPO | Source: Home / Self Care | Attending: Internal Medicine

## 2013-10-05 ENCOUNTER — Inpatient Hospital Stay (HOSPITAL_COMMUNITY): Payer: BC Managed Care – PPO

## 2013-10-05 ENCOUNTER — Encounter (HOSPITAL_COMMUNITY): Payer: BC Managed Care – PPO | Admitting: Anesthesiology

## 2013-10-05 ENCOUNTER — Inpatient Hospital Stay (HOSPITAL_COMMUNITY): Payer: BC Managed Care – PPO | Admitting: Anesthesiology

## 2013-10-05 DIAGNOSIS — IMO0002 Reserved for concepts with insufficient information to code with codable children: Secondary | ICD-10-CM

## 2013-10-05 DIAGNOSIS — R634 Abnormal weight loss: Secondary | ICD-10-CM

## 2013-10-05 DIAGNOSIS — M1710 Unilateral primary osteoarthritis, unspecified knee: Secondary | ICD-10-CM

## 2013-10-05 DIAGNOSIS — S72002A Fracture of unspecified part of neck of left femur, initial encounter for closed fracture: Secondary | ICD-10-CM | POA: Diagnosis present

## 2013-10-05 HISTORY — PX: INTRAMEDULLARY (IM) NAIL INTERTROCHANTERIC: SHX5875

## 2013-10-05 LAB — BASIC METABOLIC PANEL
BUN: 19 mg/dL (ref 6–23)
CO2: 26 mEq/L (ref 19–32)
Calcium: 9.3 mg/dL (ref 8.4–10.5)
Chloride: 102 mEq/L (ref 96–112)
Creatinine, Ser: 1.06 mg/dL (ref 0.50–1.10)
GFR, EST AFRICAN AMERICAN: 65 mL/min — AB (ref >90)
GFR, EST NON AFRICAN AMERICAN: 56 mL/min — AB (ref >90)
GLUCOSE: 98 mg/dL (ref 70–99)
POTASSIUM: 3.9 meq/L (ref 3.7–5.3)
SODIUM: 140 meq/L (ref 137–147)

## 2013-10-05 LAB — CBC
HCT: 37.7 % (ref 36.0–46.0)
HEMOGLOBIN: 12.4 g/dL (ref 12.0–15.0)
MCH: 30 pg (ref 26.0–34.0)
MCHC: 32.9 g/dL (ref 30.0–36.0)
MCV: 91.3 fL (ref 78.0–100.0)
Platelets: 223 10*3/uL (ref 150–400)
RBC: 4.13 MIL/uL (ref 3.87–5.11)
RDW: 13.5 % (ref 11.5–15.5)
WBC: 10.1 10*3/uL (ref 4.0–10.5)

## 2013-10-05 LAB — SURGICAL PCR SCREEN
MRSA, PCR: NEGATIVE
STAPHYLOCOCCUS AUREUS: NEGATIVE

## 2013-10-05 SURGERY — FIXATION, FRACTURE, INTERTROCHANTERIC, WITH INTRAMEDULLARY ROD
Anesthesia: General | Site: Hip | Laterality: Left

## 2013-10-05 MED ORDER — METOCLOPRAMIDE HCL 5 MG/ML IJ SOLN: 5.0000 mg | Freq: Three times a day (TID) | INTRAMUSCULAR | Status: DC | PRN

## 2013-10-05 MED ORDER — PROPOFOL 10 MG/ML IV BOLUS
INTRAVENOUS | Status: DC | PRN
  Administered 2013-10-05: 30 mg via INTRAVENOUS
  Administered 2013-10-05: 50 mg via INTRAVENOUS
  Administered 2013-10-05: 200 mg via INTRAVENOUS

## 2013-10-05 MED ORDER — HYDROCODONE-ACETAMINOPHEN 5-325 MG PO TABS
1.0000 | ORAL_TABLET | Freq: Four times a day (QID) | ORAL | Status: DC | PRN
  Administered 2013-10-05 – 2013-10-07 (×8): 2 via ORAL
  Filled 2013-10-05 (×8): qty 2

## 2013-10-05 MED ORDER — FENTANYL CITRATE 0.05 MG/ML IJ SOLN
INTRAMUSCULAR | Status: AC
  Filled 2013-10-05: qty 5

## 2013-10-05 MED ORDER — ROCURONIUM BROMIDE 50 MG/5ML IV SOLN
INTRAVENOUS | Status: AC
  Filled 2013-10-05: qty 1

## 2013-10-05 MED ORDER — ONDANSETRON HCL 4 MG PO TABS: 4.0000 mg | ORAL_TABLET | Freq: Four times a day (QID) | ORAL | Status: DC | PRN

## 2013-10-05 MED ORDER — BUPIVACAINE-EPINEPHRINE 0.5% -1:200000 IJ SOLN
INTRAMUSCULAR | Status: DC | PRN
  Administered 2013-10-05: 10 mL

## 2013-10-05 MED ORDER — HYDROMORPHONE HCL PF 1 MG/ML IJ SOLN
0.2500 mg | INTRAMUSCULAR | Status: DC | PRN
  Administered 2013-10-05 (×3): 0.5 mg via INTRAVENOUS

## 2013-10-05 MED ORDER — ASPIRIN EC 325 MG PO TBEC
325.0000 mg | DELAYED_RELEASE_TABLET | Freq: Every day | ORAL | Status: DC
  Administered 2013-10-06 – 2013-10-07 (×2): 325 mg via ORAL
  Filled 2013-10-05 (×3): qty 1

## 2013-10-05 MED ORDER — PROMETHAZINE HCL 25 MG/ML IJ SOLN
6.2500 mg | INTRAMUSCULAR | Status: DC | PRN
  Administered 2013-10-05: 6.25 mg via INTRAVENOUS

## 2013-10-05 MED ORDER — KCL IN DEXTROSE-NACL 20-5-0.45 MEQ/L-%-% IV SOLN
INTRAVENOUS | Status: DC
  Administered 2013-10-05: 18:00:00 via INTRAVENOUS
  Filled 2013-10-05 (×7): qty 1000

## 2013-10-05 MED ORDER — SODIUM CHLORIDE 0.9 % IV SOLN: INTRAVENOUS | Status: DC

## 2013-10-05 MED ORDER — GLYCOPYRROLATE 0.2 MG/ML IJ SOLN
INTRAMUSCULAR | Status: DC | PRN
  Administered 2013-10-05: 0.4 mg via INTRAVENOUS

## 2013-10-05 MED ORDER — MIDAZOLAM HCL 5 MG/5ML IJ SOLN
INTRAMUSCULAR | Status: DC | PRN
  Administered 2013-10-05: 2 mg via INTRAVENOUS

## 2013-10-05 MED ORDER — ONDANSETRON HCL 4 MG/2ML IJ SOLN
INTRAMUSCULAR | Status: DC | PRN
  Administered 2013-10-05: 4 mg via INTRAVENOUS

## 2013-10-05 MED ORDER — FENTANYL CITRATE 0.05 MG/ML IJ SOLN
INTRAMUSCULAR | Status: DC | PRN
  Administered 2013-10-05 (×3): 50 ug via INTRAVENOUS
  Administered 2013-10-05: 100 ug via INTRAVENOUS
  Administered 2013-10-05 (×2): 50 ug via INTRAVENOUS

## 2013-10-05 MED ORDER — PROMETHAZINE HCL 25 MG/ML IJ SOLN
INTRAMUSCULAR | Status: AC
  Administered 2013-10-05: 6.25 mg via INTRAVENOUS
  Filled 2013-10-05: qty 1

## 2013-10-05 MED ORDER — HYDROMORPHONE HCL PF 1 MG/ML IJ SOLN
INTRAMUSCULAR | Status: AC
  Administered 2013-10-05: 0.5 mg via INTRAVENOUS
  Filled 2013-10-05: qty 1

## 2013-10-05 MED ORDER — MIDAZOLAM HCL 2 MG/2ML IJ SOLN
INTRAMUSCULAR | Status: AC
  Filled 2013-10-05: qty 2

## 2013-10-05 MED ORDER — MENTHOL 3 MG MT LOZG: 1.0000 | LOZENGE | OROMUCOSAL | Status: DC | PRN

## 2013-10-05 MED ORDER — PHENOL 1.4 % MT LIQD: 1.0000 | OROMUCOSAL | Status: DC | PRN

## 2013-10-05 MED ORDER — ACETAMINOPHEN 650 MG RE SUPP: 650.0000 mg | Freq: Four times a day (QID) | RECTAL | Status: DC | PRN

## 2013-10-05 MED ORDER — BUPIVACAINE-EPINEPHRINE (PF) 0.5% -1:200000 IJ SOLN
INTRAMUSCULAR | Status: AC
  Filled 2013-10-05: qty 10

## 2013-10-05 MED ORDER — LACTATED RINGERS IV SOLN
INTRAVENOUS | Status: DC
  Administered 2013-10-05: 14:00:00 via INTRAVENOUS

## 2013-10-05 MED ORDER — NEOSTIGMINE METHYLSULFATE 1 MG/ML IJ SOLN
INTRAMUSCULAR | Status: DC | PRN
  Administered 2013-10-05: 3 mg via INTRAVENOUS

## 2013-10-05 MED ORDER — CEFAZOLIN SODIUM-DEXTROSE 2-3 GM-% IV SOLR
2.0000 g | INTRAVENOUS | Status: AC
  Administered 2013-10-05: 2 g via INTRAVENOUS
  Filled 2013-10-05: qty 50

## 2013-10-05 MED ORDER — ONDANSETRON HCL 4 MG/2ML IJ SOLN: 4.0000 mg | Freq: Four times a day (QID) | INTRAMUSCULAR | Status: DC | PRN

## 2013-10-05 MED ORDER — PROPOFOL 10 MG/ML IV BOLUS
INTRAVENOUS | Status: AC
  Filled 2013-10-05: qty 20

## 2013-10-05 MED ORDER — HYDRALAZINE HCL 20 MG/ML IJ SOLN
10.0000 mg | Freq: Four times a day (QID) | INTRAMUSCULAR | Status: DC | PRN
  Administered 2013-10-05: 10 mg via INTRAVENOUS
  Filled 2013-10-05: qty 1

## 2013-10-05 MED ORDER — ROCURONIUM BROMIDE 100 MG/10ML IV SOLN
INTRAVENOUS | Status: DC | PRN
  Administered 2013-10-05: 40 mg via INTRAVENOUS

## 2013-10-05 MED ORDER — MORPHINE SULFATE 2 MG/ML IJ SOLN
0.5000 mg | INTRAMUSCULAR | Status: DC | PRN
  Administered 2013-10-05 – 2013-10-06 (×6): 0.5 mg via INTRAVENOUS
  Filled 2013-10-05 (×6): qty 1

## 2013-10-05 MED ORDER — METOCLOPRAMIDE HCL 10 MG PO TABS: 5.0000 mg | ORAL_TABLET | Freq: Three times a day (TID) | ORAL | Status: DC | PRN

## 2013-10-05 MED ORDER — CHLORHEXIDINE GLUCONATE 4 % EX LIQD
60.0000 mL | Freq: Once | CUTANEOUS | Status: DC
  Filled 2013-10-05: qty 60

## 2013-10-05 MED ORDER — LIDOCAINE HCL (CARDIAC) 20 MG/ML IV SOLN
INTRAVENOUS | Status: AC
  Filled 2013-10-05: qty 5

## 2013-10-05 MED ORDER — ACETAMINOPHEN 325 MG PO TABS
650.0000 mg | ORAL_TABLET | Freq: Four times a day (QID) | ORAL | Status: DC | PRN
  Administered 2013-10-05 – 2013-10-06 (×2): 650 mg via ORAL
  Filled 2013-10-05 (×2): qty 2

## 2013-10-05 MED ORDER — 0.9 % SODIUM CHLORIDE (POUR BTL) OPTIME
TOPICAL | Status: DC | PRN
  Administered 2013-10-05: 1000 mL

## 2013-10-05 MED ORDER — LACTATED RINGERS IV SOLN
INTRAVENOUS | Status: DC | PRN
  Administered 2013-10-05 (×2): via INTRAVENOUS

## 2013-10-05 MED ORDER — LIDOCAINE HCL (CARDIAC) 20 MG/ML IV SOLN
INTRAVENOUS | Status: DC | PRN
  Administered 2013-10-05: 100 mg via INTRAVENOUS

## 2013-10-05 SURGICAL SUPPLY — 55 items
BANDAGE ELASTIC 4 VELCRO ST LF (GAUZE/BANDAGES/DRESSINGS) ×2 IMPLANT
BIT DRILL TWIST 3.8X7 (BIT) ×1 IMPLANT
CLOTH BEACON ORANGE TIMEOUT ST (SAFETY) ×1 IMPLANT
COVER SURGICAL LIGHT HANDLE (MISCELLANEOUS) ×2 IMPLANT
DRAPE C-ARM 42X72 X-RAY (DRAPES) ×2 IMPLANT
DRAPE INCISE IOBAN 66X45 STRL (DRAPES) ×2 IMPLANT
DRAPE ORTHO SPLIT 77X108 STRL (DRAPES) ×4
DRAPE PROXIMA HALF (DRAPES) ×4 IMPLANT
DRAPE STERI IOBAN 125X83 (DRAPES) ×1 IMPLANT
DRAPE SURG ORHT 6 SPLT 77X108 (DRAPES) ×2 IMPLANT
DRAPE U-SHAPE 47X51 STRL (DRAPES) ×2 IMPLANT
DRSG EMULSION OIL 3X3 NADH (GAUZE/BANDAGES/DRESSINGS) ×2 IMPLANT
DRSG MEPILEX BORDER 4X12 (GAUZE/BANDAGES/DRESSINGS) ×1 IMPLANT
DRSG MEPILEX BORDER 4X4 (GAUZE/BANDAGES/DRESSINGS) ×2 IMPLANT
DRSG MEPILEX BORDER 4X8 (GAUZE/BANDAGES/DRESSINGS) ×2 IMPLANT
DURAPREP 26ML APPLICATOR (WOUND CARE) ×2 IMPLANT
ELECT REM PT RETURN 9FT ADLT (ELECTROSURGICAL) ×2
ELECTRODE REM PT RTRN 9FT ADLT (ELECTROSURGICAL) ×1 IMPLANT
FACESHIELD LNG OPTICON STERILE (SAFETY) ×4 IMPLANT
GAUZE XEROFORM 5X9 LF (GAUZE/BANDAGES/DRESSINGS) ×2 IMPLANT
GLOVE BIO SURGEON STRL SZ7.5 (GLOVE) ×2 IMPLANT
GLOVE BIO SURGEON STRL SZ8.5 (GLOVE) ×2 IMPLANT
GLOVE BIOGEL PI IND STRL 8 (GLOVE) ×1 IMPLANT
GLOVE BIOGEL PI IND STRL 9 (GLOVE) ×1 IMPLANT
GLOVE BIOGEL PI INDICATOR 8 (GLOVE) ×1
GLOVE BIOGEL PI INDICATOR 9 (GLOVE) ×1
GOWN PREVENTION PLUS XLARGE (GOWN DISPOSABLE) ×2 IMPLANT
GOWN STRL NON-REIN LRG LVL3 (GOWN DISPOSABLE) ×6 IMPLANT
GOWN STRL REIN XL XLG (GOWN DISPOSABLE) ×2 IMPLANT
KIT BASIN OR (CUSTOM PROCEDURE TRAY) ×2 IMPLANT
KIT ROOM TURNOVER OR (KITS) ×2 IMPLANT
MANIFOLD NEPTUNE II (INSTRUMENTS) ×2 IMPLANT
NDL HYPO 25GX1X1/2 BEV (NEEDLE) IMPLANT
NEEDLE HYPO 25GX1X1/2 BEV (NEEDLE) ×2 IMPLANT
NS IRRIG 1000ML POUR BTL (IV SOLUTION) ×2 IMPLANT
PACK GENERAL/GYN (CUSTOM PROCEDURE TRAY) ×2 IMPLANT
PAD ARMBOARD 7.5X6 YLW CONV (MISCELLANEOUS) ×4 IMPLANT
PIN THREADED GUIDE ACE (PIN) ×1 IMPLANT
PLATE COMP 140D 4H STD (Plate) ×1 IMPLANT
SCREW ACECAP 38MM (Screw) ×1 IMPLANT
SCREW ACECAP 40MM (Screw) ×1 IMPLANT
SCREW ACECAP 42MM (Screw) ×1 IMPLANT
SCREW ACECAP 48MM (Screw) ×1 IMPLANT
SCREW LAG 95MM (Screw) ×2 IMPLANT
SCREW LAG REG 95X13XLRG KY (Screw) IMPLANT
STAPLER VISISTAT 35W (STAPLE) IMPLANT
SUT ETHILON 3 0 PS 1 (SUTURE) ×1 IMPLANT
SUT VIC AB 0 CT1 27 (SUTURE) ×2
SUT VIC AB 0 CT1 27XBRD ANBCTR (SUTURE) ×1 IMPLANT
SUT VIC AB 1 CTX 36 (SUTURE) ×2
SUT VIC AB 1 CTX36XBRD ANBCTR (SUTURE) IMPLANT
SUT VIC AB 2-0 CT1 27 (SUTURE) ×4
SUT VIC AB 2-0 CT1 TAPERPNT 27 (SUTURE) ×2 IMPLANT
SYR CONTROL 10ML LL (SYRINGE) ×1 IMPLANT
WATER STERILE IRR 1000ML POUR (IV SOLUTION) ×4 IMPLANT

## 2013-10-05 NOTE — Transfer of Care (Addendum)
Immediate Anesthesia Transfer of Care Note  Patient: [name]  Procedure(s) Performed: Procedure(s): ORIF LEFT HIP  INTERTROCHANTRIC FRACTURE (Left)  Patient Location: PACU  Anesthesia Type:General  Level of Consciousness: awake, alert , oriented and patient cooperative  Airway & Oxygen Therapy: Patient Spontanous Breathing and Patient connected to face mask oxygen  Post-op Assessment: Report given to PACU RN and Post -op Vital signs reviewed and stable  Post vital signs: Reviewed  Complications: No apparent anesthesia complications

## 2013-10-05 NOTE — Progress Notes (Signed)
[facility] PROGRESS NOTE  Alexis BLANKENBAKER [id number] DOB: 01/06/1956 DOA: 10/04/2013 PCP: Leonides Grills, MD  Assessment/Plan: Acute Left Hip Fracture -For repair today. -Management as per ortho. -Patient hopes to be able to return home with [facility] services.  HTN -Continue home medications. -Fair control.  PUD -Continue protonix.    Code Status: Full Code Family Communication: Patient only  Disposition Plan: Home when ready, likely 48 hours.   Consultants:  Ortho   Antibiotics:  None   Subjective: No complaints other than mild left hip pain,  Objective: Filed Vitals:   10/04/13 2300 10/05/13 0638 10/05/13 1141 10/05/13 1226  BP: 152/76 154/80  170/85  Pulse: 82 85  71  Temp: 98.2 F (36.8 C) 98.8 F (37.1 C)  98.3 F (36.8 C)  TempSrc:    Oral  Resp: 20 20 18 18   SpO2: 95% 98%  98%    Intake/Output Summary (Last 24 hours) at 10/05/13 1416 Last data filed at 10/05/13 1100  Gross per 24 hour  Intake   1020 ml  Output   3000 ml  Net  -1980 ml   There were no vitals filed for this visit.  Exam:   General:  AA Ox3  Cardiovascular: RRR  Respiratory: CTA B  Abdomen: S/NT/ND/+BS  Extremities: trace bilateral edema   Neurologic:  Non-focal  Data Reviewed: Basic Metabolic Panel:  Recent Labs Lab 10/04/13 1547 10/05/13 0710  NA 144 140  K 4.0 3.9  CL 101 102  CO2 28 26  GLUCOSE 100* 98  BUN 17 19  CREATININE 0.90 1.06  CALCIUM 10.1 9.3   Liver Function Tests: No results found for this basename: AST, ALT, ALKPHOS, BILITOT, PROT, ALBUMIN,  in the last 168 hours No results found for this basename: LIPASE, AMYLASE,  in the last 168 hours No results found for this basename: AMMONIA,  in the last 168 hours CBC:  Recent Labs Lab 10/04/13 1547 10/05/13 0710  WBC 13.0* 10.1  NEUTROABS 9.9*  --   HGB 14.3 12.4  HCT 43.4 37.7  MCV 91.6 91.3  PLT 240 223   Cardiac Enzymes: No results found for this basename: CKTOTAL, CKMB,  CKMBINDEX, TROPONINI,  in the last 168 hours BNP (last 3 results) No results found for this basename: PROBNP,  in the last 8760 hours CBG: No results found for this basename: GLUCAP,  in the last 168 hours  Recent Results (from the past 240 hour(s))  SURGICAL PCR SCREEN     Status: None   Collection Time    10/05/13  2:05 AM      Result Value Ref Range Status   MRSA, PCR NEGATIVE  NEGATIVE Final   Staphylococcus aureus NEGATIVE  NEGATIVE Final   Comment:            The Xpert SA Assay (FDA     approved for NASAL specimens     in patients over 61 years of age),     is one component of     a comprehensive surveillance     program.  Test performance has     been validated by [employer] for patients greater     than or equal to 71 year old.     It is not intended     to diagnose infection nor to     guide or monitor treatment.     Studies: Dg Chest 1 View  10/04/2013   CLINICAL DATA:  Low back pain.  Left hip pain.  Status post fall.  EXAM: CHEST - 1 VIEW  COMPARISON:  PA lateral chest 11/22/2012.  FINDINGS: Heart size is upper normal. Lungs are clear. No pneumothorax or pleural effusion.  IMPRESSION: No acute disease.   Electronically Signed   By: Inge Rise M.D.   On: 10/04/2013 15:33   Dg Lumbar Spine Complete  10/04/2013   CLINICAL DATA:  Left hip pain.  Low back pain.  Fall.  Hypertension.  EXAM: LUMBAR SPINE - COMPLETE 4+ VIEW  COMPARISON:  CT ABD - PELV W/ CM dated 03/22/2013  FINDINGS: Lumbar spurring observed with facet arthropathy particularly at L4-5 and L5-S1, and to a lesser extent at L3-4 bilaterally.  Aortoiliac atherosclerosis noted.  Body habitus reduces diagnostic sensitivity and specificity. There is 3 mm of degenerative grade 1 anterolisthesis at L4-5, similar to the prior CT scan. No fracture identified.  IMPRESSION: 1. Lower lumbar spondylosis without fracture or acute subluxation. There is 3 mm of degenerative grade 1 anterolisthesis at L4-5, stable from  prior exam. 2.  Body habitus reduces diagnostic sensitivity and specificity.   Electronically Signed   By: Sherryl Barters M.D.   On: 10/04/2013 15:33   Dg Hip Complete Left  10/04/2013   CLINICAL DATA:  Status post fall.  Left hip pain.  EXAM: LEFT HIP - COMPLETE 2+ VIEW  COMPARISON:  None.  FINDINGS: The patient has an acute left intertrochanteric fracture. No other acute bony or joint abnormality is identified. No notable degenerative change.  IMPRESSION: Acute left intertrochanteric fracture.   Electronically Signed   By: Inge Rise M.D.   On: 10/04/2013 15:29    Scheduled Meds: .  ceFAZolin (ANCEF) IV  2 g Intravenous On Call to OR  . chlorhexidine  60 mL Topical Once  . [facility] HOLD] furosemide  20 mg Oral Daily  . [MAR HOLD] gemfibrozil  600 mg Oral BID AC  . [facility] HOLD] Linaclotide  290 mcg Oral Daily  . [MAR HOLD] lisinopril  20 mg Oral Daily  . [facility] HOLD] lubiprostone  24 mcg Oral BID WC  . [facility] HOLD] meloxicam  15 mg Oral Daily  . [facility] HOLD] methocarbamol  500 mg Oral QID  . [facility] HOLD] pantoprazole  40 mg Oral BID   Continuous Infusions: . sodium chloride    . lactated ringers 50 mL/hr at 10/05/13 1332    Principal Problem:   Intertrochanteric fracture of left femur Active Problems:   Hypertension   Chronic back pain   Peptic ulcer   High cholesterol   Hip fracture    Time spent: 25 minutes. Greater than 50% of this time was spent in direct contact with the patient coordinating care.    Lelon Frohlich  [facility] Pager [phone]  If 7PM-7AM, please contact night-coverage at [email or web address], password [facility] 10/05/2013, 2:16 PM  LOS: 1 day

## 2013-10-05 NOTE — Consult Note (Signed)
Reason for Consult: Displaced left hip intertrochanteric fracture Referring Physician: Isaac Bliss  [name] is an 58 y.o. female.  HPI: At approximately 12:30 yesterday the patient was at home up and refill and tripped over her nail fell onto her left side and sustained an isolated moderately displaced left hip intertrochanteric fracture. The patient's family was able to pick her up off the floor and place her in a chair at which time she told them to call an ambulance because of the pain. The patient is known to me for knee replacements over the last couple of years that have done well. She denies any loss of consciousness, she denies any other injuries. 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  Past Medical History  Diagnosis Date  . Hypertension   . High cholesterol   . Fluid retention   . Hypokalemia   . Chronic back pain   . Arthritis   . Pneumonia     hospitalized in March  . Cancer 1992    cervical  . Diverticulosis   . Peptic ulcer     Past Surgical History  Procedure Laterality Date  . Abdominal hysterectomy    . Cholecystectomy    . Knee surgery      right and left  . Total knee arthroplasty Right 01/29/2013    Procedure: TOTAL KNEE ARTHROPLASTY WITH HARDWARE REMOVAL;  Surgeon: [name], MD;  Location: [place];  Service: Orthopedics;  Laterality: Right;  DEPUY/SIGMA RP, SYNTHES SCREWDRIVERS  . Total knee arthroplasty Left 06/11/2013    Dr Mayer Camel  . Colonoscopy with esophagogastroduodenoscopy (egd) N/A 06/08/2013    Procedure: COLONOSCOPY WITH ESOPHAGOGASTRODUODENOSCOPY (EGD);  Surgeon: Daneil Dolin, MD;  [facility];  Service: Endoscopy;  Laterality: N/A;  7:30-moved to 8:00am Dr. Request Time  . Total knee arthroplasty Left 06/11/2013    Procedure: TOTAL KNEE ARTHROPLASTY;  Surgeon: [name], MD;  Location: [place];  Service: Orthopedics;  Laterality: Left;     Family History  Problem Relation Age of Onset  . Ulcers Father   . Colon cancer Neg Hx   . Kidney cancer Neg Hx     Social History:  reports that she has been smoking Cigarettes.  She has a 21.5 pack-year smoking history. She has never used smokeless tobacco. She reports that she drinks alcohol. She reports that she does not use illicit drugs.  Allergies:  Allergies  Allergen Reactions  . Clindamycin/Lincomycin Swelling    Swelling feet  . Codeine Itching  . Levaquin [Levofloxacin] Swelling  . Nsaids     Needs to avoid d/t peptic ulcer    Medications: I have reviewed the patient's current medications.  Results for orders placed during the hospital encounter of 10/04/13 (from the past 48 hour(s))  CBC WITH DIFFERENTIAL     Status: Abnormal   Collection Time    10/04/13  3:47 PM      Result Value Ref Range   WBC 13.0 (*) 4.0 - 10.5 K/uL   RBC 4.74  3.87 - 5.11 MIL/uL   Hemoglobin 14.3  12.0 - 15.0 g/dL   HCT 43.4  36.0 - 46.0 %   MCV 91.6  78.0 - 100.0 fL   MCH 30.2  26.0 - 34.0 pg   MCHC 32.9  30.0 - 36.0 g/dL   RDW 13.0  11.5 - 15.5 %   Platelets 240  150 - 400 K/uL  Neutrophils Relative % 76  43 - 77 %   Neutro Abs 9.9 (*) 1.7 - 7.7 K/uL   Lymphocytes Relative 17  12 - 46 %   Lymphs Abs 2.2  0.7 - 4.0 K/uL   Monocytes Relative 4  3 - 12 %   Monocytes Absolute 0.6  0.1 - 1.0 K/uL   Eosinophils Relative 3  0 - 5 %   Eosinophils Absolute 0.3  0.0 - 0.7 K/uL   Basophils Relative 0  0 - 1 %   Basophils Absolute 0.0  0.0 - 0.1 K/uL  BASIC METABOLIC PANEL     Status: Abnormal   Collection Time    10/04/13  3:47 PM      Result Value Ref Range   Sodium 144  137 - 147 mEq/L   Potassium 4.0  3.7 - 5.3 mEq/L   Chloride 101  96 - 112 mEq/L   CO2 28  19 - 32 mEq/L   Glucose, Bld 100 (*) 70 - 99 mg/dL   BUN 17  6 - 23 mg/dL   Creatinine, Ser 3.65  0.50 - 1.10 mg/dL   Calcium 31.1  8.4 - 39.4 mg/dL   GFR calc non Af Amer 69 (*) >90 mL/min   GFR calc Af Amer 80 (*)  >90 mL/min   Comment: (NOTE)     The eGFR has been calculated using the CKD EPI equation.     This calculation has not been validated in all clinical situations.     eGFR's persistently <90 mL/min signify possible Chronic Kidney     Disease.  URINALYSIS, ROUTINE W REFLEX MICROSCOPIC     Status: None   Collection Time    10/04/13  4:26 PM      Result Value Ref Range   Color, Urine YELLOW  YELLOW   APPearance CLEAR  CLEAR   Specific Gravity, Urine 1.010  1.005 - 1.030   pH 6.5  5.0 - 8.0   Glucose, UA NEGATIVE  NEGATIVE mg/dL   Hgb urine dipstick NEGATIVE  NEGATIVE   Bilirubin Urine NEGATIVE  NEGATIVE   Ketones, ur NEGATIVE  NEGATIVE mg/dL   Protein, ur NEGATIVE  NEGATIVE mg/dL   Urobilinogen, UA 0.2  0.0 - 1.0 mg/dL   Nitrite NEGATIVE  NEGATIVE   Leukocytes, UA NEGATIVE  NEGATIVE   Comment: MICROSCOPIC NOT DONE ON URINES WITH NEGATIVE PROTEIN, BLOOD, LEUKOCYTES, NITRITE, OR GLUCOSE <1000 mg/dL.  PROTIME-INR     Status: None   Collection Time    10/04/13  9:47 PM      Result Value Ref Range   Prothrombin Time 12.2  11.6 - 15.2 seconds   INR 0.92  0.00 - 1.49  APTT     Status: None   Collection Time    10/04/13  9:47 PM      Result Value Ref Range   aPTT 28  24 - 37 seconds  TYPE AND SCREEN     Status: None   Collection Time    10/04/13  9:47 PM      Result Value Ref Range   ABO/RH(D) A POS     Antibody Screen POS     Sample Expiration 10/07/2013     Antibody Identification ANTI-M     DAT, IgG NEG     Unit Number [id number]     Blood Component Type RED CELLS,LR     Unit division 00     Status of Unit ALLOCATED     Transfusion Status  OK TO TRANSFUSE     Crossmatch Result COMPATIBLE     Unit Number [id number]     Blood Component Type RED CELLS,LR     Unit division 00     Status of Unit ALLOCATED     Transfusion Status PENDING     Crossmatch Result PENDING    SURGICAL PCR SCREEN     Status: None   Collection Time    10/05/13  2:05 AM      Result Value Ref  Range   MRSA, PCR NEGATIVE  NEGATIVE   Staphylococcus aureus NEGATIVE  NEGATIVE   Comment:            The Xpert SA Assay (FDA     approved for NASAL specimens     in patients over 43 years of age),     is one component of     a comprehensive surveillance     program.  Test performance has     been validated by [employer] for patients greater     than or equal to 65 year old.     It is not intended     to diagnose infection nor to     guide or monitor treatment.    Dg Chest 1 View  10/04/2013   CLINICAL DATA:  Low back pain.  Left hip pain.  Status post fall.  EXAM: CHEST - 1 VIEW  COMPARISON:  PA lateral chest 11/22/2012.  FINDINGS: Heart size is upper normal. Lungs are clear. No pneumothorax or pleural effusion.  IMPRESSION: No acute disease.   Electronically Signed   By: Inge Rise M.D.   On: 10/04/2013 15:33   Dg Lumbar Spine Complete  10/04/2013   CLINICAL DATA:  Left hip pain.  Low back pain.  Fall.  Hypertension.  EXAM: LUMBAR SPINE - COMPLETE 4+ VIEW  COMPARISON:  CT ABD - PELV W/ CM dated 03/22/2013  FINDINGS: Lumbar spurring observed with facet arthropathy particularly at L4-5 and L5-S1, and to a lesser extent at L3-4 bilaterally.  Aortoiliac atherosclerosis noted.  Body habitus reduces diagnostic sensitivity and specificity. There is 3 mm of degenerative grade 1 anterolisthesis at L4-5, similar to the prior CT scan. No fracture identified.  IMPRESSION: 1. Lower lumbar spondylosis without fracture or acute subluxation. There is 3 mm of degenerative grade 1 anterolisthesis at L4-5, stable from prior exam. 2.  Body habitus reduces diagnostic sensitivity and specificity.   Electronically Signed   By: Sherryl Barters M.D.   On: 10/04/2013 15:33   Dg Hip Complete Left  10/04/2013   CLINICAL DATA:  Status post fall.  Left hip pain.  EXAM: LEFT HIP - COMPLETE 2+ VIEW  COMPARISON:  None.  FINDINGS: The patient has an acute left intertrochanteric fracture. No other acute bony or  joint abnormality is identified. No notable degenerative change.  IMPRESSION: Acute left intertrochanteric fracture.   Electronically Signed   By: Inge Rise M.D.   On: 10/04/2013 15:29    ROS the patient's specifically denies any chest pain shortness of breath nausea or vomiting, she denies any loss of consciousness, she denies any injuries to her upper extremities. Blood pressure 154/80, pulse 85, temperature 98.8 F (37.1 C), temperature source Oral, resp. rate 20, SpO2 98.00%. Physical Exam: Well-nourished well-developed 58 year old woman supine in the hospital bed in no obvious distress. There is no shortness of breath. She is tender to palpation over the lateral anterior aspect of the left hip the  left lower extremity is externally rotated and shortened approximately 1 inch compared to the contralateral side. Her total knee wounds are well-healed there is no sign of infection and no effusions. Any attempted motion of the left lower extremity reproduces pain she has normal sensation to her foot and grossly normal motor examination of the foot.  Assessment/Plan: Assessment: Displaced left hip intertrochanteric fracture Plan: The patient has been admitted to the medicine service per protocol, she'll be taken for open reduction internal fixation using a dynamic hip screw today, she'll be 50% weightbearing postoperatively for proximally 6 weeks and we will anticoagulate her with aspirin. Pain will be controlled with Percocet. The patient has good help at home and would like to return home after a few days in the hospital with home health for physical therapy and nursing.  [name] 10/05/2013, 6:49 AM

## 2013-10-05 NOTE — Progress Notes (Signed)
Patients BP 190/106 at 1745. MD made aware and ordered Hydralazine S1SE  PRN for Systolic LT>532. Given at 1815. Rechecked pt BP at 1830, BP 188/81. Will continue to monitor.

## 2013-10-05 NOTE — Anesthesia Procedure Notes (Signed)
Procedure Name: Intubation Date/Time: 10/05/2013 1:57 PM Performed by: Jenne Campus Pre-anesthesia Checklist: Patient identified, Emergency Drugs available, Suction available, Patient being monitored and Timeout performed Patient Re-evaluated:Patient Re-evaluated prior to inductionOxygen Delivery Method: Circle system utilized Preoxygenation: Pre-oxygenation with 100% oxygen Intubation Type: IV induction Ventilation: Mask ventilation without difficulty Laryngoscope Size: Miller and 2 Grade View: Grade I Tube type: Oral Tube size: 7.0 mm Number of attempts: 1 Airway Equipment and Method: Stylet Placement Confirmation: ETT inserted through vocal cords under direct vision,  positive ETCO2,  CO2 detector and breath sounds checked- equal and bilateral Secured at: 22 cm Tube secured with: Tape Dental Injury: Teeth and Oropharynx as per pre-operative assessment

## 2013-10-05 NOTE — Op Note (Signed)
DATE OF PROCEDURE: 03/30/2012  PREOPERATIVE DIAGNOSIS: L hip intertrochanteric fracture 2-part with varus displacement  POSTOPERATIVE DIAGNOSIS: Same  PROCEDURE: Open reduction internal fixation left hip intertrochanteric fracture using a DePuy TK 2 4 hole 140 short barrel sideplate, 95 mm lag screw keyed  SURGEON: Genee Rann J  ASSISTANT: Eric K. Barton Dubois  (present throughout entire procedure and necessary for timely completion of the procedure) ANESTHESIA: General  BLOOD LOSS: 300 cc  FLUID REPLACEMENT: 1200 cc crystalloid  DRAINS: Foley Catheter  URINE OUTPUT: 295MW  COMPLICATIONS: none   INDICATIONS FOR PROCEDURE: L hip intertrochanteric fracture, 2-part, sustained from a fall. Patient. presented to the emergency room, was admitted by the medicine service and orthopedic consultation was obtained. To decrease pain and increase function we have recommended open reduction internal fixation using a dynamic hip screw. The risks, benefits, and alternatives were discussed at length including but not limited to the risks of infection, bleeding, nerve injury, stiffness, blood clots, the need for revision surgery, cardiopulmonary complications, among others, and they were willing to proceed. Benefits have been discussed. Questions answered.   PROCEDURE IN DETAIL: The patient was identified by armband,  received preoperative IV antibiotics in the holding area, taken to the operating room , appropriate anesthetic monitors were attached and general endotracheal anesthesia induced. Pt. was then transferred to a radiolucent flat Jackson table, rolled into the R lateral decubitus position and fixed there with a Stulberg Mark 2 pelvic clamp. Under C-arm imaging control we then performed a closed reduction with abduction and internal rotation obtaining a near-anatomic reduction with the leg in full extension. The lateral aspect of the hip and thigh was then prepped and draped in usual sterile fashion in the  iliac crest to the knee. A timeout procedure was performed. A 10 centimeter incision starting out at the flare of the greater trochanter and going distally was made along the lateral thigh through the skin and subcutaneous tissue down to the level of the IT band which was then cut in line with the skin incision exposing the vastus lateralis. This was likewise split taking [place] down to the flare of the greater trochanter and down the lateral side of the femur for about 8-10 cm. Under C-arm image control we then placed a guide pin at at 140 angle to the lateral flare of the greater trochanter up the femoral neck and into the center of the femoral head on the AP and lateral views. This measured at 105 mm and the triple reamer was set at 95 mm. Bone quality was actually quite good during the reaming. We then used to tap to prepare the drill hole for an 95 mm lag screw, which was placed without difficulty over the guide pin. We then selected a 135 4-hole sideplate placed it over the lag screw and fixed it to the lateral femur with 4 bicortical 4.5 mm screws. C-arm images were taken confirming a near-anatomic reduction. The wound was then thoroughly irrigated with normal saline solution. The vastus lateralis was closed with running 0 Vicryl suture, the IT band with running #1 Vicryl suture, the subcutaneous tissue with 0 and 2-0 undyed Vicryl suture and the skin with 3-0 nylon. A dressing of Mepilex was then applied the patient was unclamped rolled supine awakened extubated and taken to the recovery room without difficulty.  Frederik Pear J  03/30/2012, 7:29 PM

## 2013-10-05 NOTE — Anesthesia Postprocedure Evaluation (Signed)
Anesthesia Post Note  Patient: [name]  Procedure(s) Performed: Procedure(s) (LRB): ORIF LEFT HIP  INTERTROCHANTRIC FRACTURE (Left)  Anesthesia type: general  Patient location: PACU  Post pain: Pain level controlled  Post assessment: Patient's Cardiovascular Status Stable  Last Vitals:  Filed Vitals:   10/05/13 1630  BP: 178/101  Pulse: 93  Temp:   Resp: 15    Post vital signs: Reviewed and stable  Level of consciousness: sedated  Complications: No apparent anesthesia complications

## 2013-10-05 NOTE — Preoperative (Signed)
Beta Blockers   Reason not to administer Beta Blockers:Not Applicable 

## 2013-10-05 NOTE — Progress Notes (Signed)
Utilization review completed. Berneita Sanagustin, RN, BSN. 

## 2013-10-05 NOTE — Anesthesia Preprocedure Evaluation (Addendum)
Anesthesia Evaluation  Patient identified by MRN, date of birth, ID band Patient awake    Reviewed: Allergy & Precautions, H&P , NPO status , Patient's Chart, lab work & pertinent test results  History of Anesthesia Complications Negative for: history of anesthetic complications  Airway Mallampati: II TM Distance: >3 FB Neck ROM: Full    Dental  (+) Dental Advisory Given, Edentulous Upper, Edentulous Lower   Pulmonary Current Smoker,    Pulmonary exam normal       Cardiovascular hypertension, Pt. on medications     Neuro/Psych negative neurological ROS  negative psych ROS   GI/Hepatic Neg liver ROS, PUD,   Endo/Other  Morbid obesity  Renal/GU negative Renal ROS     Musculoskeletal   Abdominal   Peds  Hematology negative hematology ROS (+)   Anesthesia Other Findings   Reproductive/Obstetrics                         Anesthesia Physical Anesthesia Plan  ASA: III  Anesthesia Plan: General   Post-op Pain Management:    Induction: Intravenous  Airway Management Planned: Oral ETT  Additional Equipment:   Intra-op Plan:   Post-operative Plan: Extubation in OR  Informed Consent: I have reviewed the patients History and Physical, chart, labs and discussed the procedure including the risks, benefits and alternatives for the proposed anesthesia with the patient or authorized representative who has indicated his/her understanding and acceptance.   Dental advisory given  Plan Discussed with: CRNA, Anesthesiologist and Surgeon  Anesthesia Plan Comments:        Anesthesia Quick Evaluation

## 2013-10-06 LAB — CBC
HCT: 37 % (ref 36.0–46.0)
Hemoglobin: 12.1 g/dL (ref 12.0–15.0)
MCH: 30 pg (ref 26.0–34.0)
MCHC: 32.7 g/dL (ref 30.0–36.0)
MCV: 91.8 fL (ref 78.0–100.0)
PLATELETS: 222 10*3/uL (ref 150–400)
RBC: 4.03 MIL/uL (ref 3.87–5.11)
RDW: 13.4 % (ref 11.5–15.5)
WBC: 12.5 10*3/uL — ABNORMAL HIGH (ref 4.0–10.5)

## 2013-10-06 LAB — BASIC METABOLIC PANEL
BUN: 10 mg/dL (ref 6–23)
CALCIUM: 9.2 mg/dL (ref 8.4–10.5)
CO2: 25 meq/L (ref 19–32)
CREATININE: 0.82 mg/dL (ref 0.50–1.10)
Chloride: 104 mEq/L (ref 96–112)
GFR calc Af Amer: 89 mL/min — ABNORMAL LOW (ref >90)
GFR, EST NON AFRICAN AMERICAN: 77 mL/min — AB (ref >90)
GLUCOSE: 108 mg/dL — AB (ref 70–99)
Potassium: 4.2 mEq/L (ref 3.7–5.3)
SODIUM: 142 meq/L (ref 137–147)

## 2013-10-06 LAB — GLUCOSE, CAPILLARY: Glucose-Capillary: 120 mg/dL — ABNORMAL HIGH (ref 70–99)

## 2013-10-06 MED ORDER — METHOCARBAMOL 500 MG PO TABS: 500.0000 mg | ORAL_TABLET | Freq: Two times a day (BID) | ORAL | Status: DC

## 2013-10-06 MED ORDER — ASPIRIN EC 325 MG PO TBEC: 325.0000 mg | DELAYED_RELEASE_TABLET | Freq: Two times a day (BID) | ORAL | Status: DC

## 2013-10-06 MED ORDER — OXYCODONE-ACETAMINOPHEN 5-325 MG PO TABS: 1.0000 | ORAL_TABLET | ORAL | Status: DC | PRN

## 2013-10-06 NOTE — Evaluation (Signed)
Physical Therapy Evaluation Patient Details Name: [name] MRN: [id number] DOB: 07-13-1956 Today's Date: 10/06/2013 Time: 6270-3500 PT Time Calculation (min): 32 min  PT Assessment / Plan / Recommendation History of Present Illness  58 y.o. female admitted to [facility] on 10/04/13 with h/o PUD, htn, hld, bil TKA with left most recently done in Oct of 2014.  She comes in after having a mechanical fall resulting in left hip fracture.  Her rubber boot got stuck on a nail sticking out of her ramp walkway, which caused her to trip and fall.  Pt is now s/p ORIF to left hip.  Conflicting WB status and note says posterior hip precautions, but no orders for posterior hip precautions.  Called PA to clarify both WB status and hip precautions.    Clinical Impression  Pt is POD #1 s/p L hip ORIF s/p fall at home.  She is moving well and should progress to being able to go home with family's assist at D/C.  She would benefit from HHPT f/u at discharge.   PT to follow acutely for deficits listed below.       PT Assessment  Patient needs continued PT services    Follow Up Recommendations  Home health PT;Supervision for mobility/OOB    Does the patient have the potential to tolerate intense rehabilitation     NA  Barriers to Discharge   None      Equipment Recommendations  None recommended by PT    Recommendations for Other Services   NA  Frequency Min 5X/week    Precautions / Restrictions Precautions Precautions: Posterior Hip Precaution Comments: progress note on eval says posterior hip precautions, but no order for posterior hip precautions Restrictions Weight Bearing Restrictions: Yes LUE Weight Bearing: Partial weight bearing LUE Partial Weight Bearing Percentage or Pounds: 50 Other Position/Activity Restrictions: Note says WBAT, orders have PWB 50%, called PA to clarify WB status   Pertinent Vitals/Pain See vitals flow sheet.       Mobility  Transfers Overall transfer level: Needs  assistance Equipment used: Rolling walker (2 wheeled) Transfers: Sit to/from Stand Sit to Stand: Min assist;From elevated surface General transfer comment: min assist to support trunk and stabilize RW during transitions.  Verbal cues for safe hand placement.  Ambulation/Gait Ambulation/Gait assistance: Min assist Ambulation Distance (Feet): 50 Feet Assistive device: Rolling walker (2 wheeled) Gait Pattern/deviations: Step-to pattern;Trunk flexed;Shuffle;Antalgic Gait velocity: decreased Gait velocity interpretation: <1.8 ft/sec, indicative of risk for recurrent falls General Gait Details: Pt with normal post-op antalgic gait pattern.  Min assist for safey and stability of trunk over painful legs.  Verbal cues for safest LE sequencing, upright posutre.      Exercises Total Joint Exercises Ankle Circles/Pumps: AROM;Both;10 reps;Seated Quad Sets: AROM;Both;10 reps;Supine Short Arc Quad: AAROM;Left;10 reps;Supine Heel Slides: AAROM;Left;10 reps;Supine Hip ABduction/ADduction: AAROM;Left;10 reps;Supine Long Arc Quad: AROM;Left;10 reps;Seated   PT Diagnosis: Difficulty walking;Abnormality of gait;Generalized weakness;Acute pain  PT Problem List: Decreased strength;Decreased activity tolerance;Decreased range of motion;Decreased mobility;Decreased balance;Decreased knowledge of use of DME;Decreased knowledge of precautions;Pain PT Treatment Interventions: DME instruction;Gait training;Functional mobility training;Therapeutic activities;Therapeutic exercise;Balance training;Neuromuscular re-education;Patient/family education;Modalities     PT Goals(Current goals can be found in the care plan section) Acute Rehab PT Goals Patient Stated Goal: to get better and go home PT Goal Formulation: With patient Time For Goal Achievement: 10/13/13 Potential to Achieve Goals: Good  Visit Information  Last PT Received On: 10/06/13 Assistance Needed: +1 History of Present Illness: 58 y.o. female  admitted to [facility]  on 10/04/13 with h/o PUD, htn, hld, bil TKA with left most recently done in Oct of 2014.  She comes in after having a mechanical fall resulting in left hip fracture.  Her rubber boot got stuck on a nail sticking out of her ramp walkway, which caused her to trip and fall.  Pt is now s/p ORIF to left hip.  Conflicting WB status and note says posterior hip precautions, but no orders for posterior hip precautions.  Called PA to clarify both WB status and hip precautions.         Prior Functioning  Home Living Family/patient expects to be discharged to:: Private residence Living Arrangements: Children;Other relatives (brother, son (son is CNA)) Available Help at Discharge: Family;Available 24 hours/day Type of Home: House Home Access: Ramped entrance Home Layout: One level Home Equipment: Walker - 2 wheels;Bedside commode;Grab bars - toilet;Hand held shower head;Grab bars - tub/shower Additional Comments: Pt had R TKA june 2014 and L TKA Oct 2014 Prior Function Level of Independence: Independent Comments: Finished therapy and not walking with an assistive device.  Communication Communication: No difficulties Dominant Hand: Right    Cognition  Cognition Arousal/Alertness: Awake/alert Behavior During Therapy: WFL for tasks assessed/performed Overall Cognitive Status: Within Functional Limits for tasks assessed    Extremity/Trunk Assessment Upper Extremity Assessment Upper Extremity Assessment: Overall WFL for tasks assessed Lower Extremity Assessment Lower Extremity Assessment: LLE deficits/detail LLE Deficits / Details: normal post op pain and weakness, ankle 4/5, knee 3-/5, hip 2+/5 Cervical / Trunk Assessment Cervical / Trunk Assessment: Normal   Balance Balance Overall balance assessment: Needs assistance Sitting-balance support: No upper extremity supported;Feet supported Sitting balance-Leahy Scale: Good Standing balance support: Bilateral upper extremity  supported Standing balance-Leahy Scale: Poor  End of Session PT - End of Session Equipment Utilized During Treatment: Gait belt Activity Tolerance: Patient limited by fatigue;Patient limited by pain Patient left: in chair;with call bell/phone within reach Nurse Communication: Mobility status    Wells Guiles B. [place], [place], DPT [phone]   10/06/2013, 12:02 PM

## 2013-10-06 NOTE — Progress Notes (Signed)
[facility] PROGRESS NOTE  [name] DPO:[id number] DOB: 06-23-1956 DOA: 10/04/2013 PCP: Leonides Grills, MD  Assessment/Plan: Acute Left Hip Fracture -S/p repair 2/13. -Management as per ortho. -Patient hopes to be able to return home with [facility] services.  HTN -Continue home medications. -Fair control.  PUD -Continue protonix.    Code Status: Full Code Family Communication: Patient only  Disposition Plan: Home when ready, likely in am.   Consultants:  Ortho   Antibiotics:  None   Subjective: No complaints other than mild left hip pain,  Objective: Filed Vitals:   10/06/13 0957 10/06/13 1100 10/06/13 1159 10/06/13 1411  BP: 156/80 141/81  157/86  Pulse: 86 83  99  Temp:  97.9 F (36.6 C)  98 F (36.7 C)  TempSrc:  Oral  Oral  Resp:  18 16 16   SpO2:  96% 97% 96%    Intake/Output Summary (Last 24 hours) at 10/06/13 1501 Last data filed at 10/06/13 1100  Gross per 24 hour  Intake 2541.67 ml  Output   4550 ml  Net -2008.33 ml   There were no vitals filed for this visit.  Exam:   General:  AA Ox3  Cardiovascular: RRR  Respiratory: CTA B  Abdomen: S/NT/ND/+BS  Extremities: trace bilateral edema   Neurologic:  Non-focal  Data Reviewed: Basic Metabolic Panel:  Recent Labs Lab 10/04/13 1547 10/05/13 0710 10/06/13 0456  NA 144 140 142  K 4.0 3.9 4.2  CL 101 102 104  CO2 28 26 25   GLUCOSE 100* 98 108*  BUN 17 19 10   CREATININE 0.90 1.06 0.82  CALCIUM 10.1 9.3 9.2   Liver Function Tests: No results found for this basename: AST, ALT, ALKPHOS, BILITOT, PROT, ALBUMIN,  in the last 168 hours No results found for this basename: LIPASE, AMYLASE,  in the last 168 hours No results found for this basename: AMMONIA,  in the last 168 hours CBC:  Recent Labs Lab 10/04/13 1547 10/05/13 0710 10/06/13 0456  WBC 13.0* 10.1 12.5*  NEUTROABS 9.9*  --   --   HGB 14.3 12.4 12.1  HCT 43.4 37.7 37.0  MCV 91.6 91.3 91.8  PLT 240 223  222   Cardiac Enzymes: No results found for this basename: CKTOTAL, CKMB, CKMBINDEX, TROPONINI,  in the last 168 hours BNP (last 3 results) No results found for this basename: PROBNP,  in the last 8760 hours CBG:  Recent Labs Lab 10/06/13 0632  GLUCAP 120*    Recent Results (from the past 240 hour(s))  SURGICAL PCR SCREEN     Status: None   Collection Time    10/05/13  2:05 AM      Result Value Ref Range Status   MRSA, PCR NEGATIVE  NEGATIVE Final   Staphylococcus aureus NEGATIVE  NEGATIVE Final   Comment:            The Xpert SA Assay (FDA     approved for NASAL specimens     in patients over 58 years of age),     is one component of     a comprehensive surveillance     program.  Test performance has     been validated by [employer] for patients greater     than or equal to 30 year old.     It is not intended     to diagnose infection nor to     guide or monitor treatment.     Studies: Dg Chest  1 View  10/04/2013   CLINICAL DATA:  Low back pain.  Left hip pain.  Status post fall.  EXAM: CHEST - 1 VIEW  COMPARISON:  PA lateral chest 11/22/2012.  FINDINGS: Heart size is upper normal. Lungs are clear. No pneumothorax or pleural effusion.  IMPRESSION: No acute disease.   Electronically Signed   By: Inge Rise M.D.   On: 10/04/2013 15:33   Dg Lumbar Spine Complete  10/04/2013   CLINICAL DATA:  Left hip pain.  Low back pain.  Fall.  Hypertension.  EXAM: LUMBAR SPINE - COMPLETE 4+ VIEW  COMPARISON:  CT ABD - PELV W/ CM dated 03/22/2013  FINDINGS: Lumbar spurring observed with facet arthropathy particularly at L4-5 and L5-S1, and to a lesser extent at L3-4 bilaterally.  Aortoiliac atherosclerosis noted.  Body habitus reduces diagnostic sensitivity and specificity. There is 3 mm of degenerative grade 1 anterolisthesis at L4-5, similar to the prior CT scan. No fracture identified.  IMPRESSION: 1. Lower lumbar spondylosis without fracture or acute subluxation. There is 3 mm  of degenerative grade 1 anterolisthesis at L4-5, stable from prior exam. 2.  Body habitus reduces diagnostic sensitivity and specificity.   Electronically Signed   By: Sherryl Barters M.D.   On: 10/04/2013 15:33   Dg Hip Complete Left  10/04/2013   CLINICAL DATA:  Status post fall.  Left hip pain.  EXAM: LEFT HIP - COMPLETE 2+ VIEW  COMPARISON:  None.  FINDINGS: The patient has an acute left intertrochanteric fracture. No other acute bony or joint abnormality is identified. No notable degenerative change.  IMPRESSION: Acute left intertrochanteric fracture.   Electronically Signed   By: Inge Rise M.D.   On: 10/04/2013 15:29   Dg Hip Operative Left  10/05/2013   CLINICAL DATA:  Intertrochanteric nail left hip.  EXAM: OPERATIVE LEFT HIP  COMPARISON:  10/04/2013  FINDINGS: Lateral fixation plate over the proximal left femoral diaphysis with associated compression screw bridging the femoral neck into the femoral head as hardware is intact and normally located. There is anatomic alignment about patient's intertrochanteric fracture.  IMPRESSION: Post fixation of intertrochanteric fracture with hardware intact and anatomic alignment about the fracture site.   Electronically Signed   By: Marin Olp M.D.   On: 10/05/2013 15:38   Dg Pelvis Portable  10/05/2013   CLINICAL DATA:  Left femoral fracture  EXAM: PORTABLE PELVIS 1-2 VIEWS  COMPARISON:  10/04/2013  FINDINGS: The fixation sideplate was compression screw is now seen traversing the intertrochanteric fracture. The fracture fragments are in near anatomic alignment. No other focal abnormality is noted.   Electronically Signed   By: Inez Catalina M.D.   On: 10/05/2013 16:22    Scheduled Meds: . aspirin EC  325 mg Oral Q breakfast  . furosemide  20 mg Oral Daily  . gemfibrozil  600 mg Oral BID AC  . Linaclotide  290 mcg Oral Daily  . lisinopril  20 mg Oral Daily  . lubiprostone  24 mcg Oral BID WC  . meloxicam  15 mg Oral Daily  . methocarbamol   500 mg Oral QID  . pantoprazole  40 mg Oral BID   Continuous Infusions: . dextrose 5 % and 0.45 % NaCl with KCl 20 mEq/L Stopped (10/06/13 0837)    Principal Problem:   Intertrochanteric fracture of left femur Active Problems:   Hypertension   Chronic back pain   Peptic ulcer   High cholesterol   Hip fracture   Hip fracture, left  Time spent: 25 minutes. Greater than 50% of this time was spent in direct contact with the patient coordinating care.    Lelon Frohlich  [facility] Pager [phone]  If 7PM-7AM, please contact night-coverage at [email or web address], password [facility] 10/06/2013, 3:01 PM  LOS: 2 days

## 2013-10-06 NOTE — Progress Notes (Signed)
10/06/2013 Spoke with Ortho PA, Eric to clarify precautions and WB status.  Pt is 50% PWB and has no hip precautions for her left leg.    Thanks, Barbarann Ehlers. [place], [place], DPT [phone]

## 2013-10-06 NOTE — Progress Notes (Signed)
PATIENT ID: [name]  MRN: [id number]  DOB/AGE:  1955/10/22 / 58 y.o.  1 Day Post-Op Procedure(s) (LRB): ORIF LEFT HIP  INTERTROCHANTRIC FRACTURE (Left)    PROGRESS NOTE Subjective: Patient is alert, oriented,no Nausea, no Vomiting, yes passing gas, no Bowel Movement. Taking PO well. Denies SOB, Chest or Calf Pain. Using Incentive Spirometer, PAS in place. Ambulate  Partial Weight bearing at 50% Patient reports pain as mild  .    Objective: Vital signs in last 24 hours: Filed Vitals:   10/05/13 1745 10/05/13 2113 10/06/13 0136 10/06/13 0300  BP: 190/106 172/79 149/82 159/84  Pulse:  97 96 82  Temp:  98.6 F (37 C) 98.6 F (37 C) 98.4 F (36.9 C)  TempSrc:  Oral Oral Oral  Resp:  18 18 18   SpO2:  99% 99% 98%      Intake/Output from previous day: I/O last 3 completed shifts: In: 3580 [P.O.:1080; I.V.:2500] Out: 6550 [Urine:6450; Blood:100]   Intake/Output this shift:     LABORATORY DATA:  Recent Labs  10/04/13 2147 10/05/13 0710 10/06/13 0456 10/06/13 0632  WBC  --  10.1 12.5*  --   HGB  --  12.4 12.1  --   HCT  --  37.7 37.0  --   PLT  --  223 222  --   NA  --  140 142  --   K  --  3.9 4.2  --   CL  --  102 104  --   CO2  --  26 25  --   BUN  --  19 10  --   CREATININE  --  1.06 0.82  --   GLUCOSE  --  98 108*  --   GLUCAP  --   --   --  120*  INR 0.92  --   --   --   CALCIUM  --  9.3 9.2  --     Examination: Neurologically intact Neurovascular intact Sensation intact distally Intact pulses distally Dorsiflexion/Plantar flexion intact Incision: dressing C/D/I No cellulitis present Compartment soft} XR AP&Lat of hip shows well placed\fixed THA  Assessment:   1 Day Post-Op Procedure(s) (LRB): ORIF LEFT HIP  INTERTROCHANTRIC FRACTURE (Left) ADDITIONAL DIAGNOSIS:  Hypertension  Plan: PT/OT WBAT, THA  posterior precautions  DVT Prophylaxis: SCDx72 hrs, ASA 325 mg BID x 2 weeks  DISCHARGE PLAN: Home when pt passes PT goals and cleared with  Medicine.  DISCHARGE NEEDS: HHPT, HHRN, Walker and 3-in-1 comode seat

## 2013-10-06 NOTE — Evaluation (Signed)
Occupational Therapy Evaluation Patient Details Name: [name] MRN: [id number] DOB: September 24, 1955 Today's Date: 10/06/2013 Time: 5366-4403 OT Time Calculation (min): 18 min  OT Assessment / Plan / Recommendation History of present illness 58 y.o. female admitted to [facility] on 10/04/13 with h/o PUD, htn, hld, bil TKA with left most recently done in Oct of 2014.  She comes in after having a mechanical fall resulting in left hip fracture.  Her rubber boot got stuck on a nail sticking out of her ramp walkway, which caused her to trip and fall.  Pt is now s/p ORIF to left hip.  Conflicting WB status and note says posterior hip precautions, but no orders for posterior hip precautions.  Called PA to clarify both WB status and hip precautions.     Clinical Impression   Pt presents with below problem list. Pt independent with ADLs, PTA. Feel pt will benefit from acute OT to increase independence prior to d/c.     OT Assessment  Patient needs continued OT Services    Follow Up Recommendations  No OT follow up;Supervision - Intermittent (when OOB/mobility)    Barriers to Discharge      Equipment Recommendations   (sockaid and long handled sponge)    Recommendations for Other Services    Frequency  Min 2X/week    Precautions / Restrictions Precautions Precautions: Fall Restrictions Weight Bearing Restrictions: Yes LLE Weight Bearing: Partial weight bearing LLE Partial Weight Bearing Percentage or Pounds: 50   Pertinent Vitals/Pain Pain 9/10. Elevated LLE on pillow.     ADL  Grooming: Wash/dry hands;Wash/dry face;Min guard Where Assessed - Grooming: Unsupported standing Upper Body Dressing: Set up Where Assessed - Upper Body Dressing: Supported sitting Lower Body Dressing: Min guard Where Assessed - Lower Body Dressing: Supported sit to Lobbyist: Magazine features editor Method: Sit to Loss adjuster, chartered: Raised toilet seat with arms (or 3-in-1 over  toilet) Toileting - Clothing Manipulation and Hygiene: Min guard Where Assessed - Best boy and Hygiene: Standing;Sit on 3-in-1 or toilet Tub/Shower Transfer Method: Not assessed Equipment Used: Gait belt;Rolling walker;Sock aid;Reacher;Long-handled sponge;Long-handled shoe horn Transfers/Ambulation Related to ADLs: Min guard ADL Comments: Educated on use of bag on walker and also dressing technique. Recommended sitting for dressing and to stand in front of chair/bed with walker in front when pulling up LB clothing. Practiced with reacher and sockaid. OT mentioned having non skid rug in bathroom. Spoke about elastic shoe laces if needed, but pt states most shoes she wears does not have laces.   OT Diagnosis: Acute pain  OT Problem List: Decreased knowledge of precautions;Decreased knowledge of use of DME or AE;Impaired balance (sitting and/or standing);Decreased range of motion;Decreased activity tolerance;Pain OT Treatment Interventions: Self-care/ADL training;DME and/or AE instruction;Therapeutic activities;Patient/family education;Balance training   OT Goals(Current goals can be found in the care plan section) Acute Rehab OT Goals Patient Stated Goal: go home OT Goal Formulation: With patient Time For Goal Achievement: 10/13/13 Potential to Achieve Goals: Good ADL Goals Pt Will Perform Lower Body Bathing: with modified independence;with adaptive equipment;sit to/from stand Pt Will Perform Lower Body Dressing: with modified independence;sit to/from stand;with adaptive equipment Pt Will Transfer to Toilet: with modified independence;ambulating;bedside commode Pt Will Perform Toileting - Clothing Manipulation and hygiene: with modified independence;sit to/from stand  Visit Information  Last OT Received On: 10/06/13 Assistance Needed: +1 History of Present Illness: 58 y.o. female admitted to [facility] on 10/04/13 with h/o PUD, htn, hld, bil TKA with left most recently  done in  Oct of 2014.  She comes in after having a mechanical fall resulting in left hip fracture.  Her rubber boot got stuck on a nail sticking out of her ramp walkway, which caused her to trip and fall.  Pt is now s/p ORIF to left hip.  Conflicting WB status and note says posterior hip precautions, but no orders for posterior hip precautions.  Called PA to clarify both WB status and hip precautions.         Prior Functioning     Home Living Family/patient expects to be discharged to:: Private residence Living Arrangements: Children;Other relatives (brother, son is CNA) Available Help at Discharge: Family;Available 24 hours/day Type of Home: House Home Access: Ramped entrance Home Layout: One level Home Equipment: Walker - 2 wheels;Bedside commode;Grab bars - toilet;Hand held shower head;Grab bars - tub/shower;Adaptive equipment Adaptive Equipment: Reacher Additional Comments: Pt had R TKA june 2014 and L TKA Oct 2014 Prior Function Level of Independence: Independent Comments: Finished therapy and not walking with an assistive device.  Communication Communication: No difficulties Dominant Hand: Right         Vision/Perception     Cognition  Cognition Arousal/Alertness: Awake/alert Behavior During Therapy: WFL for tasks assessed/performed Overall Cognitive Status: Within Functional Limits for tasks assessed    Extremity/Trunk Assessment Upper Extremity Assessment Upper Extremity Assessment: Overall WFL for tasks assessed Lower Extremity Assessment Lower Extremity Assessment: Defer to PT evaluation     Mobility Transfers Overall transfer level: Needs assistance Equipment used: Rolling walker (2 wheeled) Transfers: Sit to/from Stand Sit to Stand: Min guard General transfer comment: Cues for technique.     Exercise     Balance     End of Session OT - End of Session Equipment Utilized During Treatment: Gait belt;Rolling walker Activity Tolerance: Patient tolerated  treatment well Patient left: in chair;with call bell/phone within reach  [facility] OTR/L [phone] 10/06/2013, 5:03 PM

## 2013-10-06 NOTE — Progress Notes (Signed)
   CARE MANAGEMENT NOTE 10/06/2013  Patient:  [name], [name]   Account Number:  [id number]  Date Initiated:  10/06/2013  Documentation initiated by:  [facility]  Subjective/Objective Assessment:   adm: ORIF LEFT HIP  INTERTROCHANTRIC FRACTURE (Left)     Action/Plan:   discharge planning   Anticipated DC Date:  10/06/2013   Anticipated DC Plan:  [place]  CM consult      [facility] Choice  HOME HEALTH   Choice offered to / List presented to:  C-1 Patient        [place] arranged  HH-1 RN  [place].   Status of service:  Completed, signed off Medicare Important Message given?   (If response is "NO", the following Medicare IM given date fields will be blank) Date Medicare IM given:   Date Additional Medicare IM given:    Discharge Disposition:  [place]  Per UR Regulation:    If discussed at Long Length of Stay Meetings, dates discussed:    Comments:  10/06/13 10:00 CM spoke with pt in room to offer choice.  Pt has requested Tressia Danas of [facility] to render her HHPT and also has [facility] ordered.  Cm sexplained [facility] tries to accomodate requests but pt understands this may not happen depending on schedules of staffing.  Pt states she does not need any DMe as she just had bilateral knee surgery and has all she needs.  Address and contact numbers were verified and referral faxed to [facility] fr HHPT/RN with request for PT [facility].  No other CM needs were communicated.  Mariane Masters, BSN, CM [phone].

## 2013-10-07 DIAGNOSIS — R1032 Left lower quadrant pain: Secondary | ICD-10-CM

## 2013-10-07 LAB — CBC
HCT: 33.4 % — ABNORMAL LOW (ref 36.0–46.0)
Hemoglobin: 10.9 g/dL — ABNORMAL LOW (ref 12.0–15.0)
MCH: 30 pg (ref 26.0–34.0)
MCHC: 32.6 g/dL (ref 30.0–36.0)
MCV: 92 fL (ref 78.0–100.0)
PLATELETS: 196 10*3/uL (ref 150–400)
RBC: 3.63 MIL/uL — AB (ref 3.87–5.11)
RDW: 13.4 % (ref 11.5–15.5)
WBC: 11.4 10*3/uL — ABNORMAL HIGH (ref 4.0–10.5)

## 2013-10-07 LAB — BASIC METABOLIC PANEL
BUN: 14 mg/dL (ref 6–23)
CO2: 25 meq/L (ref 19–32)
Calcium: 9 mg/dL (ref 8.4–10.5)
Chloride: 105 mEq/L (ref 96–112)
Creatinine, Ser: 1.07 mg/dL (ref 0.50–1.10)
GFR calc Af Amer: 65 mL/min — ABNORMAL LOW (ref >90)
GFR calc non Af Amer: 56 mL/min — ABNORMAL LOW (ref >90)
Glucose, Bld: 113 mg/dL — ABNORMAL HIGH (ref 70–99)
Potassium: 4.5 mEq/L (ref 3.7–5.3)
SODIUM: 142 meq/L (ref 137–147)

## 2013-10-07 MED ORDER — OXYCODONE-ACETAMINOPHEN 5-325 MG PO TABS: 1.0000 | ORAL_TABLET | ORAL | Status: DC | PRN

## 2013-10-07 NOTE — Progress Notes (Addendum)
PATIENT ID: [name]  MRN: [id number]  DOB/AGE:  60-Mar-1957 / 60 y.o.  2 Days Post-Op Procedure(s) (LRB): ORIF LEFT HIP  INTERTROCHANTRIC FRACTURE (Left)    PROGRESS NOTE Subjective: Patient is alert, oriented,no Nausea, no Vomiting, yes passing gas, yes Bowel Movement. Taking PO well. Denies SOB, Chest or Calf Pain. Using Incentive Spirometer, PAS in place. Ambulate  50 % weight Bearing  Patient reports pain as 4 on 0-10 scale  .    Objective: Vital signs in last 24 hours: Filed Vitals:   10/06/13 2000 10/06/13 2151 10/07/13 0000 10/07/13 0602  BP:  154/73  143/78  Pulse:  88  84  Temp:  97.7 F (36.5 C)  99.1 F (37.3 C)  TempSrc:  Oral  Oral  Resp: 16 16 16    SpO2: 100% 100% 100% 96%      Intake/Output from previous day: I/O last 3 completed shifts: In: 2541.7 [P.O.:1080; I.V.:1461.7] Out: 4350 [Urine:4350]   Intake/Output this shift:     LABORATORY DATA:  Recent Labs  10/04/13 2147  10/06/13 0456 10/06/13 0632 10/07/13 0533  WBC  --   < > 12.5*  --  11.4*  HGB  --   < > 12.1  --  10.9*  HCT  --   < > 37.0  --  33.4*  PLT  --   < > 222  --  196  NA  --   < > 142  --  142  K  --   < > 4.2  --  4.5  CL  --   < > 104  --  105  CO2  --   < > 25  --  25  BUN  --   < > 10  --  14  CREATININE  --   < > 0.82  --  1.07  GLUCOSE  --   < > 108*  --  113*  GLUCAP  --   --   --  120*  --   INR 0.92  --   --   --   --   CALCIUM  --   < > 9.2  --  9.0  < > = values in this interval not displayed.  Examination: Neurologically intact Neurovascular intact Sensation intact distally Intact pulses distally Dorsiflexion/Plantar flexion intact Incision: dressing C/D/I No cellulitis present Compartment soft} XR AP&Lat of hip shows well placed\fixed THA  Assessment:   2 Days Post-Op Procedure(s) (LRB): ORIF LEFT HIP  INTERTROCHANTRIC FRACTURE (Left) ADDITIONAL DIAGNOSIS:  Hypertension  Plan: PT/OT 50% weight bearing for 6 weeks.  DVT Prophylaxis: SCDx72 hrs,  ASA 325 mg BID x 2 weeks  DISCHARGE PLAN: Home today if pt passes PT and is cleared by Medicine  DISCHARGE NEEDS: HHPT, HHRN, Walker and 3-in-1 comode seat

## 2013-10-07 NOTE — Progress Notes (Signed)
OT Cancellation Note  Patient Details Name: [name] MRN: [id number] DOB: [birth year]   Cancelled Treatment:    Reason Eval/Treat Not Completed: Other (comment) (Pt did not have any further OT concerns.)  Benito Mccreedy OTR/L [phone] 10/07/2013, 12:09 PM

## 2013-10-07 NOTE — Progress Notes (Signed)
Pt discharged to home. D/c instructions given, no questions verbalized. Vitals stable. 

## 2013-10-07 NOTE — Progress Notes (Signed)
Physical Therapy Treatment Patient Details Name: [name] MRN: [id number] DOB: [birth year] Today's Date: 10/07/2013 Time: 2952-8413 PT Time Calculation (min): 45 min  PT Assessment / Plan / Recommendation  History of Present Illness 58 y.o. female admitted to [facility] on 10/04/13 with h/o PUD, htn, hld, bil TKA with left most recently done in Oct of 2014.  She comes in after having a mechanical fall resulting in left hip fracture.  Her rubber boot got stuck on a nail sticking out of her ramp walkway, which caused her to trip and fall.  Pt is now s/p ORIF to left hip.  Conflicting WB status and note says posterior hip precautions, but no orders for posterior hip precautions.  Called PA to clarify both WB status and hip precautions.     PT Comments   Making good progress with mobility; OK for dc home from PT standpoint  Follow Up Recommendations  Home health PT;Supervision for mobility/OOB     Does the patient have the potential to tolerate intense rehabilitation     Barriers to Discharge        Equipment Recommendations  None recommended by PT    Recommendations for Other Services    Frequency Min 5X/week   Progress towards PT Goals Progress towards PT goals: Progressing toward goals  Plan Current plan remains appropriate    Precautions / Restrictions Precautions Precaution Comments: Becca, PT clarified yesterday that pt has no motion restrictions L hip Restrictions LLE Weight Bearing: Partial weight bearing LLE Partial Weight Bearing Percentage or Pounds: 50   Pertinent Vitals/Pain 3/10 L hip; Reported she was happy to be "on her feet"    Mobility  Bed Mobility Overal bed mobility: Needs Assistance Bed Mobility: Supine to Sit Supine to sit: Supervision General bed mobility comments: Overall moving well; elevated bed and used step stool to approximate home; Used RLE to suport LLE coming off of bed Transfers Overall transfer level: Needs assistance Equipment used: Rolling  walker (2 wheeled) Transfers: Sit to/from Stand Sit to Stand: Min guard General transfer comment: Cues for technique. Ambulation/Gait Ambulation/Gait assistance: Supervision Ambulation Distance (Feet): 150 Feet Assistive device: Rolling walker (2 wheeled) Gait Pattern/deviations: Step-to pattern Gait velocity: decreased General Gait Details: Cues for gait sequence and upright posture, as well as 50%PWB Stairs: Yes Stairs assistance: Min guard Stair Management: No rails;Backwards;With walker Number of Stairs: 1 (then practiced 2) General stair comments: Cues for technique and sequence; Practiced one step to approximate in/out of bed, and in/out of SUV; Pt then asked about managing consecutive steps (for access to friends' homes)    Exercises     PT Diagnosis:    PT Problem List:   PT Treatment Interventions:     PT Goals (current goals can now be found in the care plan section) Acute Rehab PT Goals Patient Stated Goal: go home PT Goal Formulation: With patient Time For Goal Achievement: 10/13/13 Potential to Achieve Goals: Good  Visit Information  Last PT Received On: 10/07/13 Assistance Needed: +1 History of Present Illness: 58 y.o. female admitted to [facility] on 10/04/13 with h/o PUD, htn, hld, bil TKA with left most recently done in Oct of 2014.  She comes in after having a mechanical fall resulting in left hip fracture.  Her rubber boot got stuck on a nail sticking out of her ramp walkway, which caused her to trip and fall.  Pt is now s/p ORIF to left hip.  Conflicting WB status and note says posterior hip precautions, but  no orders for posterior hip precautions.  Called PA to clarify both WB status and hip precautions.      Subjective Data  Subjective: Hoping to go home today; Moving well Patient Stated Goal: go home   Cognition  Cognition Arousal/Alertness: Awake/alert Behavior During Therapy: WFL for tasks assessed/performed Overall Cognitive Status: Within Functional  Limits for tasks assessed    Balance     End of Session PT - End of Session Activity Tolerance: Patient tolerated treatment well Patient left: in chair;with call bell/phone within reach Nurse Communication: Mobility status   GP     Roney [facility] [place], [place]  10/07/2013, 9:12 AM

## 2013-10-07 NOTE — Discharge Summary (Signed)
Physician Discharge Summary  [name] IRS:[id number] DOB: Apr 05, 1956 DOA: 10/04/2013  PCP: Leonides Grills, MD  Admit date: 10/04/2013 Discharge date: 10/07/2013  Time spent: 45 minutes  Recommendations for Outpatient Follow-up:  -Will be discharged home today with [facility] services. -Will need to follow up with Dr. Mayer Camel and with her PCP in 2 weeks.   Discharge Diagnoses:  Principal Problem:   Intertrochanteric fracture of left femur Active Problems:   Hypertension   Chronic back pain   Peptic ulcer   High cholesterol   Hip fracture   Hip fracture, left   Discharge Condition: Stable and IMproved  There were no vitals filed for this visit.  History of present illness:  58 yo female h/o PUD, htn, hld comes in after having a mechanical fall resulting in left hip fracture. Pain is well controlled right now. Her rubber boot got stuck on a nail sticking out of her ramp walkway, which caused her to trip and fall. Overall pretty healthy, was diagnosed recently with PUD per egd in nov 2014, was placed on a ppi and this issue has much improved, no h/o bleeding from ulcer however. No prior cardiac disease, is very active. Hospitalist admission was requested.   Hospital Course:   Acute Left Hip Fracture  -S/p repair 2/13.  -Management as per ortho.  -Patient will return home with [facility] services.   HTN  -Continue home medications.  -Fair control.   PUD  -Continue protonix.   Procedures:  ORIF left hip   Consultations:  None  Discharge Instructions  Discharge Orders   Future Appointments Provider Department Dept Phone   10/08/2013 9:10 AM [facility] [phone]   Please wear two piece clothing and wear no powder or deodorant. Please arrive 15 minutes early prior to your appointment time.   Future Orders Complete By Expires   Diet - low sodium heart healthy  As directed    Discontinue IV  As directed    Increase activity slowly  As directed     Partial weight bearing  As directed    Questions:     % Body Weight:  25   Laterality:  left   Extremity:  Lower   Partial weight bearing  As directed    Questions:     % Body Weight:  50   Laterality:     Extremity:         Medication List    STOP taking these medications       HYDROcodone-acetaminophen 7.5-325 MG per tablet  Commonly known as:  NORCO      TAKE these medications       aspirin EC 325 MG tablet  Take 1 tablet (325 mg total) by mouth 2 (two) times daily.     fluticasone 50 MCG/ACT nasal spray  Commonly known as:  FLONASE  Place 1 spray into the nose 2 (two) times daily.     furosemide 20 MG tablet  Commonly known as:  LASIX  Take 20 mg by mouth daily.     gemfibrozil 600 MG tablet  Commonly known as:  LOPID  Take 600 mg by mouth 2 (two) times daily before a meal.     LINZESS 290 MCG Caps capsule  Generic drug:  Linaclotide  Take 290 mcg by mouth daily.     lisinopril 20 MG tablet  Commonly known as:  PRINIVIL,ZESTRIL  Take 20 mg by mouth daily.     lovastatin 20 MG tablet  Commonly  known as:  MEVACOR  Take 40 mg by mouth at bedtime.     lubiprostone 24 MCG capsule  Commonly known as:  AMITIZA  Take 1 capsule (24 mcg total) by mouth 2 (two) times daily with a meal.     meloxicam 7.5 MG tablet  Commonly known as:  MOBIC  Take 15 mg by mouth daily.     methocarbamol 500 MG tablet  Commonly known as:  ROBAXIN  Take 1 tablet (500 mg total) by mouth 4 (four) times daily.     methocarbamol 500 MG tablet  Commonly known as:  ROBAXIN  Take 1 tablet (500 mg total) by mouth 2 (two) times daily with a meal.     oxyCODONE-acetaminophen 5-325 MG per tablet  Commonly known as:  ROXICET  Take 1 tablet by mouth every 4 (four) hours as needed.     pantoprazole 40 MG tablet  Commonly known as:  PROTONIX  Take 40 mg by mouth 2 (two) times daily.     Potassium Gluconate 550 MG Tabs  Take 1 tablet by mouth daily. Takes with fluid pill. May take  extra tablet due to cramping in legs.       Allergies  Allergen Reactions  . Clindamycin/Lincomycin Swelling    Swelling feet  . Codeine Itching  . Levaquin [Levofloxacin] Swelling  . Nsaids     Needs to avoid d/t peptic ulcer       Follow-up Information   Follow up with [name], MD In 2 weeks.   Specialty:  Orthopedic Surgery   Contact information:   [place] [address] [phone]       Follow up with [name] (physical therapy and nurse)    Contact information:   [address] [place] [address] [phone]       Follow up with [name], MD In 2 weeks.   Specialty:  Orthopedic Surgery   Contact information:   [place] [address] [phone]       Follow up with Leonides Grills, MD. Schedule an appointment as soon as possible for a visit in 2 weeks.   Specialty:  Family Medicine   Contact information:   [id number] [place] [address] [phone]        The results of significant diagnostics from this hospitalization (including imaging, microbiology, ancillary and laboratory) are listed below for reference.    Significant Diagnostic Studies: Dg Chest 1 View  10/04/2013   CLINICAL DATA:  Low back pain.  Left hip pain.  Status post fall.  EXAM: CHEST - 1 VIEW  COMPARISON:  PA lateral chest 11/22/2012.  FINDINGS: Heart size is upper normal. Lungs are clear. No pneumothorax or pleural effusion.  IMPRESSION: No acute disease.   Electronically Signed   By: Inge Rise M.D.   On: 10/04/2013 15:33   Dg Lumbar Spine Complete  10/04/2013   CLINICAL DATA:  Left hip pain.  Low back pain.  Fall.  Hypertension.  EXAM: LUMBAR SPINE - COMPLETE 4+ VIEW  COMPARISON:  CT ABD - PELV W/ CM dated 03/22/2013  FINDINGS: Lumbar spurring observed with facet arthropathy particularly at L4-5 and L5-S1, and to a lesser extent at L3-4 bilaterally.  Aortoiliac atherosclerosis  noted.  Body habitus reduces diagnostic sensitivity and specificity. There is 3 mm of degenerative grade 1 anterolisthesis at L4-5, similar to the prior CT scan. No fracture identified.  IMPRESSION: 1. Lower lumbar  spondylosis without fracture or acute subluxation. There is 3 mm of degenerative grade 1 anterolisthesis at L4-5, stable from prior exam. 2.  Body habitus reduces diagnostic sensitivity and specificity.   Electronically Signed   By: Sherryl Barters M.D.   On: 10/04/2013 15:33   Dg Hip Complete Left  10/04/2013   CLINICAL DATA:  Status post fall.  Left hip pain.  EXAM: LEFT HIP - COMPLETE 2+ VIEW  COMPARISON:  None.  FINDINGS: The patient has an acute left intertrochanteric fracture. No other acute bony or joint abnormality is identified. No notable degenerative change.  IMPRESSION: Acute left intertrochanteric fracture.   Electronically Signed   By: Inge Rise M.D.   On: 10/04/2013 15:29   Dg Hip Operative Left  10/05/2013   CLINICAL DATA:  Intertrochanteric nail left hip.  EXAM: OPERATIVE LEFT HIP  COMPARISON:  10/04/2013  FINDINGS: Lateral fixation plate over the proximal left femoral diaphysis with associated compression screw bridging the femoral neck into the femoral head as hardware is intact and normally located. There is anatomic alignment about patient's intertrochanteric fracture.  IMPRESSION: Post fixation of intertrochanteric fracture with hardware intact and anatomic alignment about the fracture site.   Electronically Signed   By: Marin Olp M.D.   On: 10/05/2013 15:38   Dg Pelvis Portable  10/05/2013   CLINICAL DATA:  Left femoral fracture  EXAM: PORTABLE PELVIS 1-2 VIEWS  COMPARISON:  10/04/2013  FINDINGS: The fixation sideplate was compression screw is now seen traversing the intertrochanteric fracture. The fracture fragments are in near anatomic alignment. No other focal abnormality is noted.   Electronically Signed   By: Inez Catalina M.D.   On: 10/05/2013 16:22     Microbiology: Recent Results (from the past 240 hour(s))  SURGICAL PCR SCREEN     Status: None   Collection Time    10/05/13  2:05 AM      Result Value Ref Range Status   MRSA, PCR NEGATIVE  NEGATIVE Final   Staphylococcus aureus NEGATIVE  NEGATIVE Final   Comment:            The Xpert SA Assay (FDA     approved for NASAL specimens     in patients over 81 years of age),     is one component of     a comprehensive surveillance     program.  Test performance has     been validated by [employer] for patients greater     than or equal to 44 year old.     It is not intended     to diagnose infection nor to     guide or monitor treatment.     Labs: Basic Metabolic Panel:  Recent Labs Lab 10/04/13 1547 10/05/13 0710 10/06/13 0456 10/07/13 0533  NA 144 140 142 142  K 4.0 3.9 4.2 4.5  CL 101 102 104 105  CO2 28 26 25 25   GLUCOSE 100* 98 108* 113*  BUN 17 19 10 14   CREATININE 0.90 1.06 0.82 1.07  CALCIUM 10.1 9.3 9.2 9.0   Liver Function Tests: No results found for this basename: AST, ALT, ALKPHOS, BILITOT, PROT, ALBUMIN,  in the last 168 hours No results found for this basename: LIPASE, AMYLASE,  in the last 168 hours No results found for this basename: AMMONIA,  in the last 168 hours CBC:  Recent Labs Lab 10/04/13 1547 10/05/13 0710 10/06/13 0456 10/07/13 0533  WBC 13.0* 10.1 12.5* 11.4*  NEUTROABS 9.9*  --   --   --   HGB 14.3 12.4 12.1 10.9*  HCT 43.4 37.7 37.0 33.4*  MCV 91.6 91.3 91.8 92.0  PLT 240 223 222 196   Cardiac Enzymes: No results found for this basename: CKTOTAL, CKMB, CKMBINDEX, TROPONINI,  in the last 168 hours BNP: BNP (last 3 results) No results found for this basename: PROBNP,  in the last 8760 hours CBG:  Recent Labs Lab 10/06/13 0632  GLUCAP 120*       Signed:  Lelon Frohlich  [facility] Pager: [phone] 10/07/2013, 12:41 PM

## 2013-10-07 NOTE — Progress Notes (Signed)
Clinical Education officer, museum (CSW) received consult for SNF placement. Per chart and RN case manager patient is going home with home health services. Please reconsult if further social work needs arise. CSW signing off.   Blima Rich, [place] Weekend CSW [phone]

## 2013-10-08 ENCOUNTER — Inpatient Hospital Stay (HOSPITAL_COMMUNITY): Admission: RE | Admit: 2013-10-08 | Payer: BC Managed Care – PPO | Source: Ambulatory Visit

## 2013-10-08 LAB — TYPE AND SCREEN
ABO/RH(D): A POS
ANTIBODY SCREEN: POSITIVE
DAT, IgG: NEGATIVE
Donor AG Type: NEGATIVE
Donor AG Type: NEGATIVE
Unit division: 0
Unit division: 0

## 2013-10-09 ENCOUNTER — Encounter (HOSPITAL_COMMUNITY): Payer: Self-pay | Admitting: Orthopedic Surgery

## 2013-10-12 ENCOUNTER — Other Ambulatory Visit: Payer: Self-pay | Admitting: Internal Medicine

## 2013-10-12 NOTE — Telephone Encounter (Signed)
She is overdue for 3 month f/u EGD. Was on recall for 08/2013.  Needs OV.  RX done.

## 2013-10-19 ENCOUNTER — Telehealth: Payer: Self-pay | Admitting: Internal Medicine

## 2013-10-19 NOTE — Telephone Encounter (Signed)
Let's call her back in about 3 months

## 2013-10-19 NOTE — Telephone Encounter (Signed)
Reminder in epic °

## 2013-10-19 NOTE — Telephone Encounter (Signed)
Pt received a letter that it was time to follow up with doctor. She has fractured her hip and wants to hold off before making appointment. She said everything was fine and the medications were helping.

## 2013-11-12 ENCOUNTER — Other Ambulatory Visit: Payer: Self-pay | Admitting: Internal Medicine

## 2013-11-12 ENCOUNTER — Other Ambulatory Visit: Payer: Self-pay | Admitting: Gastroenterology

## 2014-02-11 ENCOUNTER — Other Ambulatory Visit: Payer: Self-pay | Admitting: Gastroenterology

## 2014-02-13 ENCOUNTER — Other Ambulatory Visit: Payer: Self-pay | Admitting: Dermatology

## 2014-03-05 ENCOUNTER — Ambulatory Visit (HOSPITAL_COMMUNITY)
Admission: RE | Admit: 2014-03-05 | Discharge: 2014-03-05 | Disposition: A | Discharge status: Home / Self Care | Payer: BC Managed Care – PPO | Source: Ambulatory Visit | Attending: Family Medicine | Admitting: Family Medicine

## 2014-03-05 ENCOUNTER — Other Ambulatory Visit (HOSPITAL_COMMUNITY): Payer: Self-pay | Admitting: Family Medicine

## 2014-03-05 DIAGNOSIS — J449 Chronic obstructive pulmonary disease, unspecified: Secondary | ICD-10-CM

## 2014-03-05 DIAGNOSIS — G8929 Other chronic pain: Secondary | ICD-10-CM

## 2014-03-05 DIAGNOSIS — Z87891 Personal history of nicotine dependence: Secondary | ICD-10-CM | POA: Insufficient documentation

## 2014-03-05 DIAGNOSIS — I10 Essential (primary) hypertension: Secondary | ICD-10-CM

## 2014-03-05 DIAGNOSIS — J4489 Other specified chronic obstructive pulmonary disease: Secondary | ICD-10-CM | POA: Insufficient documentation

## 2014-03-05 DIAGNOSIS — I517 Cardiomegaly: Secondary | ICD-10-CM | POA: Insufficient documentation

## 2014-03-12 ENCOUNTER — Other Ambulatory Visit (HOSPITAL_COMMUNITY): Payer: Self-pay | Admitting: Internal Medicine

## 2014-03-12 ENCOUNTER — Other Ambulatory Visit (HOSPITAL_COMMUNITY): Payer: Self-pay | Admitting: Family Medicine

## 2014-03-12 DIAGNOSIS — Z1231 Encounter for screening mammogram for malignant neoplasm of breast: Secondary | ICD-10-CM

## 2014-03-12 DIAGNOSIS — R51 Headache: Secondary | ICD-10-CM

## 2014-03-12 DIAGNOSIS — R42 Dizziness and giddiness: Secondary | ICD-10-CM

## 2014-03-14 ENCOUNTER — Ambulatory Visit (HOSPITAL_COMMUNITY)
Admission: RE | Admit: 2014-03-14 | Discharge: 2014-03-14 | Disposition: A | Discharge status: Home / Self Care | Payer: BC Managed Care – PPO | Source: Ambulatory Visit | Attending: Internal Medicine | Admitting: Internal Medicine

## 2014-03-14 ENCOUNTER — Ambulatory Visit (HOSPITAL_COMMUNITY)
Admission: RE | Admit: 2014-03-14 | Discharge: 2014-03-14 | Disposition: A | Discharge status: Home / Self Care | Payer: BC Managed Care – PPO | Source: Ambulatory Visit | Attending: Family Medicine | Admitting: Family Medicine

## 2014-03-14 DIAGNOSIS — R42 Dizziness and giddiness: Secondary | ICD-10-CM | POA: Insufficient documentation

## 2014-03-14 DIAGNOSIS — R51 Headache: Secondary | ICD-10-CM | POA: Insufficient documentation

## 2014-03-14 DIAGNOSIS — Z1231 Encounter for screening mammogram for malignant neoplasm of breast: Secondary | ICD-10-CM

## 2014-04-02 DIAGNOSIS — E7800 Pure hypercholesterolemia, unspecified: Secondary | ICD-10-CM | POA: Insufficient documentation

## 2014-04-02 DIAGNOSIS — I209 Angina pectoris, unspecified: Secondary | ICD-10-CM | POA: Insufficient documentation

## 2014-04-22 ENCOUNTER — Telehealth: Payer: Self-pay | Admitting: Internal Medicine

## 2014-04-22 NOTE — Telephone Encounter (Signed)
[place] for the patient to come in and pay her copay on Friday, 9/4

## 2014-04-22 NOTE — Telephone Encounter (Signed)
Patient called inquiring if she can be billed for her copay or return a day after her appointment to pay.  Her appointment is 04/24/14  [phone]

## 2014-04-24 ENCOUNTER — Other Ambulatory Visit: Payer: Self-pay | Admitting: Internal Medicine

## 2014-04-24 ENCOUNTER — Encounter (INDEPENDENT_AMBULATORY_CARE_PROVIDER_SITE_OTHER): Payer: Self-pay

## 2014-04-24 ENCOUNTER — Ambulatory Visit (INDEPENDENT_AMBULATORY_CARE_PROVIDER_SITE_OTHER): Payer: BC Managed Care – PPO | Admitting: Gastroenterology

## 2014-04-24 ENCOUNTER — Encounter: Payer: Self-pay | Admitting: Gastroenterology

## 2014-04-24 VITALS — BP 139/93 | HR 80 | Temp 98.4°F | Ht 66.0 in | Wt 210.0 lb

## 2014-04-24 DIAGNOSIS — K279 Peptic ulcer, site unspecified, unspecified as acute or chronic, without hemorrhage or perforation: Secondary | ICD-10-CM

## 2014-04-24 NOTE — Patient Instructions (Signed)
1. Upper endoscopy with Dr. Gala Romney as scheduled. If you still have an ulcer you will need to stop meloxicam. 2. Continue pantoprazole once daily. 3. Continue Linzess once daily.

## 2014-04-24 NOTE — Assessment & Plan Note (Signed)
58 y/o lady with gastric ulcer in setting of Mobic/ASA last year. Due for surveillance EGD to verify healing. Overall feels better but still some pp bloating. Continues to take Mobic for arthritis.  I have discussed the risks, alternatives, benefits with regards to but not limited to the risk of reaction to medication, bleeding, infection, perforation and the patient is agreeable to proceed. Written consent to be obtained.  Continue Linzess for constipation.

## 2014-04-24 NOTE — Progress Notes (Signed)
Primary Care Physician: Leonides Grills, MD  Primary Gastroenterologist:  Garfield Cornea, MD   Chief Complaint  Patient presents with  . Follow-up    HPI: [name] is a 58 y.o. female here for followup of prepyloric/antral ulcer. Diagnosed back in October 2014. At that time recommended to stop Mobic and take protonix 40 mg twice a day. She had to postpone her appointment back in February because she broke her hip and required surgery. We had plans to consider followup EGD to verify healing at that time. She has 14 pound weight loss since September 2014 but none recent.  Still on Mobic. Taking pantoprazole once daily. Denies pp abdominal pain but does have bloating. Linzess helps with constipation. No melena, brbpr, vomiting, heartburn, dysphagia.   Current Outpatient Prescriptions  Medication Sig Dispense Refill  . aspirin EC 325 MG tablet Take 1 tablet (325 mg total) by mouth 2 (two) times daily.  30 tablet  0  . Calcium Carb-Cholecalciferol (CALCIUM + D3 PO) Take by mouth daily.      . fluticasone (FLONASE) 50 MCG/ACT nasal spray Place 1 spray into the nose 2 (two) times daily.      . furosemide (LASIX) 20 MG tablet Take 20 mg by mouth daily.      Marland Kitchen gemfibrozil (LOPID) 600 MG tablet Take 600 mg by mouth 2 (two) times daily before a meal.      . HYDROcodone-acetaminophen (NORCO/VICODIN) 5-325 MG per tablet Take 1 tablet by mouth 2 (two) times daily as needed.      Marland Kitchen LINZESS 290 MCG CAPS capsule TAKE 1 CAPSULE BY MOUTH DAILY  30 capsule  5  . lisinopril (PRINIVIL,ZESTRIL) 20 MG tablet Take 20 mg by mouth daily.      Marland Kitchen lovastatin (MEVACOR) 20 MG tablet Take 40 mg by mouth at bedtime.       . meloxicam (MOBIC) 7.5 MG tablet Take 7.5 mg by mouth 2 (two) times daily.       . Multiple Vitamins-Minerals (MULTI ADULT GUMMIES PO) Take by mouth.      . pantoprazole (PROTONIX) 40 MG tablet Take 1 tablet (40 mg total) by mouth daily.  30 tablet  5  . Potassium Gluconate 550 MG TABS  Take 1 tablet by mouth daily. Takes with fluid pill. May take extra tablet due to cramping in legs.       No current facility-administered medications for this visit.    Allergies as of 04/24/2014 - Review Complete 04/24/2014  Allergen Reaction Noted  . Clindamycin/lincomycin Swelling 01/29/2013  . Codeine Itching 01/23/2013  . Levaquin [levofloxacin] Swelling 01/29/2013  . Nsaids  06/11/2013   Past Medical History  Diagnosis Date  . Hypertension   . High cholesterol   . Fluid retention   . Hypokalemia   . Chronic back pain   . Arthritis   . Pneumonia     hospitalized in March  . Cancer 1992    cervical  . Diverticulosis   . Peptic ulcer    Past Surgical History  Procedure Laterality Date  . Abdominal hysterectomy    . Cholecystectomy    . Knee surgery      right and left  . Total knee arthroplasty Right 01/29/2013    Procedure: TOTAL KNEE ARTHROPLASTY WITH HARDWARE REMOVAL;  Surgeon: [name], MD;  Location: [facility];  Service: Orthopedics;  Laterality: Right;  DEPUY/SIGMA RP, SYNTHES SCREWDRIVERS  . Total knee arthroplasty Left 06/11/2013    Dr  Rowan  . Colonoscopy with esophagogastroduodenoscopy (egd) N/A 06/08/2013    [id number] antral ulcer status post biopsy (benign). Hiatal hernia/ Tortuous colon. Internal hemorrhoids. Colonic diverticulosis. Single colonic polyp removed as described above (too small to process). Next TCS 05/2023  . Total knee arthroplasty Left 06/11/2013    Procedure: TOTAL KNEE ARTHROPLASTY;  Surgeon: [name], MD;  Location: [facility];  Service: Orthopedics;  Laterality: Left;  . Intramedullary (im) nail intertrochanteric Left 10/05/2013    Procedure: ORIF LEFT HIP  INTERTROCHANTRIC FRACTURE;  Surgeon: [name], MD;  Location: [facility];  Service: Orthopedics;  Laterality: Left;   Family History  Problem Relation Age of Onset  . Ulcers Father   . Colon cancer Neg Hx   . Kidney cancer Neg Hx    History   Social History  . Marital  Status: Widowed    Spouse Name: N/A    Number of Children: N/A  . Years of Education: N/A   Social History Main Topics  . Smoking status: Current Every Day Smoker -- 0.50 packs/day for 43 years    Types: Cigarettes  . Smokeless tobacco: Never Used  . Alcohol Use: Yes     Comment: occasional  . Drug Use: No  . Sexual Activity: None   Other Topics Concern  . None   Social History Narrative  . None    ROS:  General: Negative for anorexia, no recent weight loss, fever, chills, fatigue, weakness. ENT: Negative for hoarseness, difficulty swallowing , nasal congestion. CV: Negative for chest pain, angina, palpitations, dyspnea on exertion, peripheral edema.  Respiratory: Negative for dyspnea at rest, dyspnea on exertion, cough, sputum, wheezing.  GI: See history of present illness. GU:  Negative for dysuria, hematuria, urinary incontinence, urinary frequency, nocturnal urination.  Endo: Negative for unusual weight change.  MS: hip pain s/p surgery for fracture this year.  Physical Examination:   BP 139/93  Pulse 80  Temp(Src) 98.4 F (36.9 C) (Oral)  Ht 5\' 6"  (1.676 m)  Wt 210 lb (95.255 kg)  BMI 33.91 kg/m2  General: Well-nourished, well-developed in no acute distress.  Eyes: No icterus. Mouth: Oropharyngeal mucosa moist and pink , no lesions erythema or exudate. Lungs: Clear to auscultation bilaterally.  Heart: Regular rate and rhythm, no murmurs rubs or gallops.  Abdomen: Bowel sounds are normal, nontender, nondistended, no hepatosplenomegaly or masses, no abdominal bruits or hernia , no rebound or guarding.   Extremities: No lower extremity edema. No clubbing or deformities. Neuro: Alert and oriented x 4   Skin: Warm and dry, no jaundice.   Psych: Alert and cooperative, normal mood and affect.

## 2014-04-24 NOTE — Progress Notes (Signed)
Cc to pcp °

## 2014-05-01 ENCOUNTER — Encounter (HOSPITAL_COMMUNITY): Payer: Self-pay | Admitting: Pharmacy Technician

## 2014-05-10 ENCOUNTER — Other Ambulatory Visit: Payer: Self-pay | Admitting: Gastroenterology

## 2014-05-14 ENCOUNTER — Other Ambulatory Visit: Payer: Self-pay | Admitting: Gastroenterology

## 2014-05-20 ENCOUNTER — Encounter (HOSPITAL_COMMUNITY): Payer: Self-pay

## 2014-05-20 ENCOUNTER — Ambulatory Visit (HOSPITAL_COMMUNITY)
Admission: RE | Admit: 2014-05-20 | Discharge: 2014-05-20 | Disposition: A | Discharge status: Home / Self Care | Payer: BC Managed Care – PPO | Source: Ambulatory Visit | Attending: Internal Medicine | Admitting: Internal Medicine

## 2014-05-20 ENCOUNTER — Encounter (HOSPITAL_COMMUNITY)
Admission: RE | Disposition: A | Discharge status: Home / Self Care | Payer: Self-pay | Source: Ambulatory Visit | Attending: Internal Medicine

## 2014-05-20 DIAGNOSIS — E7800 Pure hypercholesterolemia, unspecified: Secondary | ICD-10-CM | POA: Insufficient documentation

## 2014-05-20 DIAGNOSIS — K257 Chronic gastric ulcer without hemorrhage or perforation: Secondary | ICD-10-CM

## 2014-05-20 DIAGNOSIS — K279 Peptic ulcer, site unspecified, unspecified as acute or chronic, without hemorrhage or perforation: Secondary | ICD-10-CM

## 2014-05-20 DIAGNOSIS — IMO0002 Reserved for concepts with insufficient information to code with codable children: Secondary | ICD-10-CM | POA: Diagnosis not present

## 2014-05-20 DIAGNOSIS — Z09 Encounter for follow-up examination after completed treatment for conditions other than malignant neoplasm: Secondary | ICD-10-CM | POA: Diagnosis not present

## 2014-05-20 DIAGNOSIS — K259 Gastric ulcer, unspecified as acute or chronic, without hemorrhage or perforation: Secondary | ICD-10-CM | POA: Diagnosis present

## 2014-05-20 DIAGNOSIS — Z7982 Long term (current) use of aspirin: Secondary | ICD-10-CM | POA: Insufficient documentation

## 2014-05-20 DIAGNOSIS — F17200 Nicotine dependence, unspecified, uncomplicated: Secondary | ICD-10-CM | POA: Diagnosis not present

## 2014-05-20 DIAGNOSIS — Z79899 Other long term (current) drug therapy: Secondary | ICD-10-CM | POA: Diagnosis not present

## 2014-05-20 DIAGNOSIS — I10 Essential (primary) hypertension: Secondary | ICD-10-CM | POA: Insufficient documentation

## 2014-05-20 HISTORY — PX: ESOPHAGOGASTRODUODENOSCOPY: SHX5428

## 2014-05-20 SURGERY — EGD (ESOPHAGOGASTRODUODENOSCOPY)
Anesthesia: Moderate Sedation

## 2014-05-20 MED ORDER — PROMETHAZINE HCL 25 MG/ML IJ SOLN
INTRAMUSCULAR | Status: AC
  Filled 2014-05-20: qty 1

## 2014-05-20 MED ORDER — SODIUM CHLORIDE 0.9 % IJ SOLN
INTRAMUSCULAR | Status: AC
  Filled 2014-05-20: qty 10

## 2014-05-20 MED ORDER — MIDAZOLAM HCL 2 MG/2ML IJ SOLN
1.0000 mg | Freq: Once | INTRAMUSCULAR | Status: AC
  Administered 2014-05-20: 1 mg via INTRAVENOUS

## 2014-05-20 MED ORDER — MIDAZOLAM HCL 2 MG/2ML IJ SOLN
INTRAMUSCULAR | Status: AC
  Filled 2014-05-20: qty 2

## 2014-05-20 MED ORDER — MIDAZOLAM HCL 5 MG/5ML IJ SOLN
INTRAMUSCULAR | Status: AC
  Filled 2014-05-20: qty 10

## 2014-05-20 MED ORDER — MIDAZOLAM HCL 5 MG/5ML IJ SOLN
INTRAMUSCULAR | Status: DC | PRN
  Administered 2014-05-20 (×2): 2 mg via INTRAVENOUS

## 2014-05-20 MED ORDER — ONDANSETRON HCL 4 MG/2ML IJ SOLN
INTRAMUSCULAR | Status: DC | PRN
  Administered 2014-05-20: 4 mg via INTRAVENOUS

## 2014-05-20 MED ORDER — SIMETHICONE 40 MG/0.6ML PO SUSP
ORAL | Status: DC | PRN
  Administered 2014-05-20: 10:00:00

## 2014-05-20 MED ORDER — SODIUM CHLORIDE 0.9 % IV SOLN
INTRAVENOUS | Status: DC
  Administered 2014-05-20: 08:00:00 via INTRAVENOUS

## 2014-05-20 MED ORDER — LIDOCAINE VISCOUS 2 % MT SOLN
OROMUCOSAL | Status: AC
  Filled 2014-05-20: qty 15

## 2014-05-20 MED ORDER — PROMETHAZINE HCL 25 MG/ML IJ SOLN
25.0000 mg | Freq: Once | INTRAMUSCULAR | Status: AC
  Administered 2014-05-20: 25 mg via INTRAVENOUS

## 2014-05-20 MED ORDER — MEPERIDINE HCL 100 MG/ML IJ SOLN
INTRAMUSCULAR | Status: DC | PRN
  Administered 2014-05-20 (×2): 50 mg via INTRAVENOUS

## 2014-05-20 MED ORDER — LIDOCAINE VISCOUS 2 % MT SOLN
OROMUCOSAL | Status: DC | PRN
  Administered 2014-05-20 (×2): 2 mL via OROMUCOSAL

## 2014-05-20 MED ORDER — ONDANSETRON HCL 4 MG/2ML IJ SOLN
INTRAMUSCULAR | Status: AC
  Filled 2014-05-20: qty 2

## 2014-05-20 MED ORDER — MEPERIDINE HCL 100 MG/ML IJ SOLN
INTRAMUSCULAR | Status: AC
  Filled 2014-05-20: qty 2

## 2014-05-20 NOTE — Progress Notes (Signed)
Phenergan 25 ordered and given.  Patient extremely anxious sitting on side of bed getting up and down blood pressure elevated, shaking and states she is "jittery".  Dr. Gala Romney notified order given for versed 1mg  times 2 doses q 5 minutes in pre op area.  First dose given patient got more restless more agitated. Second dose given same reaction.

## 2014-05-20 NOTE — Op Note (Signed)
[facility] [address] [place], [address]   ENDOSCOPY PROCEDURE REPORT  PATIENT: Alexis, Shah  MR#: [id number] BIRTHDATE: [birth year] , 58  yrs. old GENDER: female ENDOSCOPIST: R.  Garfield Cornea, MD FACP FACG REFERRED BY:  Elsie Lincoln, M.D. PROCEDURE DATE:  05/20/2014 PROCEDURE:  EGD w/ biopsy INDICATIONS:  surveillance.  ; history of gastric ulcer MEDICATIONS: Versed 6 mg IV and Demerol 100 mg IV and Phenergan 25 mg IV and Zofran 4 mg IV.  Xylocaine gel early ASA CLASS:      Class II  CONSENT: The risks, benefits, limitations, alternatives and imponderables have been discussed.  The potential for biopsy, esophogeal dilation, etc. have also been reviewed.  Questions have been answered.  All parties agreeable.  Please see the history and physical in the medical record for more information.  DESCRIPTION OF PROCEDURE: After the risks benefits and alternatives of the procedure were thoroughly explained, informed consent was obtained.  The MW-1027O ([id number]) endoscope was introduced through the mouth and advanced to the second portion of the duodenum , limited by Without limitations. The instrument was slowly withdrawn as the mucosa was fully examined.      STOMACH: A single non-bleeding and shallow ulcer ranging between 3-5 mm in size was found in the gastric antrum.  catheter multiple "satellite" erosions. Small hiatal hernia. No infiltrating process observed. Esophagus normal. Patent pylorus. Normal first and second portion of the duodenum.       Biopsies of the persisting gastric ulcer taken for histologic study.   The scope was then withdrawn from the patient and the procedure completed.  COMPLICATIONS: There were no immediate complications.  ENDOSCOPIC IMPRESSION: Single ulcer ranging between 3-5 mm in size was found in the gastric antrum   With satellite erosions. Status post ulcer biopsy. Likely NSAID effect.  RECOMMENDATIONS: Await  biopsy results    Minimize use of NSAIDs in the future. Continue Protonix 40 mg twice daily  REPEAT EXAM:  eSigned:  R. Garfield Cornea, MD FACP [facility] 05/20/2014 10:45 AM    CC:

## 2014-05-20 NOTE — Discharge Instructions (Addendum)
EGD Discharge instructions Please read the instructions outlined below and refer to this sheet in the next few weeks. These discharge instructions provide you with general information on caring for yourself after you leave the hospital. Your doctor may also give you specific instructions. While your treatment has been planned according to the most current medical practices available, unavoidable complications occasionally occur. If you have any problems or questions after discharge, please call your doctor. ACTIVITY  You may resume your regular activity but move at a slower pace for the next 24 hours.   Take frequent rest periods for the next 24 hours.   Walking will help expel (get rid of) the air and reduce the bloated feeling in your abdomen.   No driving for 24 hours (because of the anesthesia (medicine) used during the test).   You may shower.   Do not sign any important legal documents or operate any machinery for 24 hours (because of the anesthesia used during the test).  NUTRITION  Drink plenty of fluids.   You may resume your normal diet.   Begin with a light meal and progress to your normal diet.   Avoid alcoholic beverages for 24 hours or as instructed by your caregiver.  MEDICATIONS  You may resume your normal medications unless your caregiver tells you otherwise.  WHAT YOU CAN EXPECT TODAY  You may experience abdominal discomfort such as a feeling of fullness or gas pains.  FOLLOW-UP  Your doctor will discuss the results of your test with you.  SEEK IMMEDIATE MEDICAL ATTENTION IF ANY OF THE FOLLOWING OCCUR:  Excessive nausea (feeling sick to your stomach) and/or vomiting.   Severe abdominal pain and distention (swelling).   Trouble swallowing.   Temperature over 101 F (37.8 C).   Rectal bleeding or vomiting of blood.   Information on peptic ulcer disease provided  Continue Protonix 40 mg twice daily  Be best for your stomach did come off nonsteroidal  drugs like Mobic  Further recommendations to follow.   Peptic Ulcer A peptic ulcer is a sore in the lining of your esophagus (esophageal ulcer), stomach (gastric ulcer), or in the first part of your small intestine (duodenal ulcer). The ulcer causes erosion into the deeper tissue. CAUSES  Normally, the lining of the stomach and the small intestine protects itself from the acid that digests food. The protective lining can be damaged by:  An infection caused by a bacterium called Helicobacter pylori (H. pylori).  Regular use of nonsteroidal anti-inflammatory drugs (NSAIDs), such as ibuprofen or aspirin.  Smoking tobacco. Other risk factors include being older than 5, drinking alcohol excessively, and having a family history of ulcer disease.  SYMPTOMS   Burning pain or gnawing in the area between the chest and the belly button.  Heartburn.  Nausea and vomiting.  Bloating. The pain can be worse on an empty stomach and at night. If the ulcer results in bleeding, it can cause:  Black, tarry stools.  Vomiting of bright red blood.  Vomiting of coffee-ground-looking materials. DIAGNOSIS  A diagnosis is usually made based upon your history and an exam. Other tests and procedures may be performed to find the cause of the ulcer. Finding a cause will help determine the best treatment. Tests and procedures may include:  Blood tests, stool tests, or breath tests to check for the bacterium H. pylori.  An upper gastrointestinal (GI) series of the esophagus, stomach, and small intestine.  An endoscopy to examine the esophagus, stomach, and small  intestine.  A biopsy. TREATMENT  Treatment may include:  Eliminating the cause of the ulcer, such as smoking, NSAIDs, or alcohol.  Medicines to reduce the amount of acid in your digestive tract.  Antibiotic medicines if the ulcer is caused by the H. pylori bacterium.  An upper endoscopy to treat a bleeding ulcer.  Surgery if the bleeding  is severe or if the ulcer created a hole somewhere in the digestive system. HOME CARE INSTRUCTIONS   Avoid tobacco, alcohol, and caffeine. Smoking can increase the acid in the stomach, and continued smoking will impair the healing of ulcers.  Avoid foods and drinks that seem to cause discomfort or aggravate your ulcer.  Only take medicines as directed by your caregiver. Do not substitute over-the-counter medicines for prescription medicines without talking to your caregiver.  Keep any follow-up appointments and tests as directed. SEEK MEDICAL CARE IF:   Your do not improve within 7 days of starting treatment.  You have ongoing indigestion or heartburn. SEEK IMMEDIATE MEDICAL CARE IF:   You have sudden, sharp, or persistent abdominal pain.  You have bloody or dark black, tarry stools.  You vomit blood or vomit that looks like coffee grounds.  You become light-headed, weak, or feel faint.  You become sweaty or clammy. MAKE SURE YOU:   Understand these instructions.  Will watch your condition.  Will get help right away if you are not doing well or get worse. Document Released: 08/06/2000 Document Revised: 12/24/2013 Document Reviewed: 03/08/2012 [facility] Patient Information 2015 [place], [place]. This information is not intended to replace advice given to you by your health care provider. Make sure you discuss any questions you have with your health care provider.

## 2014-05-21 NOTE — H&P (View-Only) (Signed)
Primary Care Physician: Leonides Grills, MD  Primary Gastroenterologist:  Garfield Cornea, MD   Chief Complaint  Patient presents with  . Follow-up    HPI: [name] is a 58 y.o. female here for followup of prepyloric/antral ulcer. Diagnosed back in October 2014. At that time recommended to stop Mobic and take protonix 40 mg twice a day. She had to postpone her appointment back in February because she broke her hip and required surgery. We had plans to consider followup EGD to verify healing at that time. She has 14 pound weight loss since September 2014 but none recent.  Still on Mobic. Taking pantoprazole once daily. Denies pp abdominal pain but does have bloating. Linzess helps with constipation. No melena, brbpr, vomiting, heartburn, dysphagia.   Current Outpatient Prescriptions  Medication Sig Dispense Refill  . aspirin EC 325 MG tablet Take 1 tablet (325 mg total) by mouth 2 (two) times daily.  30 tablet  0  . Calcium Carb-Cholecalciferol (CALCIUM + D3 PO) Take by mouth daily.      . fluticasone (FLONASE) 50 MCG/ACT nasal spray Place 1 spray into the nose 2 (two) times daily.      . furosemide (LASIX) 20 MG tablet Take 20 mg by mouth daily.      Marland Kitchen gemfibrozil (LOPID) 600 MG tablet Take 600 mg by mouth 2 (two) times daily before a meal.      . HYDROcodone-acetaminophen (NORCO/VICODIN) 5-325 MG per tablet Take 1 tablet by mouth 2 (two) times daily as needed.      Marland Kitchen LINZESS 290 MCG CAPS capsule TAKE 1 CAPSULE BY MOUTH DAILY  30 capsule  5  . lisinopril (PRINIVIL,ZESTRIL) 20 MG tablet Take 20 mg by mouth daily.      Marland Kitchen lovastatin (MEVACOR) 20 MG tablet Take 40 mg by mouth at bedtime.       . meloxicam (MOBIC) 7.5 MG tablet Take 7.5 mg by mouth 2 (two) times daily.       . Multiple Vitamins-Minerals (MULTI ADULT GUMMIES PO) Take by mouth.      . pantoprazole (PROTONIX) 40 MG tablet Take 1 tablet (40 mg total) by mouth daily.  30 tablet  5  . Potassium Gluconate 550 MG TABS  Take 1 tablet by mouth daily. Takes with fluid pill. May take extra tablet due to cramping in legs.       No current facility-administered medications for this visit.    Allergies as of 04/24/2014 - Review Complete 04/24/2014  Allergen Reaction Noted  . Clindamycin/lincomycin Swelling 01/29/2013  . Codeine Itching 01/23/2013  . Levaquin [levofloxacin] Swelling 01/29/2013  . Nsaids  06/11/2013   Past Medical History  Diagnosis Date  . Hypertension   . High cholesterol   . Fluid retention   . Hypokalemia   . Chronic back pain   . Arthritis   . Pneumonia     hospitalized in March  . Cancer 1992    cervical  . Diverticulosis   . Peptic ulcer    Past Surgical History  Procedure Laterality Date  . Abdominal hysterectomy    . Cholecystectomy    . Knee surgery      right and left  . Total knee arthroplasty Right 01/29/2013    Procedure: TOTAL KNEE ARTHROPLASTY WITH HARDWARE REMOVAL;  Surgeon: [name], MD;  Location: [place];  Service: Orthopedics;  Laterality: Right;  DEPUY/SIGMA RP, SYNTHES SCREWDRIVERS  . Total knee arthroplasty Left 06/11/2013    Dr  Rowan  . Colonoscopy with esophagogastroduodenoscopy (egd) N/A 06/08/2013    [id number] antral ulcer status post biopsy (benign). Hiatal hernia/ Tortuous colon. Internal hemorrhoids. Colonic diverticulosis. Single colonic polyp removed as described above (too small to process). Next TCS 05/2023  . Total knee arthroplasty Left 06/11/2013    Procedure: TOTAL KNEE ARTHROPLASTY;  Surgeon: [name], MD;  Location: [place];  Service: Orthopedics;  Laterality: Left;  . Intramedullary (im) nail intertrochanteric Left 10/05/2013    Procedure: ORIF LEFT HIP  INTERTROCHANTRIC FRACTURE;  Surgeon: [name], MD;  Location: [place];  Service: Orthopedics;  Laterality: Left;   Family History  Problem Relation Age of Onset  . Ulcers Father   . Colon cancer Neg Hx   . Kidney cancer Neg Hx    History   Social History  . Marital  Status: Widowed    Spouse Name: N/A    Number of Children: N/A  . Years of Education: N/A   Social History Main Topics  . Smoking status: Current Every Day Smoker -- 0.50 packs/day for 43 years    Types: Cigarettes  . Smokeless tobacco: Never Used  . Alcohol Use: Yes     Comment: occasional  . Drug Use: No  . Sexual Activity: None   Other Topics Concern  . None   Social History Narrative  . None    ROS:  General: Negative for anorexia, no recent weight loss, fever, chills, fatigue, weakness. ENT: Negative for hoarseness, difficulty swallowing , nasal congestion. CV: Negative for chest pain, angina, palpitations, dyspnea on exertion, peripheral edema.  Respiratory: Negative for dyspnea at rest, dyspnea on exertion, cough, sputum, wheezing.  GI: See history of present illness. GU:  Negative for dysuria, hematuria, urinary incontinence, urinary frequency, nocturnal urination.  Endo: Negative for unusual weight change.  MS: hip pain s/p surgery for fracture this year.  Physical Examination:   BP 139/93  Pulse 80  Temp(Src) 98.4 F (36.9 C) (Oral)  Ht 5\' 6"  (1.676 m)  Wt 210 lb (95.255 kg)  BMI 33.91 kg/m2  General: Well-nourished, well-developed in no acute distress.  Eyes: No icterus. Mouth: Oropharyngeal mucosa moist and pink , no lesions erythema or exudate. Lungs: Clear to auscultation bilaterally.  Heart: Regular rate and rhythm, no murmurs rubs or gallops.  Abdomen: Bowel sounds are normal, nontender, nondistended, no hepatosplenomegaly or masses, no abdominal bruits or hernia , no rebound or guarding.   Extremities: No lower extremity edema. No clubbing or deformities. Neuro: Alert and oriented x 4   Skin: Warm and dry, no jaundice.   Psych: Alert and cooperative, normal mood and affect.

## 2014-05-21 NOTE — Interval H&P Note (Signed)
History and Physical Interval Note:  05/21/2014 10:03 AM  [name]  has presented today for surgery, with the diagnosis of PUD  The various methods of treatment have been discussed with the patient and family. After consideration of risks, benefits and other options for treatment, the patient has consented to  Procedure(s) with comments: ESOPHAGOGASTRODUODENOSCOPY (EGD) (N/A) - 9:30 as a surgical intervention .  The patient's history has been reviewed, patient examined, no change in status, stable for surgery.  I have reviewed the patient's chart and labs.  Questions were answered to the patient's satisfaction.     Manus Rudd

## 2014-05-22 ENCOUNTER — Encounter (HOSPITAL_COMMUNITY): Payer: Self-pay | Admitting: Internal Medicine

## 2014-05-25 ENCOUNTER — Encounter: Payer: Self-pay | Admitting: Internal Medicine

## 2014-05-27 ENCOUNTER — Encounter: Payer: Self-pay | Admitting: Internal Medicine

## 2014-06-10 ENCOUNTER — Other Ambulatory Visit: Payer: Self-pay | Admitting: Gastroenterology

## 2014-06-10 NOTE — Telephone Encounter (Signed)
I thought she was on Linzess, not Amitiza. Please verify.

## 2014-06-20 ENCOUNTER — Encounter: Payer: Self-pay | Admitting: Internal Medicine

## 2014-07-17 ENCOUNTER — Other Ambulatory Visit: Payer: Self-pay | Admitting: Dermatology

## 2014-08-05 ENCOUNTER — Ambulatory Visit (HOSPITAL_COMMUNITY): Payer: Self-pay | Admitting: Psychology

## 2014-08-09 ENCOUNTER — Other Ambulatory Visit: Payer: Self-pay | Admitting: Gastroenterology

## 2014-08-12 ENCOUNTER — Other Ambulatory Visit: Payer: Self-pay | Admitting: Gastroenterology

## 2014-08-28 ENCOUNTER — Ambulatory Visit: Payer: Self-pay | Admitting: Nurse Practitioner

## 2014-08-28 ENCOUNTER — Ambulatory Visit: Payer: BC Managed Care – PPO | Admitting: Gastroenterology

## 2014-10-01 ENCOUNTER — Other Ambulatory Visit: Payer: Self-pay | Admitting: Orthopedic Surgery

## 2014-10-01 DIAGNOSIS — M25552 Pain in left hip: Secondary | ICD-10-CM

## 2014-10-02 ENCOUNTER — Other Ambulatory Visit (HOSPITAL_COMMUNITY): Payer: Self-pay | Admitting: Orthopedic Surgery

## 2014-10-02 DIAGNOSIS — M25552 Pain in left hip: Secondary | ICD-10-CM

## 2014-10-03 ENCOUNTER — Other Ambulatory Visit: Payer: Self-pay

## 2014-10-03 ENCOUNTER — Ambulatory Visit (HOSPITAL_COMMUNITY)
Admission: RE | Admit: 2014-10-03 | Discharge: 2014-10-03 | Disposition: A | Discharge status: Home / Self Care | Payer: 59 | Source: Ambulatory Visit | Attending: Orthopedic Surgery | Admitting: Orthopedic Surgery

## 2014-10-03 DIAGNOSIS — Z96653 Presence of artificial knee joint, bilateral: Secondary | ICD-10-CM | POA: Diagnosis not present

## 2014-10-03 DIAGNOSIS — M25552 Pain in left hip: Secondary | ICD-10-CM | POA: Diagnosis not present

## 2014-10-17 ENCOUNTER — Other Ambulatory Visit (HOSPITAL_COMMUNITY): Payer: Self-pay | Admitting: Orthopedic Surgery

## 2014-10-17 DIAGNOSIS — Z96649 Presence of unspecified artificial hip joint: Secondary | ICD-10-CM

## 2014-10-17 DIAGNOSIS — T84038A Mechanical loosening of other internal prosthetic joint, initial encounter: Secondary | ICD-10-CM

## 2014-10-17 DIAGNOSIS — M25552 Pain in left hip: Secondary | ICD-10-CM

## 2014-10-21 ENCOUNTER — Encounter (HOSPITAL_COMMUNITY): Payer: Self-pay

## 2014-10-21 ENCOUNTER — Encounter (HOSPITAL_COMMUNITY)
Admission: RE | Admit: 2014-10-21 | Discharge: 2014-10-21 | Disposition: A | Discharge status: Home / Self Care | Payer: 59 | Source: Ambulatory Visit | Attending: Orthopedic Surgery | Admitting: Orthopedic Surgery

## 2014-10-21 DIAGNOSIS — T84038A Mechanical loosening of other internal prosthetic joint, initial encounter: Secondary | ICD-10-CM | POA: Insufficient documentation

## 2014-10-21 DIAGNOSIS — M25552 Pain in left hip: Secondary | ICD-10-CM | POA: Diagnosis present

## 2014-10-21 DIAGNOSIS — Z96649 Presence of unspecified artificial hip joint: Secondary | ICD-10-CM

## 2014-10-21 MED ORDER — TECHNETIUM TC 99M MEDRONATE IV KIT
25.0000 | PACK | Freq: Once | INTRAVENOUS | Status: AC | PRN
  Administered 2014-10-21: 25 via INTRAVENOUS

## 2014-11-14 ENCOUNTER — Other Ambulatory Visit (HOSPITAL_COMMUNITY): Payer: Self-pay | Admitting: Family Medicine

## 2014-11-14 DIAGNOSIS — Z1231 Encounter for screening mammogram for malignant neoplasm of breast: Secondary | ICD-10-CM

## 2014-11-29 ENCOUNTER — Other Ambulatory Visit (HOSPITAL_COMMUNITY): Payer: Self-pay | Admitting: Family Medicine

## 2014-11-29 DIAGNOSIS — R0989 Other specified symptoms and signs involving the circulatory and respiratory systems: Secondary | ICD-10-CM

## 2014-12-02 ENCOUNTER — Ambulatory Visit (HOSPITAL_COMMUNITY)
Admission: RE | Admit: 2014-12-02 | Discharge: 2014-12-02 | Disposition: A | Discharge status: Home / Self Care | Payer: 59 | Source: Ambulatory Visit | Attending: Family Medicine | Admitting: Family Medicine

## 2014-12-02 DIAGNOSIS — H539 Unspecified visual disturbance: Secondary | ICD-10-CM | POA: Insufficient documentation

## 2014-12-02 DIAGNOSIS — R0989 Other specified symptoms and signs involving the circulatory and respiratory systems: Secondary | ICD-10-CM

## 2014-12-02 DIAGNOSIS — I10 Essential (primary) hypertension: Secondary | ICD-10-CM | POA: Diagnosis not present

## 2014-12-02 DIAGNOSIS — E785 Hyperlipidemia, unspecified: Secondary | ICD-10-CM | POA: Insufficient documentation

## 2014-12-02 DIAGNOSIS — Z87891 Personal history of nicotine dependence: Secondary | ICD-10-CM | POA: Insufficient documentation

## 2015-03-10 ENCOUNTER — Ambulatory Visit (INDEPENDENT_AMBULATORY_CARE_PROVIDER_SITE_OTHER): Payer: 59 | Admitting: Neurology

## 2015-03-10 ENCOUNTER — Encounter: Payer: Self-pay | Admitting: Neurology

## 2015-03-10 VITALS — BP 175/87 | HR 65 | Ht 66.0 in | Wt 222.8 lb

## 2015-03-10 DIAGNOSIS — G609 Hereditary and idiopathic neuropathy, unspecified: Secondary | ICD-10-CM | POA: Diagnosis not present

## 2015-03-10 MED ORDER — GABAPENTIN 300 MG PO CAPS: 300.0000 mg | ORAL_CAPSULE | Freq: Three times a day (TID) | ORAL | Status: DC

## 2015-03-10 NOTE — Progress Notes (Signed)
[facility]    Provider:  Dr Jaynee Eagles Referring Provider: Jake Samples, PA* Primary Care Physician:  Jana Half  CC:  Peripheral neuropathy  HPI:  [name] is a 59 y.o. female here as a referral from Dr. Glennon Mac for peripheral neuropathy. PMHx HTN, HLD.   Several years ago started having numbness in the toes and tingling on the top part of the toes. Symptoms have progressed to the ankles.She was diagnosed with neuropathy and started on neurontin. .It is very painful, can get up to a 10/10. Neurontin doesn't help. It is throbbing and can be sharp. Worse at night. 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 She takes pain medication for her feet.  No inciting factors for the neuropathy, she was not taking any new medications at the time the symptoms starts, never had chemotherapy, no long-term antibiotics, or  any meds that can cause neuropathy or exposure to toxins. She was on the same medications as she is now when the neuropathy started. No family history of neuropathy. No Hx of significant alcohol use. No kidney or liver disease. Her neuropathy is slowly progressing especially within the last 2 years. Her toes are so numb that when her toe peels and she doesn't even know it. She didn't even feel skin being pulled off her toe by the carpet. Symptoms worsening. She has a lot of cramping in the feet, wakes her up at night with charlie horses.  No radicular symptoms. No balance problems due to the numbness. No weakness. But she limps after a while, after 3 hours on her feet they hurt a lot. She has lost 60 pounds. No symptoms in the hands.   Reviewed notes, labs and imaging from outside physicians, which showed:   CT of the head 02/2014 showed No acute intracranial abnormalities including mass lesion or mass effect, hydrocephalus, extra-axial fluid collection, midline shift, hemorrhage, or acute infarction,  large ischemic events (personally reviewed images)  Review of Systems: Patient complains of symptoms per HPI as well as the following symptoms: swelling in legs, snoring, constipation, joint pain, joint swelling, cramps, aching muscles, numbness, sleepiness, snoring, restless legs, not enough sleep. Pertinent negatives per HPI. All others negative.   History   Social History  . Marital Status: Widowed    Spouse Name: N/A  . Number of Children: 3  . 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 Smoking status: Former Smoker -- 0.50 packs/day for 43 years    Types: Cigarettes    Quit date: 09/09/2014  . Smokeless tobacco: Never Used  . Alcohol Use: 0.0 oz/week    0 Standard drinks or equivalent per week     Comment: One glass twice weekly.  . Drug Use: No  . Sexual Activity: Not on file   Other Topics Concern  . Not on file   Social History Narrative   Lives at home with her brother.   Right-handed.   1-2 cups caffeine daily.    Family History  Problem Relation Age of Onset  . Ulcers Father   . Prostate cancer Father   . Kidney cancer Father   . Hypertension Father   . Parkinson's disease Father   . Hypercholesterolemia Father   . Hypotension Mother   . Hypercholesterolemia Mother   . Lung cancer Mother   . Bone cancer  Mother   . Heart disease Father     Past Medical History  Diagnosis Date  . Hypertension   . High cholesterol   . Fluid retention   . Hypokalemia   . Chronic back pain   . Arthritis   . Pneumonia     hospitalized in March  . Cancer 1992    cervical  . Diverticulosis   . Peptic ulcer   . Peripheral neuropathy     Past Surgical History  Procedure Laterality Date  . Abdominal hysterectomy    . Cholecystectomy    . Knee surgery      right and left  . Total knee arthroplasty Right 01/29/2013    Procedure: TOTAL KNEE ARTHROPLASTY WITH HARDWARE REMOVAL;  Surgeon: [name], MD;   Location: [place];  Service: Orthopedics;  Laterality: Right;  DEPUY/SIGMA RP, SYNTHES SCREWDRIVERS  . Total knee arthroplasty Left 06/11/2013    Dr Mayer Camel  . Colonoscopy with esophagogastroduodenoscopy (egd) N/A 06/08/2013    [id number] antral ulcer status post biopsy (benign). Hiatal hernia/ Tortuous colon. Internal hemorrhoids. Colonic diverticulosis. Single colonic polyp removed as described above (too small to process). Next TCS 05/2023  . Total knee arthroplasty Left 06/11/2013    Procedure: TOTAL KNEE ARTHROPLASTY;  Surgeon: [name], MD;  Location: [place];  Service: Orthopedics;  Laterality: Left;  . Intramedullary (im) nail intertrochanteric Left 10/05/2013    Procedure: ORIF LEFT HIP  INTERTROCHANTRIC FRACTURE;  Surgeon: [name], MD;  Location: [place];  Service: Orthopedics;  Laterality: Left;  . Esophagogastroduodenoscopy N/A 05/20/2014    FYT:[id number] ulcer ranging between 3-73mm in size was found in the gastric antrum with satellite erosions. Status post ulcer body. Likely NSAID effect.    Current Outpatient Prescriptions  Medication Sig Dispense Refill  . amitriptyline (ELAVIL) 25 MG tablet     . aspirin 81 MG tablet Take 81 mg by mouth daily.    . fluticasone (FLONASE) 50 MCG/ACT nasal spray Place 1 spray into the nose 2 (two) times daily.    . furosemide (LASIX) 20 MG tablet Take 20 mg by mouth daily.    Marland Kitchen gabapentin (NEURONTIN) 100 MG capsule 3 (three) times daily.    Marland Kitchen HYDROcodone-acetaminophen (NORCO/VICODIN) 5-325 MG per tablet Take 1 tablet by mouth 2 (two) times daily as needed.    Marland Kitchen LINZESS 290 MCG CAPS capsule TAKE 1 CAPSULE BY MOUTH DAILY 30 capsule 5  . lovastatin (MEVACOR) 20 MG tablet Take 40 mg by mouth at bedtime.     . Multiple Vitamins-Minerals (MULTI ADULT GUMMIES PO) Take by mouth.    . pantoprazole (PROTONIX) 40 MG tablet TAKE 1 TABLET BY MOUTH EVERY DAY 30 tablet 11  . valsartan (DIOVAN) 160 MG tablet      No current facility-administered  medications for this visit.    Allergies as of 03/10/2015 - Review Complete 03/10/2015  Allergen Reaction Noted  . Clindamycin/lincomycin Swelling 01/29/2013  . Codeine Itching 01/23/2013  . Levaquin [levofloxacin] Swelling 01/29/2013  . Nsaids  06/11/2013    Vitals: BP 175/87 mmHg  Pulse 65  Ht 5\' 6"  (1.676 m)  Wt 222 lb 12.8 oz (101.061 kg)  BMI 35.98 kg/m2 Last Weight:  Wt Readings from Last 1 Encounters:  03/10/15 222 lb 12.8 oz (101.061 kg)   Last Height:   Ht Readings from Last 1 Encounters:  03/10/15 5\' 6"  (1.676 m)   Physical exam: Exam: Gen: NAD, conversant, well nourised,  CV: RRR, no MRG. No Carotid Bruits. L> R peripheral edema, warm, nontender Eyes: Conjunctivae clear without exudates or hemorrhage  Neuro: Detailed Neurologic Exam  Speech:    Speech is normal; fluent and spontaneous with normal comprehension.  Cognition:    The patient is oriented to person, place, and time;     recent and remote memory intact;     language fluent;     normal attention, concentration,     fund of knowledge Cranial Nerves:    The pupils are equal, round, and reactive to light. The fundi are flat. Visual fields are full to finger confrontation. Extraocular movements are intact. Trigeminal sensation is intact and the muscles of mastication are normal. The face is symmetric. The palate elevates in the midline. Hearing intact. Voice is normal. Shoulder shrug is normal. The tongue has normal motion without fasciculations.   Coordination:    Normal finger to nose and heel to shin.  Gait:    Heel-toe are normal.   Motor Observation:    No asymmetry, no atrophy, and no involuntary movements noted. Tone:    Normal muscle tone.    Posture:    Posture is normal. normal erect    Strength:    Strength is V/V in the upper and lower limbs.      Sensation: pp and temp dec to mid calf Absent vibration in the toes Absent proprioception in the  toes +romberg       Reflex Exam:  DTR's: Absent lowers (knee replacements)     Toes:    The toes are downgoing bilaterally.   Clonus:    Clonus is absent.       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 Will order a complete serum neuropathy panel. Will also perform an emg/ncs.   Sarina Ill, MD  [facility] [address] [place], [place] [address]  Phone [phone] Fax [phone]

## 2015-03-10 NOTE — Patient Instructions (Signed)
Remember to drink plenty of fluid, eat healthy meals and do not skip any meals. Try to eat protein with a every meal and eat a healthy snack such as fruit or nuts in between meals. Try to keep a regular sleep-wake schedule and try to exercise daily, particularly in the form of walking, 20-30 minutes a day, if you can.   As far as your medications are concerned, I would like to suggest: Increase Neurontin to 300mg  three times a day  As far as diagnostic testing: EMG/NCS, labs  I would like to see you back in 4 months, sooner if we need to. Please call us with any interim questions, concerns, problems, updates or refill requests.   Please also call us for any test results so we can go over those with you on the phone.  My clinical assistant and will answer any of your questions and relay your messages to me and also relay most of my messages to you.   Our phone number is [phone]. We also have an after hours call service for urgent matters and there is a physician on-call for urgent questions. For any emergencies you know to call 911 or go to the nearest emergency room

## 2015-03-12 LAB — PAN-ANCA
ANCA Proteinase 3: <3.5 U/mL (ref 0.0–3.5)
Atypical pANCA: <1:20 {titer}
C-ANCA: <1:20 {titer}
Myeloperoxidase Ab: <9 U/mL (ref 0.0–9.0)

## 2015-03-12 LAB — TSH: TSH: 1.51 u[IU]/mL (ref 0.450–4.500)

## 2015-03-12 LAB — HEAVY METALS, BLOOD
ARSENIC: 4 ug/L (ref 2–23)
LEAD, BLOOD: NOT DETECTED ug/dL (ref 0–19)
Mercury: NOT DETECTED ug/L (ref 0.0–14.9)

## 2015-03-12 LAB — ANGIOTENSIN CONVERTING ENZYME: ANGIO CONVERT ENZYME: 15 U/L (ref 14–82)

## 2015-03-12 LAB — MULTIPLE MYELOMA PANEL, SERUM
ALBUMIN/GLOB SERPL: 1.2 (ref 0.7–1.7)
Albumin SerPl Elph-Mcnc: 3.7 g/dL (ref 2.9–4.4)
Alpha 1: 0.3 g/dL (ref 0.0–0.4)
Alpha2 Glob SerPl Elph-Mcnc: 0.9 g/dL (ref 0.4–1.0)
B-Globulin SerPl Elph-Mcnc: 1.1 g/dL (ref 0.7–1.3)
GLOBULIN, TOTAL: 3.2 g/dL (ref 2.2–3.9)
Gamma Glob SerPl Elph-Mcnc: 0.8 g/dL (ref 0.4–1.8)
IGG (IMMUNOGLOBIN G), SERUM: 723 mg/dL (ref 700–1600)
IgA/Immunoglobulin A, Serum: 82 mg/dL — ABNORMAL LOW (ref 87–352)
IgM (Immunoglobulin M), Srm: 169 mg/dL (ref 26–217)
Total Protein: 6.9 g/dL (ref 6.0–8.5)

## 2015-03-12 LAB — HEMOGLOBIN A1C
Est. average glucose Bld gHb Est-mCnc: 123 mg/dL
Hgb A1c MFr Bld: 5.9 % — ABNORMAL HIGH (ref 4.8–5.6)

## 2015-03-12 LAB — VITAMIN B6: Vitamin B6: 29.7 ug/L (ref 2.0–32.8)

## 2015-03-12 LAB — ANA: ANA Titer 1: NEGATIVE

## 2015-03-12 LAB — B. BURGDORFI ANTIBODIES

## 2015-03-12 LAB — RHEUMATOID FACTOR: Rhuematoid fact SerPl-aCnc: 10 IU/mL (ref 0.0–13.9)

## 2015-03-12 LAB — METHYLMALONIC ACID, SERUM: METHYLMALONIC ACID: 141 nmol/L (ref 0–378)

## 2015-03-12 LAB — GLIADIN ANTIBODIES, SERUM
ANTIGLIADIN ABS, IGA: 2 U (ref 0–19)
Gliadin IgG: 2 units (ref 0–19)

## 2015-03-12 LAB — HEPATITIS C ANTIBODY: Hep C Virus Ab: <0.1 s/co ratio (ref 0.0–0.9)

## 2015-03-12 LAB — VITAMIN B1: Thiamine: 145 nmol/L (ref 66.5–200.0)

## 2015-03-12 LAB — RPR: RPR Ser Ql: NONREACTIVE

## 2015-03-12 LAB — ANA W/REFLEX: ANA: NEGATIVE

## 2015-03-12 LAB — TISSUE TRANSGLUTAMINASE, IGA

## 2015-03-12 LAB — SEDIMENTATION RATE: Sed Rate: 15 mm/hr (ref 0–40)

## 2015-03-12 LAB — B12 AND FOLATE PANEL
Folate: 18.1 ng/mL (ref >3.0)
VITAMIN B 12: 395 pg/mL (ref 211–946)

## 2015-03-12 LAB — HIV ANTIBODY (ROUTINE TESTING W REFLEX): HIV Screen 4th Generation wRfx: NONREACTIVE

## 2015-03-17 ENCOUNTER — Telehealth: Payer: Self-pay | Admitting: *Deleted

## 2015-03-17 ENCOUNTER — Encounter: Payer: Self-pay | Admitting: *Deleted

## 2015-03-17 ENCOUNTER — Ambulatory Visit (HOSPITAL_COMMUNITY)
Admission: RE | Admit: 2015-03-17 | Discharge: 2015-03-17 | Disposition: A | Discharge status: Home / Self Care | Payer: 59 | Source: Ambulatory Visit | Attending: Family Medicine | Admitting: Family Medicine

## 2015-03-17 DIAGNOSIS — Z1231 Encounter for screening mammogram for malignant neoplasm of breast: Secondary | ICD-10-CM | POA: Diagnosis not present

## 2015-03-17 NOTE — Telephone Encounter (Signed)
Spoke w/ pt to let her know lab work was okay except for slightly elevated HgbA1c. At increased risk for developing diabetes and this has been associated with peripheral neuropathy. Dr. Jaynee Eagles would like to suggest weight loss and diet changes and recheck with pcp. Told her HgbA1c was 5.9. Normal being 4.8-5.6. She wanted to let Dr. Jaynee Eagles know that the gabapentin is helping her feet a lot and she is taking 2 pills 3x/day.  I mailed copied of labs to pt per pt request to bring to PCP.

## 2015-03-31 ENCOUNTER — Encounter: Payer: 59 | Admitting: Neurology

## 2015-08-06 ENCOUNTER — Other Ambulatory Visit: Payer: Self-pay | Admitting: Nurse Practitioner

## 2015-09-05 ENCOUNTER — Other Ambulatory Visit: Payer: Self-pay | Admitting: Nurse Practitioner

## 2015-10-07 ENCOUNTER — Other Ambulatory Visit: Payer: Self-pay

## 2015-10-07 ENCOUNTER — Ambulatory Visit (INDEPENDENT_AMBULATORY_CARE_PROVIDER_SITE_OTHER): Payer: BLUE CROSS/BLUE SHIELD | Admitting: Gastroenterology

## 2015-10-07 ENCOUNTER — Encounter: Payer: Self-pay | Admitting: Gastroenterology

## 2015-10-07 VITALS — BP 180/108 | HR 73 | Temp 96.5°F | Ht 66.0 in | Wt 237.0 lb

## 2015-10-07 DIAGNOSIS — K5900 Constipation, unspecified: Secondary | ICD-10-CM

## 2015-10-07 DIAGNOSIS — K279 Peptic ulcer, site unspecified, unspecified as acute or chronic, without hemorrhage or perforation: Secondary | ICD-10-CM

## 2015-10-07 DIAGNOSIS — K5909 Other constipation: Secondary | ICD-10-CM

## 2015-10-07 MED ORDER — PANTOPRAZOLE SODIUM 40 MG PO TBEC: 40.0000 mg | DELAYED_RELEASE_TABLET | Freq: Every day | ORAL | Status: DC

## 2015-10-07 MED ORDER — NALOXEGOL OXALATE 25 MG PO TABS: 25.0000 mg | ORAL_TABLET | Freq: Every day | ORAL | Status: DC

## 2015-10-07 NOTE — Progress Notes (Signed)
Primary Care Physician: Jana Half  Primary Gastroenterologist:  Garfield Cornea, MD   Chief Complaint  Patient presents with  . medications is not helping    HPI: [name] is a 60 y.o. female here for follow up. She was last seen in 04/2014 for EGD surveillance for history of gastric ulcer. She had a single nonbleeding and shallow ulcer ranging from 3-5 mm in size in the gastric antrum, multiple satellite erosions. Pathology was benign. She was supposed come back for three-month follow-up did not keep the appointment.  She has been maintained on pantoprazole 40 mg daily. In the near future she's going to an informational session regarding gastric bypass. Over the past 7 months she has been on hydrocodone 3 daily for left hip pain and neuropathy. She notes that her Linzess is no longer working. He had been working fine prior to adding narcotics. Recently took 2 bottles of magnesium citrate did not have a bowel movement. Previously failed Amitiza. She has used MiraLAX in addition to her Linzess recently with some results. Previously failed MiraLAX as a single agent however. Over the last few months she has woken up almost every morning with the hiccups. Some mild upper abdominal discomfort relieved with belching. She wonders if this may be related to her gastric ulcer. She reports being off of NSAIDs. She quit smoking 30 days ago. Denies melena or rectal bleeding.     Current Outpatient Prescriptions  Medication Sig Dispense Refill  . amitriptyline (ELAVIL) 25 MG tablet     . aspirin 81 MG tablet Take 81 mg by mouth daily.    Marland Kitchen docusate sodium (COLACE) 100 MG capsule Take 100 mg by mouth daily.    . fluticasone (FLONASE) 50 MCG/ACT nasal spray Place 1 spray into the nose 2 (two) times daily.    . furosemide (LASIX) 20 MG tablet Take 20 mg by mouth daily.    Marland Kitchen gabapentin (NEURONTIN) 300 MG capsule Take 1 capsule (300 mg total) by mouth 3 (three) times daily. 90 capsule  11  . gemfibrozil (LOPID) 600 MG tablet Take 600 mg by mouth 2 (two) times daily before a meal.    . HYDROcodone-acetaminophen (NORCO/VICODIN) 5-325 MG per tablet Take 1 tablet by mouth 2 (two) times daily as needed.    Marland Kitchen lisinopril (PRINIVIL,ZESTRIL) 20 MG tablet Take by mouth.    . lovastatin (MEVACOR) 20 MG tablet Take 40 mg by mouth at bedtime.     . Multiple Vitamins-Minerals (MULTI ADULT GUMMIES PO) Take by mouth.    . pantoprazole (PROTONIX) 40 MG tablet take 1 tablet by mouth once daily 30 tablet 11  . valsartan (DIOVAN) 160 MG tablet     . varenicline (CHANTIX) 1 MG tablet Take 1 mg by mouth 2 (two) times daily.     No current facility-administered medications for this visit.    Allergies as of 10/07/2015 - Review Complete 10/07/2015  Allergen Reaction Noted  . Clindamycin/lincomycin Swelling 01/29/2013  . Codeine Itching 01/23/2013  . Levaquin [levofloxacin] Swelling 01/29/2013  . Nsaids  06/11/2013    ROS:  General: Negative for anorexia, weight loss, fever, chills, fatigue, weakness. ENT: Negative for hoarseness, difficulty swallowing , nasal congestion. CV: Negative for chest pain, angina, palpitations, dyspnea on exertion, peripheral edema.  Respiratory: Negative for dyspnea at rest, dyspnea on exertion, cough, sputum, wheezing.  GI: See history of present illness. GU:  Negative for dysuria, hematuria, urinary incontinence, urinary frequency, nocturnal urination.  Endo: Negative for  unusual weight change.    Physical Examination:   BP 198/105 mmHg  Pulse 73  Temp(Src) 96.5 F (35.8 C)  Ht 5\' 6"  (1.676 m)  Wt 237 lb (107.502 kg)  BMI 38.27 kg/m2  General: Well-nourished, well-developed in no acute distress.  Eyes: No icterus. Mouth: Oropharyngeal mucosa moist and pink , no lesions erythema or exudate. Lungs: Clear to auscultation bilaterally.  Heart: Regular rate and rhythm, no murmurs rubs or gallops.  Abdomen: Bowel sounds are normal, minimal lower  abdominal tenderness, nondistended, no hepatosplenomegaly or masses, no abdominal bruits or hernia , no rebound or guarding.   Extremities: No lower extremity edema. No clubbing or deformities. Neuro: Alert and oriented x 4   Skin: Warm and dry, no jaundice.   Psych: Alert and cooperative, normal mood and affect.  Labs:  Lab Results  Component Value Date   TSH 1.510 03/10/2015   TTG normal. IgA 82, normal 87-352  No results found for: ALT, AST, GGT, ALKPHOS, BILITOT   Imaging Studies: No results found.

## 2015-10-07 NOTE — Patient Instructions (Signed)
1. Start Movantik for opiod-induced constipation. Take one daily 1 hour before a meal.  2. Call in one week and let me know if Movantik is working. If not, we may add back Linzess. 3. Call if hiccups and upper abdominal pain continue once your bowels are moving better. 4. Return to the office in 3 months or sooner if needed.

## 2015-10-08 NOTE — Assessment & Plan Note (Signed)
Likely worsened in the setting of narcotics. Continue Linzess and add Movantik 25mg  daily. Call in one week with progress report. If hiccups do not settle down with adequate management of constipation, would consider further work up. She has known h/o gastric ulcer, may require repeat EGD.   Patient did not like use of phenergan for augmentation of conscious sedation. Made her feel very funny. She will need future endoscopies in the OR with deep sedation.

## 2015-10-09 NOTE — Progress Notes (Signed)
cc'ed to pcp °

## 2015-10-14 ENCOUNTER — Telehealth: Payer: Self-pay | Admitting: Internal Medicine

## 2015-10-14 NOTE — Telephone Encounter (Signed)
Pt saw LSL on 2/14 and was told to call us if the movanic ? Didn't help her. She said that it hasn't helped her and asked about being put back on Linzess. She uses [facility]. [id number]

## 2015-10-15 NOTE — Telephone Encounter (Signed)
Spoke with the pt, she said she is having a small bm daily but doesn't feel like she is having a complete bm. She said LSL said she might could take movantik and linzess. She said her stools were still hard. Please advise.

## 2015-10-15 NOTE — Telephone Encounter (Signed)
Yes, let's add back Linzess 280mcg daily on empty stomach. Continue Movantik as well.

## 2015-10-16 MED ORDER — LINACLOTIDE 290 MCG PO CAPS: 290.0000 ug | ORAL_CAPSULE | Freq: Every day | ORAL | Status: AC

## 2015-10-16 NOTE — Addendum Note (Signed)
Addended by: Claudina Lick on: 10/16/2015 12:32 PM   Modules accepted: Orders

## 2015-10-16 NOTE — Telephone Encounter (Signed)
Pt aware. She needs new rx for linzess sent in. Per LSL ok to send in linzess 212mcg #30 with 11rf  rx has been sent in to the pharmacy.

## 2015-11-03 ENCOUNTER — Other Ambulatory Visit (HOSPITAL_COMMUNITY): Payer: Self-pay | Admitting: General Surgery

## 2015-11-14 ENCOUNTER — Ambulatory Visit (HOSPITAL_COMMUNITY)
Admission: RE | Admit: 2015-11-14 | Discharge: 2015-11-14 | Disposition: A | Discharge status: Home / Self Care | Payer: BLUE CROSS/BLUE SHIELD | Source: Ambulatory Visit | Attending: General Surgery | Admitting: General Surgery

## 2015-11-14 ENCOUNTER — Other Ambulatory Visit (HOSPITAL_COMMUNITY): Payer: Self-pay | Admitting: General Surgery

## 2015-11-14 ENCOUNTER — Other Ambulatory Visit: Payer: Self-pay

## 2015-11-14 DIAGNOSIS — Z8711 Personal history of peptic ulcer disease: Secondary | ICD-10-CM | POA: Insufficient documentation

## 2015-11-14 DIAGNOSIS — I440 Atrioventricular block, first degree: Secondary | ICD-10-CM | POA: Insufficient documentation

## 2015-11-14 DIAGNOSIS — K449 Diaphragmatic hernia without obstruction or gangrene: Secondary | ICD-10-CM | POA: Insufficient documentation

## 2015-11-14 DIAGNOSIS — K219 Gastro-esophageal reflux disease without esophagitis: Secondary | ICD-10-CM | POA: Insufficient documentation

## 2015-12-01 ENCOUNTER — Other Ambulatory Visit: Payer: Self-pay | Admitting: Gastroenterology

## 2015-12-01 ENCOUNTER — Ambulatory Visit: Payer: Self-pay | Admitting: Dietician

## 2015-12-16 ENCOUNTER — Encounter: Payer: Self-pay | Admitting: Dietician

## 2015-12-16 ENCOUNTER — Encounter: Payer: BLUE CROSS/BLUE SHIELD | Attending: General Surgery | Admitting: Dietician

## 2015-12-16 DIAGNOSIS — I10 Essential (primary) hypertension: Secondary | ICD-10-CM | POA: Insufficient documentation

## 2015-12-16 DIAGNOSIS — Z6839 Body mass index (BMI) 39.0-39.9, adult: Secondary | ICD-10-CM | POA: Insufficient documentation

## 2015-12-16 NOTE — Patient Instructions (Signed)
Follow Pre-Op Goals Try Protein Shakes Call [facility] at [phone] when surgery is scheduled to enroll in Pre-Op Class  Things to remember:  Please always be honest with [place]. We want to support you!  If you have any questions or concerns in between appointments, please call or email Ferol Luz, or Margarita Grizzle.  The diet after surgery will be high protein and low in carbohydrate.  Vitamins and calcium need to be taken for the rest of your life.  Feel free to include support people in any classes or appointments.   Supplement recommendations:  "Complete" Multivitamin: Sleeve Gastrectomy and RYGB patients take a double dose of MVI. Vitamin must be liquid or chewable but not gummy. Examples of these include Flintstones Complete and Centrum Complete. If the vitamin is bariatric-specific, take 1 dose as it is already formulated for bariatric surgery patients. Examples of these are Bariatric Advantage, Celebrate, and [employer]. These can be found at the [facility] and/or online.     Calcium citrate: 1500 mg/day of Calcium citrate (also chewable or liquid) is recommended for all procedures. The body is only able to absorb 500-600 mg of Calcium at one time so 3 daily doses of 500 mg are recommended. Calcium doses must be taken a minimum of 2 hours apart. Additionally, Calcium must be taken 2 hours apart from iron-containing MVI. Examples of brands include Celebrate, Bariatric Advantage, and Wellesse. These brands must be purchased online or at the [facility]. Citracal Petites is the only Calcium citrate supplement found in general grocery stores and pharmacies. This is in tablet form and may be recommended for patients who do not tolerate chewable Calcium.  Continued or added Vitamin D supplementation based on individual needs.    Vitamin B12: 300-500 mcg/day for Sleeve Gastrectomy and RYGB. Must be taken intramuscularly, sublingually, or inhaled nasally. Oral route  is not recommended.

## 2015-12-16 NOTE — Progress Notes (Signed)
  Pre-Op Assessment Visit:  Pre-Operative Sleeve Gastrectomy Surgery  Medical Nutrition Therapy:  Appt start time: [id number]   End time:  1205.  Patient was seen on 12/16/2015 for Pre-Operative Nutrition Assessment. Assessment and letter of approval faxed to [facility] Bariatric Surgery Program coordinator on 12/16/2015.   Preferred Learning Style:   No preference indicated   Learning Readiness:   Ready  Handouts given during visit include:  Pre-Op Goals Bariatric Surgery Protein Shakes   During the appointment today the following Pre-Op Goals were reviewed with the patient: Maintain or lose weight as instructed by your surgeon Make healthy food choices Begin to limit portion sizes Limited concentrated sugars and fried foods Keep fat/sugar in the single digits per serving on   food labels Practice CHEWING your food  (aim for 30 chews per bite or until applesauce consistency) Practice not drinking 15 minutes before, during, and 30 minutes after each meal/snack Avoid all carbonated beverages  Avoid/limit caffeinated beverages  Avoid all sugar-sweetened beverages Consume 3 meals per day; eat every 3-5 hours Make a list of non-food related activities Aim for 64-100 ounces of FLUID daily  Aim for at least 60-80 grams of PROTEIN daily Look for a liquid protein source that contain ?15 g protein and ?5 g carbohydrate  (ex: shakes, drinks, shots)  Demonstrated degree of understanding via:  Teach Back  Teaching Method Utilized:  Visual Auditory Hands on  Barriers to learning/adherence to lifestyle change: none  [facility] to [facility] the [facility] to enroll in Pre-Op and Post-Op Nutrition Education when surgery date is scheduled.

## 2016-01-09 ENCOUNTER — Other Ambulatory Visit (HOSPITAL_COMMUNITY): Payer: Self-pay | Admitting: Family Medicine

## 2016-01-09 ENCOUNTER — Ambulatory Visit: Payer: Self-pay | Admitting: Nutrition

## 2016-01-09 DIAGNOSIS — Z1231 Encounter for screening mammogram for malignant neoplasm of breast: Secondary | ICD-10-CM

## 2016-01-15 ENCOUNTER — Ambulatory Visit (HOSPITAL_COMMUNITY): Payer: Self-pay

## 2016-01-30 ENCOUNTER — Other Ambulatory Visit: Payer: Self-pay | Admitting: Gastroenterology

## 2016-03-18 ENCOUNTER — Other Ambulatory Visit (HOSPITAL_COMMUNITY): Payer: Self-pay | Admitting: Orthopedic Surgery

## 2016-03-18 DIAGNOSIS — Z96652 Presence of left artificial knee joint: Secondary | ICD-10-CM

## 2016-03-18 DIAGNOSIS — T84038A Mechanical loosening of other internal prosthetic joint, initial encounter: Secondary | ICD-10-CM

## 2016-03-18 DIAGNOSIS — Z96659 Presence of unspecified artificial knee joint: Secondary | ICD-10-CM

## 2016-03-22 ENCOUNTER — Encounter (HOSPITAL_COMMUNITY)
Admission: RE | Admit: 2016-03-22 | Discharge: 2016-03-22 | Disposition: A | Discharge status: Home / Self Care | Payer: BLUE CROSS/BLUE SHIELD | Source: Ambulatory Visit | Attending: Orthopedic Surgery | Admitting: Orthopedic Surgery

## 2016-03-22 ENCOUNTER — Encounter (HOSPITAL_COMMUNITY): Payer: Self-pay

## 2016-03-22 ENCOUNTER — Ambulatory Visit (HOSPITAL_COMMUNITY): Payer: Self-pay

## 2016-03-22 ENCOUNTER — Ambulatory Visit (HOSPITAL_COMMUNITY)
Admission: RE | Admit: 2016-03-22 | Discharge: 2016-03-22 | Disposition: A | Discharge status: Home / Self Care | Payer: BLUE CROSS/BLUE SHIELD | Source: Ambulatory Visit | Attending: Orthopedic Surgery | Admitting: Orthopedic Surgery

## 2016-03-22 ENCOUNTER — Ambulatory Visit (HOSPITAL_COMMUNITY)
Admission: RE | Admit: 2016-03-22 | Discharge: 2016-03-22 | Disposition: A | Discharge status: Home / Self Care | Payer: BLUE CROSS/BLUE SHIELD | Source: Ambulatory Visit | Attending: Family Medicine | Admitting: Family Medicine

## 2016-03-22 DIAGNOSIS — Z1231 Encounter for screening mammogram for malignant neoplasm of breast: Secondary | ICD-10-CM | POA: Diagnosis present

## 2016-03-22 DIAGNOSIS — Z96652 Presence of left artificial knee joint: Secondary | ICD-10-CM | POA: Insufficient documentation

## 2016-03-22 DIAGNOSIS — T84038A Mechanical loosening of other internal prosthetic joint, initial encounter: Secondary | ICD-10-CM | POA: Insufficient documentation

## 2016-03-22 DIAGNOSIS — Z96659 Presence of unspecified artificial knee joint: Secondary | ICD-10-CM

## 2016-03-22 MED ORDER — TECHNETIUM TC 99M MEDRONATE IV KIT
20.0000 | PACK | Freq: Once | INTRAVENOUS | Status: AC | PRN
  Administered 2016-03-22: 21 via INTRAVENOUS

## 2016-05-27 ENCOUNTER — Ambulatory Visit: Payer: Self-pay | Admitting: General Surgery

## 2016-06-10 ENCOUNTER — Ambulatory Visit: Payer: Self-pay | Admitting: General Surgery

## 2016-06-14 ENCOUNTER — Ambulatory Visit: Payer: Self-pay

## 2016-07-05 ENCOUNTER — Encounter: Payer: BLUE CROSS/BLUE SHIELD | Attending: General Surgery | Admitting: Dietician

## 2016-07-05 DIAGNOSIS — Z713 Dietary counseling and surveillance: Secondary | ICD-10-CM | POA: Diagnosis not present

## 2016-07-05 DIAGNOSIS — Z6841 Body Mass Index (BMI) 40.0 and over, adult: Secondary | ICD-10-CM | POA: Insufficient documentation

## 2016-07-06 NOTE — Progress Notes (Signed)
  Pre-Operative Nutrition Class:  Appt start time: 3383   End time:  1830.  Patient was seen on 07/05/2016 for Pre-Operative Bariatric Surgery Education at the [facility].   Surgery date: 07/19/2016 Surgery type: sleeve gastrectomy Start weight at [facility]: 254 lbs on 12/16/2015 Weight today: 255.6 lbs  TANITA  BODY COMP RESULTS  07/05/16   BMI (kg/m^2) 41.3   Fat Mass (lbs) 122.2   Fat Free Mass (lbs) 133.4   Total Body Water (lbs) 97.2   Samples given per MNT protocol. Patient educated on appropriate usage: Bariatric Advantage Multivitamin (mixed fruit - qty 1) Lot #: [id number] Exp: 06/2017  Premier protein shake (strawberry - qty 1) Lot #: [id number] Exp: 04/2017  Celebrate Vitamins Calcium Citrate (Cafe Mocha - qty 1) Lot #: [id number]  Exp: 11/2017  The following the learning objectives were met by the patient during this course:  Identify Pre-Op Dietary Goals and will begin 2 weeks pre-operatively  Identify appropriate sources of fluids and proteins   State protein recommendations and appropriate sources pre and post-operatively  Identify Post-Operative Dietary Goals and will follow for 2 weeks post-operatively  Identify appropriate multivitamin and calcium sources  Describe the need for physical activity post-operatively and will follow MD recommendations  State when to call healthcare provider regarding medication questions or post-operative complications  Handouts given during class include:  Pre-Op Bariatric Surgery Diet Handout  Protein Shake Handout  Post-Op Bariatric Surgery Nutrition Handout  BELT Program Information Flyer  Support Group Information Flyer  [facility] Bariatric Supplements Price List  Follow-Up Plan: Patient will follow-up at [facility] 2 weeks post operatively for diet advancement per MD.

## 2016-07-12 ENCOUNTER — Encounter (HOSPITAL_COMMUNITY): Payer: Self-pay

## 2016-07-12 NOTE — Patient Instructions (Signed)
[name]  07/12/2016   Your procedure is scheduled on: 07/19/16    Report to [facility]  Entrance take [place]  elevators to 3rd floor to  [place] at    [place] AM.  Call this number if you have problems the morning of surgery [phone]   Remember: ONLY 1 PERSON MAY GO WITH YOU TO SHORT STAY TO GET  READY MORNING OF YOUR SURGERY.  Do not eat food or drink liquids :After Midnight.     Take these medicines the morning of surgery with A SIP OF WATER: Albuterol Inhaler if needed and bring,Flonase, Hydrocodone if needed, Gabapentin, Prtonix, Linzess                                You may not have any metal on your body including hair pins and              piercings  Do not wear jewelry, make-up, lotions, powders or perfumes, deodorant             Do not wear nail polish.  Do not shave  48 hours prior to surgery.                Do not bring valuables to the hospital. [place].  Contacts, dentures or bridgework may not be worn into surgery.  Leave suitcase in the car. After surgery it may be brought to your room.       Special Instructions: N/A              Please read over the following fact sheets you were given: _____________________________________________________________________             [facility] - Preparing for Surgery Before surgery, you can play an important role.  Because skin is not sterile, your skin needs to be as free of germs as possible.  You can reduce the number of germs on your skin by washing with CHG (chlorahexidine gluconate) soap before surgery.  CHG is an antiseptic cleaner which kills germs and bonds with the skin to continue killing germs even after washing. Please DO NOT use if you have an allergy to CHG or antibacterial soaps.  If your skin becomes reddened/irritated stop using the CHG and inform your nurse when you arrive at Short Stay. Do not shave  (including legs and underarms) for at least 48 hours prior to the first CHG shower.  You may shave your face/neck. Please follow these instructions carefully:  1.  Shower with CHG Soap the night before surgery and the  morning of Surgery.  2.  If you choose to wash your hair, wash your hair first as usual with your  normal  shampoo.  3.  After you shampoo, rinse your hair and body thoroughly to remove the  shampoo.                           4.  Use CHG as you would any other liquid soap.  You can apply chg directly  to the skin and wash  Gently with a scrungie or clean washcloth.  5.  Apply the CHG Soap to your body ONLY FROM THE NECK DOWN.   Do not use on face/ open                           Wound or open sores. Avoid contact with eyes, ears mouth and genitals (private parts).                       Wash face,  Genitals (private parts) with your normal soap.             6.  Wash thoroughly, paying special attention to the area where your surgery  will be performed.  7.  Thoroughly rinse your body with warm water from the neck down.  8.  DO NOT shower/wash with your normal soap after using and rinsing off  the CHG Soap.                9.  Pat yourself dry with a clean towel.            10.  Wear clean pajamas.            11.  Place clean sheets on your bed the night of your first shower and do not  sleep with pets. Day of Surgery : Do not apply any lotions/deodorants the morning of surgery.  Please wear clean clothes to the hospital/surgery center.  FAILURE TO FOLLOW THESE INSTRUCTIONS MAY RESULT IN THE CANCELLATION OF YOUR SURGERY PATIENT SIGNATURE_________________________________  NURSE SIGNATURE__________________________________  ________________________________________________________________________

## 2016-07-13 ENCOUNTER — Encounter (HOSPITAL_COMMUNITY): Payer: Self-pay

## 2016-07-13 ENCOUNTER — Encounter (HOSPITAL_COMMUNITY)
Admission: RE | Admit: 2016-07-13 | Discharge: 2016-07-13 | Disposition: A | Discharge status: Home / Self Care | Payer: BLUE CROSS/BLUE SHIELD | Source: Ambulatory Visit | Attending: General Surgery | Admitting: General Surgery

## 2016-07-13 DIAGNOSIS — E669 Obesity, unspecified: Secondary | ICD-10-CM | POA: Insufficient documentation

## 2016-07-13 DIAGNOSIS — Z01812 Encounter for preprocedural laboratory examination: Secondary | ICD-10-CM | POA: Insufficient documentation

## 2016-07-13 DIAGNOSIS — Z6841 Body Mass Index (BMI) 40.0 and over, adult: Secondary | ICD-10-CM | POA: Insufficient documentation

## 2016-07-13 HISTORY — DX: Gastro-esophageal reflux disease without esophagitis: K21.9

## 2016-07-13 HISTORY — DX: Chronic obstructive pulmonary disease, unspecified: J44.9

## 2016-07-13 HISTORY — DX: Prediabetes: R73.03

## 2016-07-13 HISTORY — DX: Other specified postprocedural states: Z98.890

## 2016-07-13 HISTORY — DX: Other specified postprocedural states: R11.2

## 2016-07-13 LAB — COMPREHENSIVE METABOLIC PANEL
ALBUMIN: 5 g/dL (ref 3.5–5.0)
ALK PHOS: 82 U/L (ref 38–126)
ALT: 24 U/L (ref 14–54)
ANION GAP: 9 (ref 5–15)
AST: 23 U/L (ref 15–41)
BUN: 25 mg/dL — ABNORMAL HIGH (ref 6–20)
CALCIUM: 10.3 mg/dL (ref 8.9–10.3)
CHLORIDE: 105 mmol/L (ref 101–111)
CO2: 29 mmol/L (ref 22–32)
Creatinine, Ser: 0.82 mg/dL (ref 0.44–1.00)
GFR calc Af Amer: >60 mL/min (ref >60)
GFR calc non Af Amer: >60 mL/min (ref >60)
GLUCOSE: 101 mg/dL — AB (ref 65–99)
Potassium: 4.5 mmol/L (ref 3.5–5.1)
SODIUM: 143 mmol/L (ref 135–145)
Total Bilirubin: 0.8 mg/dL (ref 0.3–1.2)
Total Protein: 7.6 g/dL (ref 6.5–8.1)

## 2016-07-13 LAB — CBC WITH DIFFERENTIAL/PLATELET
BASOS PCT: 0 %
Basophils Absolute: 0 10*3/uL (ref 0.0–0.1)
EOS ABS: 0.2 10*3/uL (ref 0.0–0.7)
EOS PCT: 2 %
HCT: 38.9 % (ref 36.0–46.0)
HEMOGLOBIN: 13.3 g/dL (ref 12.0–15.0)
Lymphocytes Relative: 36 %
Lymphs Abs: 2.7 10*3/uL (ref 0.7–4.0)
MCH: 30.9 pg (ref 26.0–34.0)
MCHC: 34.2 g/dL (ref 30.0–36.0)
MCV: 90.5 fL (ref 78.0–100.0)
Monocytes Absolute: 0.6 10*3/uL (ref 0.1–1.0)
Monocytes Relative: 7 %
NEUTROS PCT: 55 %
Neutro Abs: 4.1 10*3/uL (ref 1.7–7.7)
PLATELETS: 213 10*3/uL (ref 150–400)
RBC: 4.3 MIL/uL (ref 3.87–5.11)
RDW: 12.8 % (ref 11.5–15.5)
WBC: 7.5 10*3/uL (ref 4.0–10.5)

## 2016-07-13 NOTE — Progress Notes (Signed)
CMP done 07/13/16 faxed via EPIC to Dr Kieth Brightly.

## 2016-07-13 NOTE — Progress Notes (Signed)
EKG-11/14/15- epic along with CXR in epic

## 2016-07-14 LAB — HEMOGLOBIN A1C
Hgb A1c MFr Bld: 5.9 % — ABNORMAL HIGH (ref 4.8–5.6)
Mean Plasma Glucose: 123 mg/dL

## 2016-07-19 ENCOUNTER — Encounter (HOSPITAL_COMMUNITY)
Admission: RE | Disposition: A | Discharge status: Home / Self Care | Payer: Self-pay | Source: Ambulatory Visit | Attending: General Surgery

## 2016-07-19 ENCOUNTER — Encounter (HOSPITAL_COMMUNITY): Payer: Self-pay | Admitting: *Deleted

## 2016-07-19 ENCOUNTER — Inpatient Hospital Stay (HOSPITAL_COMMUNITY): Payer: BLUE CROSS/BLUE SHIELD | Admitting: Registered Nurse

## 2016-07-19 ENCOUNTER — Inpatient Hospital Stay (HOSPITAL_COMMUNITY)
Admission: RE | Admit: 2016-07-19 | Discharge: 2016-07-20 | DRG: 621 | Disposition: A | Discharge status: Home / Self Care | Payer: BLUE CROSS/BLUE SHIELD | Source: Ambulatory Visit | Attending: General Surgery | Admitting: General Surgery

## 2016-07-19 DIAGNOSIS — Z886 Allergy status to analgesic agent status: Secondary | ICD-10-CM

## 2016-07-19 DIAGNOSIS — E669 Obesity, unspecified: Secondary | ICD-10-CM | POA: Diagnosis present

## 2016-07-19 DIAGNOSIS — E7800 Pure hypercholesterolemia, unspecified: Secondary | ICD-10-CM | POA: Diagnosis present

## 2016-07-19 DIAGNOSIS — Z8349 Family history of other endocrine, nutritional and metabolic diseases: Secondary | ICD-10-CM | POA: Diagnosis not present

## 2016-07-19 DIAGNOSIS — Z7982 Long term (current) use of aspirin: Secondary | ICD-10-CM | POA: Diagnosis not present

## 2016-07-19 DIAGNOSIS — Z87891 Personal history of nicotine dependence: Secondary | ICD-10-CM

## 2016-07-19 DIAGNOSIS — R51 Headache: Secondary | ICD-10-CM | POA: Diagnosis not present

## 2016-07-19 DIAGNOSIS — K449 Diaphragmatic hernia without obstruction or gangrene: Secondary | ICD-10-CM | POA: Diagnosis present

## 2016-07-19 DIAGNOSIS — Z79891 Long term (current) use of opiate analgesic: Secondary | ICD-10-CM | POA: Diagnosis not present

## 2016-07-19 DIAGNOSIS — G629 Polyneuropathy, unspecified: Secondary | ICD-10-CM | POA: Diagnosis present

## 2016-07-19 DIAGNOSIS — E1142 Type 2 diabetes mellitus with diabetic polyneuropathy: Secondary | ICD-10-CM | POA: Diagnosis present

## 2016-07-19 DIAGNOSIS — Z96653 Presence of artificial knee joint, bilateral: Secondary | ICD-10-CM | POA: Diagnosis present

## 2016-07-19 DIAGNOSIS — J449 Chronic obstructive pulmonary disease, unspecified: Secondary | ICD-10-CM | POA: Diagnosis present

## 2016-07-19 DIAGNOSIS — Z79899 Other long term (current) drug therapy: Secondary | ICD-10-CM

## 2016-07-19 DIAGNOSIS — K5730 Diverticulosis of large intestine without perforation or abscess without bleeding: Secondary | ICD-10-CM | POA: Diagnosis present

## 2016-07-19 DIAGNOSIS — Z881 Allergy status to other antibiotic agents status: Secondary | ICD-10-CM

## 2016-07-19 DIAGNOSIS — M1990 Unspecified osteoarthritis, unspecified site: Secondary | ICD-10-CM | POA: Diagnosis present

## 2016-07-19 DIAGNOSIS — Z6839 Body mass index (BMI) 39.0-39.9, adult: Secondary | ICD-10-CM

## 2016-07-19 DIAGNOSIS — G8929 Other chronic pain: Secondary | ICD-10-CM | POA: Diagnosis present

## 2016-07-19 DIAGNOSIS — I10 Essential (primary) hypertension: Secondary | ICD-10-CM | POA: Diagnosis present

## 2016-07-19 DIAGNOSIS — K219 Gastro-esophageal reflux disease without esophagitis: Secondary | ICD-10-CM | POA: Diagnosis present

## 2016-07-19 DIAGNOSIS — Z8711 Personal history of peptic ulcer disease: Secondary | ICD-10-CM

## 2016-07-19 DIAGNOSIS — M549 Dorsalgia, unspecified: Secondary | ICD-10-CM | POA: Diagnosis present

## 2016-07-19 HISTORY — PX: LAPAROSCOPIC GASTRIC SLEEVE RESECTION WITH HIATAL HERNIA REPAIR: SHX6512

## 2016-07-19 LAB — HEMOGLOBIN AND HEMATOCRIT, BLOOD
HEMATOCRIT: 42.3 % (ref 36.0–46.0)
HEMOGLOBIN: 13.8 g/dL (ref 12.0–15.0)

## 2016-07-19 LAB — CBC
HCT: 40.9 % (ref 36.0–46.0)
Hemoglobin: 13.7 g/dL (ref 12.0–15.0)
MCH: 30.6 pg (ref 26.0–34.0)
MCHC: 33.5 g/dL (ref 30.0–36.0)
MCV: 91.3 fL (ref 78.0–100.0)
Platelets: 197 10*3/uL (ref 150–400)
RBC: 4.48 MIL/uL (ref 3.87–5.11)
RDW: 12.9 % (ref 11.5–15.5)
WBC: 10.9 10*3/uL — ABNORMAL HIGH (ref 4.0–10.5)

## 2016-07-19 LAB — CREATININE, SERUM
CREATININE: 0.96 mg/dL (ref 0.44–1.00)
GFR calc Af Amer: >60 mL/min (ref >60)

## 2016-07-19 SURGERY — GASTRECTOMY, SLEEVE, LAPAROSCOPIC, WITH HIATAL HERNIA REPAIR
Anesthesia: General | Site: Abdomen

## 2016-07-19 MED ORDER — DEXAMETHASONE SODIUM PHOSPHATE 10 MG/ML IJ SOLN
INTRAMUSCULAR | Status: DC | PRN
  Administered 2016-07-19: 10 mg via INTRAVENOUS

## 2016-07-19 MED ORDER — ROCURONIUM BROMIDE 10 MG/ML (PF) SYRINGE
PREFILLED_SYRINGE | INTRAVENOUS | Status: DC | PRN
  Administered 2016-07-19: 30 mg via INTRAVENOUS
  Administered 2016-07-19: 20 mg via INTRAVENOUS

## 2016-07-19 MED ORDER — DIPHENHYDRAMINE HCL 50 MG/ML IJ SOLN
25.0000 mg | Freq: Four times a day (QID) | INTRAMUSCULAR | Status: DC | PRN
  Administered 2016-07-19: 25 mg via INTRAVENOUS

## 2016-07-19 MED ORDER — 0.9 % SODIUM CHLORIDE (POUR BTL) OPTIME
TOPICAL | Status: DC | PRN
  Administered 2016-07-19: 1000 mL

## 2016-07-19 MED ORDER — HYDROMORPHONE HCL 1 MG/ML IJ SOLN
0.2500 mg | INTRAMUSCULAR | Status: DC | PRN
  Administered 2016-07-19 (×2): 0.5 mg via INTRAVENOUS

## 2016-07-19 MED ORDER — PREMIER PROTEIN SHAKE: 2.0000 [oz_av] | ORAL | Status: DC

## 2016-07-19 MED ORDER — LACTATED RINGERS IV SOLN: INTRAVENOUS | Status: DC

## 2016-07-19 MED ORDER — FENTANYL CITRATE (PF) 100 MCG/2ML IJ SOLN
INTRAMUSCULAR | Status: DC | PRN
  Administered 2016-07-19 (×5): 50 ug via INTRAVENOUS

## 2016-07-19 MED ORDER — HYDRALAZINE HCL 20 MG/ML IJ SOLN
INTRAMUSCULAR | Status: DC | PRN
  Administered 2016-07-19 (×2): 5 mg via INTRAVENOUS

## 2016-07-19 MED ORDER — LIDOCAINE HCL (CARDIAC) 20 MG/ML IV SOLN
INTRAVENOUS | Status: DC | PRN
  Administered 2016-07-19: 100 mg via INTRAVENOUS

## 2016-07-19 MED ORDER — LACTATED RINGERS IR SOLN
Status: DC | PRN
  Administered 2016-07-19: 3000 mL

## 2016-07-19 MED ORDER — CEFOTETAN DISODIUM-DEXTROSE 2-2.08 GM-% IV SOLR
2.0000 g | INTRAVENOUS | Status: AC
  Administered 2016-07-19: 2 g via INTRAVENOUS

## 2016-07-19 MED ORDER — CEFOTETAN DISODIUM-DEXTROSE 2-2.08 GM-% IV SOLR
INTRAVENOUS | Status: AC
  Filled 2016-07-19: qty 50

## 2016-07-19 MED ORDER — BUPIVACAINE HCL (PF) 0.5 % IJ SOLN
INTRAMUSCULAR | Status: AC
  Filled 2016-07-19: qty 30

## 2016-07-19 MED ORDER — OXYCODONE HCL 5 MG/5ML PO SOLN: 5.0000 mg | ORAL | Status: DC | PRN

## 2016-07-19 MED ORDER — HYDROMORPHONE HCL 1 MG/ML IJ SOLN
0.5000 mg | INTRAMUSCULAR | Status: DC | PRN
  Administered 2016-07-19 – 2016-07-20 (×3): 0.5 mg via INTRAVENOUS
  Filled 2016-07-19 (×3): qty 1

## 2016-07-19 MED ORDER — PROMETHAZINE HCL 25 MG/ML IJ SOLN
6.2500 mg | INTRAMUSCULAR | Status: DC | PRN
  Administered 2016-07-19: 6.25 mg via INTRAVENOUS

## 2016-07-19 MED ORDER — ACETAMINOPHEN 160 MG/5ML PO SOLN
650.0000 mg | ORAL | Status: DC | PRN
  Administered 2016-07-20 (×2): 650 mg via ORAL
  Filled 2016-07-19 (×2): qty 20.3

## 2016-07-19 MED ORDER — ONDANSETRON HCL 4 MG/2ML IJ SOLN
4.0000 mg | INTRAMUSCULAR | Status: DC | PRN
  Administered 2016-07-19 (×2): 4 mg via INTRAVENOUS
  Filled 2016-07-19 (×2): qty 2

## 2016-07-19 MED ORDER — LACTATED RINGERS IV SOLN
INTRAVENOUS | Status: DC | PRN
  Administered 2016-07-19: 07:00:00 via INTRAVENOUS

## 2016-07-19 MED ORDER — ENOXAPARIN SODIUM 30 MG/0.3ML ~~LOC~~ SOLN
30.0000 mg | Freq: Two times a day (BID) | SUBCUTANEOUS | Status: DC
  Administered 2016-07-20: 30 mg via SUBCUTANEOUS
  Filled 2016-07-19: qty 0.3

## 2016-07-19 MED ORDER — MORPHINE SULFATE (PF) 2 MG/ML IV SOLN
2.0000 mg | INTRAVENOUS | Status: DC | PRN
  Administered 2016-07-19: 2 mg via INTRAVENOUS
  Filled 2016-07-19: qty 1

## 2016-07-19 MED ORDER — ONDANSETRON HCL 4 MG/2ML IJ SOLN
INTRAMUSCULAR | Status: AC
  Filled 2016-07-19: qty 2

## 2016-07-19 MED ORDER — HYDROMORPHONE HCL 2 MG/ML IJ SOLN
INTRAMUSCULAR | Status: AC
  Filled 2016-07-19: qty 1

## 2016-07-19 MED ORDER — ROCURONIUM BROMIDE 50 MG/5ML IV SOSY
PREFILLED_SYRINGE | INTRAVENOUS | Status: AC
  Filled 2016-07-19: qty 5

## 2016-07-19 MED ORDER — SODIUM CHLORIDE 0.9 % IV SOLN
INTRAVENOUS | Status: DC
  Administered 2016-07-19 – 2016-07-20 (×2): via INTRAVENOUS

## 2016-07-19 MED ORDER — SUCCINYLCHOLINE CHLORIDE 200 MG/10ML IV SOSY
PREFILLED_SYRINGE | INTRAVENOUS | Status: DC | PRN
  Administered 2016-07-19: 140 mg via INTRAVENOUS

## 2016-07-19 MED ORDER — EPHEDRINE 5 MG/ML INJ
INTRAVENOUS | Status: AC
  Filled 2016-07-19: qty 10

## 2016-07-19 MED ORDER — ACETAMINOPHEN 10 MG/ML IV SOLN
1000.0000 mg | Freq: Four times a day (QID) | INTRAVENOUS | Status: AC
  Administered 2016-07-19 – 2016-07-20 (×4): 1000 mg via INTRAVENOUS
  Filled 2016-07-19 (×4): qty 100

## 2016-07-19 MED ORDER — SUGAMMADEX SODIUM 200 MG/2ML IV SOLN
INTRAVENOUS | Status: DC | PRN
  Administered 2016-07-19: 200 mg via INTRAVENOUS

## 2016-07-19 MED ORDER — EPHEDRINE SULFATE-NACL 50-0.9 MG/10ML-% IV SOSY
PREFILLED_SYRINGE | INTRAVENOUS | Status: DC | PRN
  Administered 2016-07-19 (×2): 5 mg via INTRAVENOUS

## 2016-07-19 MED ORDER — SUGAMMADEX SODIUM 200 MG/2ML IV SOLN
INTRAVENOUS | Status: AC
  Filled 2016-07-19: qty 2

## 2016-07-19 MED ORDER — CHLORHEXIDINE GLUCONATE 4 % EX LIQD: 60.0000 mL | Freq: Once | CUTANEOUS | Status: DC

## 2016-07-19 MED ORDER — SUCCINYLCHOLINE CHLORIDE 200 MG/10ML IV SOSY
PREFILLED_SYRINGE | INTRAVENOUS | Status: AC
  Filled 2016-07-19: qty 10

## 2016-07-19 MED ORDER — ALBUTEROL SULFATE (2.5 MG/3ML) 0.083% IN NEBU: 3.0000 mL | INHALATION_SOLUTION | Freq: Four times a day (QID) | RESPIRATORY_TRACT | Status: DC | PRN

## 2016-07-19 MED ORDER — MIDAZOLAM HCL 5 MG/5ML IJ SOLN
INTRAMUSCULAR | Status: DC | PRN
  Administered 2016-07-19: 2 mg via INTRAVENOUS

## 2016-07-19 MED ORDER — LIP MEDEX EX OINT
TOPICAL_OINTMENT | CUTANEOUS | Status: AC
  Filled 2016-07-19: qty 7

## 2016-07-19 MED ORDER — MIDAZOLAM HCL 2 MG/2ML IJ SOLN
INTRAMUSCULAR | Status: AC
  Filled 2016-07-19: qty 2

## 2016-07-19 MED ORDER — BUPIVACAINE HCL 0.5 % IJ SOLN
INTRAMUSCULAR | Status: DC | PRN
  Administered 2016-07-19: 20 mL

## 2016-07-19 MED ORDER — HEPARIN SODIUM (PORCINE) 5000 UNIT/ML IJ SOLN
5000.0000 [IU] | INTRAMUSCULAR | Status: AC
  Administered 2016-07-19: 5000 [IU] via SUBCUTANEOUS
  Filled 2016-07-19: qty 1

## 2016-07-19 MED ORDER — HYDRALAZINE HCL 20 MG/ML IJ SOLN
INTRAMUSCULAR | Status: AC
  Filled 2016-07-19: qty 1

## 2016-07-19 MED ORDER — HYDROMORPHONE HCL 1 MG/ML IJ SOLN
INTRAMUSCULAR | Status: DC | PRN
  Administered 2016-07-19: 0.5 mg via INTRAVENOUS

## 2016-07-19 MED ORDER — ONDANSETRON HCL 4 MG/2ML IJ SOLN
INTRAMUSCULAR | Status: DC | PRN
  Administered 2016-07-19: 4 mg via INTRAVENOUS

## 2016-07-19 MED ORDER — PROPOFOL 10 MG/ML IV BOLUS
INTRAVENOUS | Status: AC
  Filled 2016-07-19: qty 20

## 2016-07-19 MED ORDER — APREPITANT 80 MG PO CAPS
80.0000 mg | ORAL_CAPSULE | Freq: Once | ORAL | Status: AC
  Administered 2016-07-19: 80 mg via ORAL
  Filled 2016-07-19: qty 1

## 2016-07-19 MED ORDER — PROPOFOL 10 MG/ML IV BOLUS
INTRAVENOUS | Status: DC | PRN
  Administered 2016-07-19: 150 mg via INTRAVENOUS

## 2016-07-19 MED ORDER — DEXAMETHASONE SODIUM PHOSPHATE 10 MG/ML IJ SOLN
INTRAMUSCULAR | Status: AC
  Filled 2016-07-19: qty 1

## 2016-07-19 MED ORDER — CEFOTETAN DISODIUM-DEXTROSE 2-2.08 GM-% IV SOLR: 2.0000 g | INTRAVENOUS | Status: DC

## 2016-07-19 MED ORDER — HYDROMORPHONE HCL 1 MG/ML IJ SOLN
INTRAMUSCULAR | Status: AC
  Filled 2016-07-19: qty 1

## 2016-07-19 MED ORDER — ACETAMINOPHEN 160 MG/5ML PO SOLN
325.0000 mg | ORAL | Status: DC | PRN
Start: 2016-07-20 — End: 2016-07-20

## 2016-07-19 MED ORDER — PROMETHAZINE HCL 25 MG/ML IJ SOLN
INTRAMUSCULAR | Status: AC
  Filled 2016-07-19: qty 1

## 2016-07-19 MED ORDER — LIDOCAINE 2% (20 MG/ML) 5 ML SYRINGE
INTRAMUSCULAR | Status: AC
  Filled 2016-07-19: qty 5

## 2016-07-19 MED ORDER — CHLORHEXIDINE GLUCONATE 4 % EX LIQD
60.0000 mL | Freq: Once | CUTANEOUS | Status: DC
Start: 2016-07-20 — End: 2016-07-19

## 2016-07-19 MED ORDER — BUPIVACAINE LIPOSOME 1.3 % IJ SUSP
20.0000 mL | Freq: Once | INTRAMUSCULAR | Status: AC
Start: 2016-07-19 — End: 2016-07-19
  Administered 2016-07-19: 20 mL
  Filled 2016-07-19: qty 20

## 2016-07-19 MED ORDER — FENTANYL CITRATE (PF) 250 MCG/5ML IJ SOLN
INTRAMUSCULAR | Status: AC
  Filled 2016-07-19: qty 5

## 2016-07-19 MED ORDER — DIPHENHYDRAMINE HCL 50 MG/ML IJ SOLN
INTRAMUSCULAR | Status: AC
  Filled 2016-07-19: qty 1

## 2016-07-19 SURGICAL SUPPLY — 64 items
ADH SKN CLS APL DERMABOND .7 (GAUZE/BANDAGES/DRESSINGS) ×1
APPLIER CLIP 5 13 M/L LIGAMAX5 (MISCELLANEOUS)
APPLIER CLIP ROT 10 11.4 M/L (STAPLE)
APPLIER CLIP ROT 13.4 12 LRG (CLIP)
APR CLP LRG 13.4X12 ROT 20 MLT (CLIP)
APR CLP MED LRG 11.4X10 (STAPLE)
APR CLP MED LRG 5 ANG JAW (MISCELLANEOUS)
BAG SPEC RTRVL LRG 6X4 10 (ENDOMECHANICALS) ×1
BLADE SURG SZ11 CARB STEEL (BLADE) ×2 IMPLANT
CABLE HIGH FREQUENCY MONO STRZ (ELECTRODE) ×1 IMPLANT
CHLORAPREP W/TINT 26ML (MISCELLANEOUS) ×2 IMPLANT
CLIP APPLIE 5 13 M/L LIGAMAX5 (MISCELLANEOUS) IMPLANT
CLIP APPLIE ROT 10 11.4 M/L (STAPLE) IMPLANT
CLIP APPLIE ROT 13.4 12 LRG (CLIP) IMPLANT
COVER SURGICAL LIGHT HANDLE (MISCELLANEOUS) ×2 IMPLANT
DERMABOND ADVANCED (GAUZE/BANDAGES/DRESSINGS) ×1
DERMABOND ADVANCED .7 DNX12 (GAUZE/BANDAGES/DRESSINGS) ×1 IMPLANT
DRAIN CHANNEL 19F RND (DRAIN) IMPLANT
ELECT REM PT RETURN 9FT ADLT (ELECTROSURGICAL) ×2
ELECTRODE REM PT RTRN 9FT ADLT (ELECTROSURGICAL) ×1 IMPLANT
EVACUATOR SILICONE 100CC (DRAIN) IMPLANT
GAUZE SPONGE 4X4 12PLY STRL (GAUZE/BANDAGES/DRESSINGS) IMPLANT
GLOVE BIOGEL PI IND STRL 7.0 (GLOVE) ×1 IMPLANT
GLOVE BIOGEL PI INDICATOR 7.0 (GLOVE) ×1
GLOVE SURG SS PI 7.0 STRL IVOR (GLOVE) ×2 IMPLANT
GOWN STRL REUS W/TWL LRG LVL3 (GOWN DISPOSABLE) ×2 IMPLANT
GOWN STRL REUS W/TWL XL LVL3 (GOWN DISPOSABLE) ×6 IMPLANT
GRASPER SUT TROCAR 14GX15 (MISCELLANEOUS) ×2 IMPLANT
HANDLE STAPLE EGIA 4 XL (STAPLE) ×2 IMPLANT
HOVERMATT SINGLE USE (MISCELLANEOUS) ×2 IMPLANT
IRRIG SUCT STRYKERFLOW 2 WTIP (MISCELLANEOUS) ×2
IRRIGATION SUCT STRKRFLW 2 WTP (MISCELLANEOUS) ×1 IMPLANT
KIT BASIN OR (CUSTOM PROCEDURE TRAY) ×2 IMPLANT
MARKER SKIN DUAL TIP RULER LAB (MISCELLANEOUS) ×2 IMPLANT
NDL SPNL 22GX3.5 QUINCKE BK (NEEDLE) ×1 IMPLANT
NEEDLE SPNL 22GX3.5 QUINCKE BK (NEEDLE) ×2 IMPLANT
PACK CARDIOVASCULAR III (CUSTOM PROCEDURE TRAY) ×2 IMPLANT
POUCH SPECIMEN RETRIEVAL 10MM (ENDOMECHANICALS) ×2 IMPLANT
RELOAD EGIA 45 MED/THCK PURPLE (STAPLE) IMPLANT
RELOAD STAPLE 45 PURP MED/THCK (STAPLE) IMPLANT
RELOAD TRI 45 ART MED THCK BLK (STAPLE) IMPLANT
RELOAD TRI 45 ART MED THCK PUR (STAPLE) ×2 IMPLANT
RELOAD TRI 60 ART MED THCK BLK (STAPLE) ×4 IMPLANT
RELOAD TRI 60 ART MED THCK PUR (STAPLE) ×3 IMPLANT
SCISSORS LAP 5X45 EPIX DISP (ENDOMECHANICALS) ×2 IMPLANT
SHEARS HARMONIC ACE PLUS 45CM (MISCELLANEOUS) ×2 IMPLANT
SLEEVE GASTRECTOMY 40FR VISIGI (MISCELLANEOUS) ×2 IMPLANT
SLEEVE XCEL OPT CAN 5 100 (ENDOMECHANICALS) ×4 IMPLANT
SOLUTION ANTI FOG 6CC (MISCELLANEOUS) ×1 IMPLANT
SPONGE LAP 18X18 X RAY DECT (DISPOSABLE) ×1 IMPLANT
SUT ETHIBOND 0 36 GRN (SUTURE) ×1 IMPLANT
SUT ETHILON 2 0 PS N (SUTURE) IMPLANT
SUT MNCRL AB 4-0 PS2 18 (SUTURE) ×2 IMPLANT
SUT SILK 0 SH 30 (SUTURE) IMPLANT
SUT VICRYL 0 TIES 12 18 (SUTURE) ×2 IMPLANT
SYR 20CC LL (SYRINGE) ×2 IMPLANT
SYR 50ML LL SCALE MARK (SYRINGE) ×2 IMPLANT
TOWEL OR 17X26 10 PK STRL BLUE (TOWEL DISPOSABLE) ×2 IMPLANT
TOWEL OR NON WOVEN STRL DISP B (DISPOSABLE) ×2 IMPLANT
TROCAR BLADELESS 15MM (ENDOMECHANICALS) ×2 IMPLANT
TROCAR BLADELESS OPT 5 100 (ENDOMECHANICALS) ×2 IMPLANT
TUBING CONNECTING 10 (TUBING) ×3 IMPLANT
TUBING ENDO SMARTCAP (MISCELLANEOUS) ×2 IMPLANT
TUBING INSUF HEATED (TUBING) ×2 IMPLANT

## 2016-07-19 NOTE — Anesthesia Postprocedure Evaluation (Signed)
Anesthesia Post Note  Patient: [name]  Procedure(s) Performed: Procedure(s) (LRB): LAPAROSCOPIC GASTRIC SLEEVE RESECTION WITH HIATAL HERNIA REPAIR (N/A)  Patient location during evaluation: PACU Anesthesia Type: General Level of consciousness: awake and alert Pain management: pain level controlled Vital Signs Assessment: post-procedure vital signs reviewed and stable Respiratory status: spontaneous breathing, nonlabored ventilation, respiratory function stable and patient connected to nasal cannula oxygen Cardiovascular status: blood pressure returned to baseline and stable Postop Assessment: no signs of nausea or vomiting Anesthetic complications: no    Last Vitals:  Vitals:   07/19/16 0909 07/19/16 0945  BP: (!) 184/90   Pulse: 96   Resp: 16   Temp: 36.9 C 36.4 C    Last Pain:  Vitals:   07/19/16 0930  TempSrc:   PainSc: 6                  Shahzad Thomann S

## 2016-07-19 NOTE — Op Note (Signed)
Preoperative diagnosis: laparoscopic sleeve gastrectomy  Postoperative diagnosis: Same   Procedure: Upper endoscopy   Surgeon: Clovis Riley, M.D.  Anesthesia: Gen.   Indications for procedure: This patient was undergoing a laparoscopic sleeve gastrectomy.   Description of procedure: The endoscopy was placed in the mouth and into the oropharynx and under endoscopic vision it was advanced to the esophagogastric junction. The pouch was insufflated and no bleeding or bubbles were seen. The GEJ was identified at 44cm from the teeth. No signs of reflux esophagitis present. No angulation at the hiatus from the hiatal repair. No bleeding or leaks were detected. The scope was withdrawn without difficulty.   Clovis Riley, M.D. General, Bariatric, & Minimally Invasive Surgery [facility]

## 2016-07-19 NOTE — Anesthesia Procedure Notes (Signed)
Procedure Name: Intubation Date/Time: 07/19/2016 7:29 AM Performed by: Carleene Cooper A Pre-anesthesia Checklist: Patient identified, Emergency Drugs available, Suction available and Patient being monitored Patient Re-evaluated:Patient Re-evaluated prior to inductionOxygen Delivery Method: Circle system utilized Preoxygenation: Pre-oxygenation with 100% oxygen Intubation Type: IV induction Ventilation: Mask ventilation without difficulty Laryngoscope Size: Miller and 3 Grade View: Grade I Tube type: Oral Tube size: 7.5 mm Number of attempts: 1 Airway Equipment and Method: Stylet Placement Confirmation: ETT inserted through vocal cords under direct vision,  positive ETCO2 and breath sounds checked- equal and bilateral Secured at: 21 cm Tube secured with: Tape Dental Injury: Teeth and Oropharynx as per pre-operative assessment

## 2016-07-19 NOTE — H&P (Signed)
[name] is an 60 y.o. female.   Chief Complaint: obesity HPI: 60 yo female with obesity. She has made minor improvements in diet and completed all required preoperative workup. She has HTN, HL, history of PUD on PPI. She has a small HH on UGI.  Past Medical History:  Diagnosis Date  . Arthritis   . Cancer ([place]) 1992   cervical  . Chronic back pain   . COPD (chronic obstructive pulmonary disease) ([place])   . Diverticulosis   . Fluid retention   . GERD (gastroesophageal reflux disease)   . High cholesterol   . Hypertension   . Hypokalemia   . Peptic ulcer   . Peripheral neuropathy ([place])   . Pneumonia    hospitalized in March 2016   . PONV (postoperative nausea and vomiting)   . Pre-diabetes     Past Surgical History:  Procedure Laterality Date  . ABDOMINAL HYSTERECTOMY    . CHOLECYSTECTOMY    . COLONOSCOPY WITH ESOPHAGOGASTRODUODENOSCOPY (EGD) N/A 06/08/2013   [id number] antral ulcer status post biopsy (benign). Hiatal hernia/ Tortuous colon. Internal hemorrhoids. Colonic diverticulosis. Single colonic polyp removed as described above (too small to process). Next TCS 05/2023  . ESOPHAGOGASTRODUODENOSCOPY N/A 05/20/2014   [id number] ulcer ranging between 3-29mm in size was found in the gastric antrum with satellite erosions. Status post ulcer body. Likely NSAID effect.  . INTRAMEDULLARY (IM) NAIL INTERTROCHANTERIC Left 10/05/2013   Procedure: ORIF LEFT HIP  INTERTROCHANTRIC FRACTURE;  Surgeon: [name], MD;  Location: [facility];  Service: Orthopedics;  Laterality: Left;  . KNEE SURGERY     right and left  . TOTAL KNEE ARTHROPLASTY Right 01/29/2013   Procedure: TOTAL KNEE ARTHROPLASTY WITH HARDWARE REMOVAL;  Surgeon: [name], MD;  Location: [facility];  Service: Orthopedics;  Laterality: Right;  DEPUY/SIGMA RP, SYNTHES SCREWDRIVERS  . TOTAL KNEE ARTHROPLASTY Left 06/11/2013   Dr Mayer Camel  . TOTAL KNEE ARTHROPLASTY Left 06/11/2013   Procedure: TOTAL KNEE ARTHROPLASTY;   Surgeon: [name], MD;  Location: [facility];  Service: Orthopedics;  Laterality: Left;    Family History  Problem Relation Age of Onset  . Ulcers Father   . Prostate cancer Father   . Kidney cancer Father   . Hypertension Father   . Parkinson's disease Father   . Hypercholesterolemia Father   . Heart disease Father   . Hypotension Mother   . Hypercholesterolemia Mother   . Lung cancer Mother   . Bone cancer Mother   . Neuropathy Neg Hx    Social History:  reports that she quit smoking about 3 weeks ago. Her smoking use included Cigarettes. She has a 21.50 pack-year smoking history. She has never used smokeless tobacco. She reports that she does not drink alcohol or use drugs.  Allergies:  Allergies  Allergen Reactions  . Clindamycin/Lincomycin Swelling    Swelling feet  . Codeine Itching  . Levaquin [Levofloxacin] Swelling  . Nsaids     Needs to avoid d/t peptic ulcer    Medications Prior to Admission  Medication Sig Dispense Refill  . albuterol (PROVENTIL HFA;VENTOLIN HFA) 108 (90 Base) MCG/ACT inhaler Inhale 1-2 puffs into the lungs every 6 (six) hours as needed for wheezing or shortness of breath.    Marland Kitchen amitriptyline (ELAVIL) 25 MG tablet Take 25 mg by mouth at bedtime as needed for sleep.     Marland Kitchen aspirin 81 MG tablet Take 81 mg by mouth daily.    Marland Kitchen atorvastatin (LIPITOR) 20 MG  tablet Take 20 mg by mouth daily. Patient stopped on 07/12/2016  2  . fluticasone (FLONASE) 50 MCG/ACT nasal spray Place 1 spray into the nose 2 (two) times daily as needed (for allergies/throat irritation).     . furosemide (LASIX) 20 MG tablet Take 20 mg by mouth daily. Stopped on 07/11/2016    . gabapentin (NEURONTIN) 300 MG capsule Take 1 capsule (300 mg total) by mouth 3 (three) times daily. (Patient taking differently: Take 300 mg by mouth 4 (four) times daily. ) 90 capsule 11  . HYDROcodone-acetaminophen (NORCO) 7.5-325 MG tablet Take 1 tablet by mouth 3 (three) times daily.  0  . Linaclotide  (LINZESS) 290 MCG CAPS capsule Take 1 capsule (290 mcg total) by mouth daily. 30 capsule 11  . MOVANTIK 25 MG TABS tablet TAKE 1 TABLET BY MOUTH EVERY DAY 1 HOUR BEFORE A MEAL 30 tablet 1  . omega-3 acid ethyl esters (LOVAZA) 1 g capsule Take 1 g by mouth 2 (two) times daily.  10  . pantoprazole (PROTONIX) 40 MG tablet Take 1 tablet (40 mg total) by mouth daily. 30 tablet 11  . valsartan (DIOVAN) 160 MG tablet Take 160 mg by mouth daily. As of 07/12/2016 patient is taking only 80 mg in the am    . varenicline (CHANTIX) 1 MG tablet Take 1 mg by mouth 2 (two) times daily.    . Calcium 500 MG CHEW Chew 500 mg by mouth daily.    . Cyanocobalamin (HM SUPER VITAMIN B12) 2500 MCG CHEW Chew 2,500 mcg by mouth daily.    . meloxicam (MOBIC) 15 MG tablet Take 15 mg by mouth daily.  1  . Multiple Vitamins-Minerals (MULTIVITAMIN GUMMIES WOMENS) CHEW Chew 1 tablet by mouth daily. Women's Ultra Mega Soft Chewable    . ondansetron (ZOFRAN-ODT) 4 MG disintegrating tablet Take 4 mg by mouth every 6 (six) hours as needed for nausea/vomiting.  0  . oxyCODONE (ROXICODONE) 5 MG/5ML solution Take 5-10 mLs by mouth every 4 (four) hours as needed for pain.  0    No results found for this or any previous visit (from the past 48 hour(s)). No results found.  Review of Systems  Constitutional: Negative for chills and fever.  HENT: Negative for hearing loss.   Eyes: Negative for blurred vision and double vision.  Respiratory: Negative for cough and hemoptysis.   Cardiovascular: Negative for chest pain and palpitations.  Gastrointestinal: Negative for abdominal pain, nausea and vomiting.  Genitourinary: Negative for dysuria and urgency.  Musculoskeletal: Negative for myalgias and neck pain.  Skin: Negative for itching and rash.  Neurological: Negative for dizziness, tingling and headaches.  Endo/Heme/Allergies: Does not bruise/bleed easily.  Psychiatric/Behavioral: Negative for depression and suicidal ideas.     Blood pressure (!) 168/89, pulse 74, temperature 98.4 F (36.9 C), temperature source Oral, resp. rate 18, height 5\' 6"  (1.676 m), weight 112.1 kg (247 lb 2 oz), SpO2 98 %. Physical Exam  Vitals reviewed. Constitutional: She is oriented to person, place, and time. She appears well-developed and well-nourished.  HENT:  Head: Normocephalic and atraumatic.  Eyes: Conjunctivae and EOM are normal. Pupils are equal, round, and reactive to light.  Neck: Normal range of motion. Neck supple.  Cardiovascular: Normal rate and regular rhythm.   Respiratory: Effort normal and breath sounds normal.  GI: Soft. Bowel sounds are normal. She exhibits no distension. There is no tenderness.  Musculoskeletal: Normal range of motion.  Neurological: She is alert and oriented to person, place, and  time.  Skin: Skin is warm and dry.  Psychiatric: She has a normal mood and affect. Her behavior is normal.     Assessment/Plan 60 yo female with morbid obesity -lap sleeve -normal postop at [facility], MD 07/19/2016, 7:15 AM

## 2016-07-19 NOTE — Anesthesia Preprocedure Evaluation (Addendum)
Anesthesia Evaluation  Patient identified by MRN, date of birth, ID band Patient awake    Reviewed: Allergy & Precautions, NPO status , Patient's Chart, lab work & pertinent test results  Airway Mallampati: II  TM Distance: >3 FB Neck ROM: Full    Dental no notable dental hx.    Pulmonary COPD, former smoker,    Pulmonary exam normal breath sounds clear to auscultation       Cardiovascular hypertension, Normal cardiovascular exam Rhythm:Regular Rate:Normal     Neuro/Psych negative neurological ROS  negative psych ROS   GI/Hepatic Neg liver ROS, GERD  ,  Endo/Other  Morbid obesity  Renal/GU negative Renal ROS  negative genitourinary   Musculoskeletal negative musculoskeletal ROS (+)   Abdominal   Peds negative pediatric ROS (+)  Hematology negative hematology ROS (+)   Anesthesia Other Findings   Reproductive/Obstetrics negative OB ROS                             Anesthesia Physical Anesthesia Plan  ASA: III  Anesthesia Plan: General   Post-op Pain Management:    Induction: Intravenous  Airway Management Planned: Oral ETT  Additional Equipment:   Intra-op Plan:   Post-operative Plan: Extubation in OR  Informed Consent: I have reviewed the patients History and Physical, chart, labs and discussed the procedure including the risks, benefits and alternatives for the proposed anesthesia with the patient or authorized representative who has indicated his/her understanding and acceptance.   Dental advisory given  Plan Discussed with: CRNA and Surgeon  Anesthesia Plan Comments:         Anesthesia Quick Evaluation

## 2016-07-19 NOTE — Transfer of Care (Signed)
Immediate Anesthesia Transfer of Care Note  Patient: [name]  Procedure(s) Performed: Procedure(s): LAPAROSCOPIC GASTRIC SLEEVE RESECTION WITH HIATAL HERNIA REPAIR (N/A)  Patient Location: PACU  Anesthesia Type:General  Level of Consciousness: awake, alert , oriented and patient cooperative  Airway & Oxygen Therapy: Patient Spontanous Breathing and Patient connected to face mask oxygen  Post-op Assessment: Report given to RN, Post -op Vital signs reviewed and stable and Patient moving all extremities  Post vital signs: Reviewed and stable  Last Vitals:  Vitals:   07/19/16 0604 07/19/16 0610  BP:  (!) 168/89  Pulse: 74   Resp: 18   Temp: 36.9 C     Last Pain:  Vitals:   07/19/16 0604  TempSrc: Oral         Complications: No apparent anesthesia complications

## 2016-07-19 NOTE — Discharge Instructions (Addendum)
[facility] Discharge Instructions  °On the day of surgery you were given the medication aprepitant. This medication interacts with hormonal forms of birth control (oral contraceptives and injected or implanted birth control) and may make them ineffective. °IF YOU USE ANY HORMONAL FORM OF BIRTH CONTROL, YOU MUST USE AN ADDITIONAL BARRIER BIRTH CONTROL METHOD FOR ONE MONTH after receiving aprepitant or there is a chance you could become pregnant. ° ° ° ° °GASTRIC BYPASS/SLEEVE ° Home Care Instructions ° ° These instructions are to help you care for yourself when you go home. ° °Call: If you have any problems. °• Call [phone] and ask for the surgeon on call °• If you need immediate assistance come to the ER at [facility]. Tell the ER staff you are a new post-op gastric bypass or gastric sleeve patient  °Signs and symptoms to report: • Severe  vomiting or nausea °o If you cannot handle clear liquids for longer than 1 day, call your surgeon °• Abdominal pain which does not get better after taking your pain medication °• Fever greater than 100.4°  F and chills °• Heart rate over 100 beats a minute °• Trouble breathing °• Chest pain °• Redness,  swelling, drainage, or foul odor at incision (surgical) sites °• If your incisions open or pull apart °• Swelling or pain in calf (lower leg) °• Diarrhea (Loose bowel movements that happen often), frequent watery, uncontrolled bowel movements °• Constipation, (no bowel movements for 3 days) if this happens: °o Take Milk of Magnesia, 2 tablespoons by mouth, 3 times a day for 2 days if needed °o Stop taking Milk of Magnesia once you have had a bowel movement °o Call your doctor if constipation continues °Or °o Take Miralax  (instead of Milk of Magnesia) following the label instructions °o Stop taking Miralax once you have had a bowel movement °o Call your doctor if constipation continues °• Anything you think is “abnormal for you” °  °Normal side effects after surgery: • Unable  to sleep at night or unable to concentrate °• Irritability °• Being tearful (crying) or depressed ° °These are common complaints, possibly related to your anesthesia, stress of surgery, and change in lifestyle, that usually go away a few weeks after surgery. If these feelings continue, call your medical doctor.  °Wound Care: You may have surgical glue, steri-strips, or staples over your incisions after surgery °• Surgical glue: Looks like clear film over your incisions and will wear off a little at a time °• Steri-strips: Adhesive strips of tape over your incisions. You may notice a yellowish color on skin under the steri-strips. This is used to make the steri-strips stick better. Do not pull the steri-strips off - let them fall off °• Staples: Staples may be removed before you leave the hospital °o If you go home with staples, call [facility] for an appointment with your surgeon’s nurse to have staples removed 10 days after surgery, [phone] °• Showering: You may shower two (2) days after your surgery unless your surgeon tells you differently °o Wash gently around incisions with warm soapy water, rinse well, and gently pat dry °o If you have a drain (tube from your incision), you may need someone to hold this while you shower °o No tub baths until staples are removed and incisions are healed °  °Medications: • Medications should be liquid or crushed if larger than the size of a dime °• Extended release pills (medication that releases a little bit at   a time through the  day) should not be crushed °• Depending on the size and number of medications you take, you may need to space (take a few throughout the day)/change the time you take your medications so that you do not over-fill your pouch (smaller stomach) °• Make sure you follow-up with you primary care physician to make medication changes needed during rapid weight loss and life -style changes °• If you have diabetes, follow up with your  doctor that orders your diabetes medication(s) within one week after surgery and check your blood sugar regularly ° °• Do not drive while taking narcotics (pain medications) ° °• Do not take acetaminophen (Tylenol) and Roxicet or Lortab Elixir at the same time since these pain medications contain acetaminophen °  °Diet:  °First 2 Weeks You will see the nutritionist about two (2) weeks after your surgery. The nutritionist will increase the types of foods you can eat if you are handling liquids well: °• If you have severe vomiting or nausea and cannot handle clear liquids lasting longer than 1 day call your surgeon °Protein Shake °• Drink at least 2 ounces of shake 5-6 times per day °• Each serving of protein shakes (usually 8-12 ounces) should have a minimum of: °o 15 grams of protein °o And no more than 5 grams of carbohydrate °• Goal for protein each day: °o Men = 80 grams per day °o Women = 60 grams per day °  ° • Protein powder may be added to fluids such as non-fat milk or Lactaid milk or Soy milk (limit to 35 grams added protein powder per serving) ° °Hydration °• Slowly increase the amount of water and other clear liquids as tolerated (See Acceptable Fluids) °• Slowly increase the amount of protein shake as tolerated °• Sip fluids slowly and throughout the day °• May use sugar substitutes in small amounts (no more than 6-8 packets per day; i.e. Splenda) ° °Fluid Goal °• The first goal is to drink at least 8 ounces of protein shake/drink per day (or as directed by the nutritionist); some examples of protein shakes are Syntrax Nectar, Adkins Advantage, EAS Edge HP, and Unjury. - See handout from pre-op Bariatric Education Class: °o Slowly increase the amount of protein shake you drink as tolerated °o You may find it easier to slowly sip shakes throughout the day °o It is important to get your proteins in first °• Your fluid goal is to drink 64-100 ounces of fluid daily °o It may take a few weeks to build up to  this  °• 32 oz. (or more) should be clear liquids °And °• 32 oz. (or more) should be full liquids (see below for examples) °• Liquids should not contain sugar, caffeine, or carbonation ° °Clear Liquids: °• Water of Sugar-free flavored water (i.e. Fruit H²O, Propel) °• Decaffeinated coffee or tea (sugar-free) °• Crystal lite, Wyler’s Lite, Minute Maid Lite °• Sugar-free Jell-O °• Bouillon or broth °• Sugar-free Popsicle:    - Less than 20 calories each; Limit 1 per day ° °Full Liquids: °                  Protein Shakes/Drinks + 2 choices per day of other full liquids °• Full liquids must be: °o No More Than 12 grams of Carbs per serving °o No More Than 3 grams of Fat per serving °• Strained low-fat cream soup °• Non-Fat milk °• Fat-free Lactaid Milk °• Sugar-free yogurt (Dannon Lite & Fit, Greek   yogurt) ° °  °Vitamins and Minerals • Start 1 day after surgery unless otherwise directed by your surgeon °• 2 Chewable Multivitamin / Multimineral Supplement with iron (i.e. Centrum for Adults) °• Vitamin B-12, 350-500 micrograms sub-lingual (place tablet under the tongue) each day °• Chewable Calcium Citrate with Vitamin D-3 °(Example: 3 Chewable Calcium  Plus 600 with Vitamin D-3) °o Take 500 mg three (3) times a day for a total of 1500 mg each day °o Do not take all 3 doses of calcium at one time as it may cause constipation, and you can only absorb 500 mg at a time °o Do not mix multivitamins containing iron with calcium supplements;  take 2 hours apart °o Do not substitute Tums (calcium carbonate) for your calcium °• Menstruating women and those at risk for anemia ( a blood disease that causes weakness) may need extra iron °o Talk to your doctor to see if you need more iron °• If you need extra iron: Total daily Iron recommendation (including Vitamins) is 50 to 100 mg Iron/day °• Do not stop taking or change any vitamins or minerals until you talk to your nutritionist or surgeon °• Your nutritionist and/or surgeon must  approve all vitamin and mineral supplements °  °Activity and Exercise: It is important to continue walking at home. Limit your physical activity as instructed by your doctor. During this time, use these guidelines: °• Do not lift anything greater than ten  (10) pounds for at least two (2) weeks °• Do not go back to work or drive until your surgeon says you can °• You may have sex when you feel comfortable °o It is VERY important for female patients to use a reliable birth control method; fertility often increase after surgery °o Do not get pregnant for at least 18 months °• Start exercising as soon as your doctor tells you that you can °o Make sure your doctor approves any physical activity °• Start with a simple walking program °• Walk 5-15 minutes each day, 7 days per week °• Slowly increase until you are walking 30-45 minutes per day °• Consider joining our BELT program. [phone] or email [email or web address] °  °Special Instructions Things to remember: °• Free counseling is available for you and your family through collaboration between [facility] and [employer]. Please call [phone] and leave a message °• Use your CPAP when sleeping if this applies to you °• Consider buying a medical alert bracelet that says you had lap-band surgery °  °  You will likely have your first fill (fluid added to your band) 6 - 8 weeks after surgery °• [facility] has a free Bariatric Surgery Support Group that meets monthly, the 3rd Thursday, 6pm. [employer] [facility]. You can see classes online at [email or web address] °• It is very important to keep all follow up appointments with your surgeon, nutritionist, primary care physician, and behavioral health practitioner °o After the first year, please follow up with your bariatric surgeon and nutritionist at least once a year in order to maintain best weight loss results °      °             [facility]:  [phone] ° °             [facility]: [phone] ° °             Bariatric Nurse Coordinator: [phone]  °Gastric Bypass/Sleeve Home Care   Instructions  Rev. 09/2012    ° °                                                    Reviewed and Endorsed °                                                   by [facility] Patient Education Committee, Jan, 2014 ° ° ° ° ° ° ° ° ° °

## 2016-07-19 NOTE — Op Note (Signed)
Preop Diagnosis: Obesity Class III  Postop Diagnosis: same  Procedure performed: laparoscopic Sleeve Gastrectomy  Assitant: Romana Juniper  Indications:  The patient is a 60 y.o. year-old morbidly obese female who has been followed in the Bariatric Clinic as an outpatient. This patient was diagnosed with morbid obesity with a BMI of Body mass index is 39.89 kg/m. and significant co-morbidities including hypertension.  The patient was counseled extensively in the Bariatric Outpatient Clinic and after a thorough explanation of the risks and benefits of surgery (including death from complications, bowel leak, infection such as peritonitis and/or sepsis, internal hernia, bleeding, need for blood transfusion, bowel obstruction, organ failure, pulmonary embolus, deep venous thrombosis, wound infection, incisional hernia, skin breakdown, and others entailed on the consent form) and after a compliant diet and exercise program, the patient was scheduled for an elective laparoscopic sleeve gastrectomy.  Description of Operation:  Following informed consent, the patient was taken to the operating room and placed on the operating table in the supine position.  She had previously received prophylactic antibiotics and subcutaneous heparin for DVT prophylaxis in the pre-op holding area.  After induction of general endotracheal anesthesia by the anesthesiologist, the patient underwent placement of sequential compression devices, Foley catheter and an oro-gastric tube.  A timeout was confirmed by the surgery and anesthesia teams.  The patient was adequately padded at all pressure points and placed on a footboard to prevent slippage from the OR table during extremes of position during surgery.  She underwent a routine sterile prep and drape of her entire abdomen.    Next, A transverse incision was made under the left subcostal area and a 42mm optical viewing trocar was introduced into the peritoneal cavity.  Pneumoperitoneum was applied with a high flow and low pressure. A laparoscope was inserted to confirm placement. A extraperitoneal block was then placed at the lateral abdominal wall using exparel diluted with marcaine. 5 additional trocars were placed: 1 60mm trocar to the left of the midline. 1 additional 41mm trocar in the left lateral area, 1 53mm trocar in the right mid abdomen, and 1 67mm trocar in the right subcostal area.  First, the pars flaccida was incised and the posterior hiatus dissected. A small defect was found and a 0 ethicon suture was used to close the defect with a single stitch.  The fat pad at the GE junction was incised and the gastrodiaphragmatic ligament was divided using the Harmonic scalpel. Next, a hole was created through the lesser omentum along the greater curve of the stomach to enter the lesser sac. The vessels along the greater omentum were  Then ligated and divided using the Harmonic scalpel moving towards the spleen and then short gastric vessels were ligated and divided in the same fashion to fully mobilize the fundus. The left crus was identified to ensure completion of the dissection. Next the antrum was measured and dissection continued inferiorly along the greater curve towards the pylorus and stopped 6cm from the pylorus.   A 40Fr ViSiGi dilator was placed into the esophgaus and along the lesser curve of the stomach and placed on suction. A 65mm 4-62mm tristapler was used to begin the resection along the antrum being sure to stay well away from the angularis by angling the jaws of the stapler towards the greater curve. An additional 53mm 4-59mm tristapler was used to continue the dissection. Then multiple 58mm 3-87mm tristapler loads were used to complete the resection staying along the Bertha and ensuring the fundus was not  retained by appropriately retracting it lateral. Air was inserted through the [place] to perform a leak test showing no bubbles and a neutral lie of the  stomach.  The assistant then went and performed an upper endoscopy and leak test. No bubbles were seen and the sleeve and antrum distended appropriately. The specimen was then placed in an endocatch bag and removed by the 55mm port. The fascia of the 86mm port was closed with a 0 vicryl by suture passer. Hemostasis was ensured. Pneumoperitoneum was evacuated, all ports were removed and all incisions closed with 4-0 monocryl suture in subcuticular fashion. Glue was put in place for dressing. The patient awoke from anesthesia and was brought to pacu in stable condition. All counts were correct.  Estimated blood loss: <85ml  Specimens:  Sleeve gastrectomy  Post-Op Plan:       Pain Management: PO, prn      Antibiotics: Prophylactic      Anticoagulation: Prophylactic, Starting now      Post Op Studies/Consults: Not applicable      Intended Discharge: within 48h      Intended Outpatient Follow-Up: Two Week      Intended Outpatient Studies: Not Applicable      Other: Not Applicable   [name] Baar

## 2016-07-20 ENCOUNTER — Encounter (HOSPITAL_COMMUNITY): Payer: Self-pay | Admitting: General Surgery

## 2016-07-20 LAB — CBC WITH DIFFERENTIAL/PLATELET
Basophils Absolute: 0 10*3/uL (ref 0.0–0.1)
Basophils Relative: 0 %
Eosinophils Absolute: 0 10*3/uL (ref 0.0–0.7)
Eosinophils Relative: 0 %
HEMATOCRIT: 38.1 % (ref 36.0–46.0)
HEMOGLOBIN: 12.5 g/dL (ref 12.0–15.0)
LYMPHS ABS: 1.1 10*3/uL (ref 0.7–4.0)
LYMPHS PCT: 9 %
MCH: 30.5 pg (ref 26.0–34.0)
MCHC: 32.8 g/dL (ref 30.0–36.0)
MCV: 92.9 fL (ref 78.0–100.0)
Monocytes Absolute: 0.6 10*3/uL (ref 0.1–1.0)
Monocytes Relative: 5 %
NEUTROS PCT: 86 %
Neutro Abs: 9.9 10*3/uL — ABNORMAL HIGH (ref 1.7–7.7)
Platelets: 204 10*3/uL (ref 150–400)
RBC: 4.1 MIL/uL (ref 3.87–5.11)
RDW: 13.2 % (ref 11.5–15.5)
WBC: 11.6 10*3/uL — AB (ref 4.0–10.5)

## 2016-07-20 LAB — COMPREHENSIVE METABOLIC PANEL
ALK PHOS: 75 U/L (ref 38–126)
ALT: 46 U/L (ref 14–54)
AST: 39 U/L (ref 15–41)
Albumin: 4.3 g/dL (ref 3.5–5.0)
Anion gap: 6 (ref 5–15)
BILIRUBIN TOTAL: 0.5 mg/dL (ref 0.3–1.2)
BUN: 16 mg/dL (ref 6–20)
CALCIUM: 9.4 mg/dL (ref 8.9–10.3)
CO2: 27 mmol/L (ref 22–32)
CREATININE: 0.84 mg/dL (ref 0.44–1.00)
Chloride: 108 mmol/L (ref 101–111)
Glucose, Bld: 138 mg/dL — ABNORMAL HIGH (ref 65–99)
Potassium: 4.4 mmol/L (ref 3.5–5.1)
SODIUM: 141 mmol/L (ref 135–145)
Total Protein: 6.7 g/dL (ref 6.5–8.1)

## 2016-07-20 NOTE — Discharge Summary (Signed)
Physician Discharge Summary  [name] [id number] DOB: [birth year] DOA: 07/19/2016  PCP: Jana Half  Admit date: 07/19/2016 Discharge date: 07/20/2016  Recommendations for Outpatient Follow-up:  1.  (include homehealth, outpatient follow-up instructions, specific recommendations for PCP to follow-up on, etc.)  Follow-up Information    Alexis Skinner, MD. Go on 08/12/2016.   Specialty:  General Surgery Why:  at 10:00 AM for post-op check Contact information: [address] [place] [address] [phone]        Luke Aaron Kinsinger, MD Follow up.   Specialty:  General Surgery Contact information: [place] [address] [phone]          Discharge Diagnoses:  Active Problems:   Obesity   Surgical Procedure: Laparoscopic Sleeve Gastrectomy, upper endoscopy  Discharge Condition: Good Disposition: Home  Diet recommendation: Postoperative sleeve gastrectomy diet (liquids only)  Filed Weights   07/19/16 0604  Weight: 112.1 kg (247 lb 2 oz)     Hospital Course:  The patient was admitted after undergoing laparoscopic sleeve gastrectomy. POD 0 she ambulated well. POD 1 she was started on the water diet protocol and tolerated >400 ml in the first shift. Once meeting the water amount she was advanced to bariatric protein shakes which they tolerated and were discharged home POD 1.  Treatments: surgery: laparoscopic sleeve gastrectomy  Discharge Instructions  Discharge Instructions    Call MD for:  difficulty breathing, headache or visual disturbances    Complete by:  As directed    Call MD for:  persistant nausea and vomiting    Complete by:  As directed    Call MD for:  redness, tenderness, or signs of infection (pain, swelling, redness, odor or green/yellow discharge around incision site)    Complete by:  As directed    Call MD for:  severe uncontrolled pain    Complete by:  As directed    Call  MD for:  temperature >100.4    Complete by:  As directed    Discharge wound care:    Complete by:  As directed    [place] to shower tomorrow  Glue will likely peel off in 1-3 weeks   Increase activity slowly    Complete by:  As directed    Lifting restrictions    Complete by:  As directed    Do not lift more than 20 pounds for 3-4 weeks       Medication List    STOP taking these medications   aspirin 81 MG tablet     TAKE these medications   albuterol 108 (90 Base) MCG/ACT inhaler Commonly known as:  PROVENTIL HFA;VENTOLIN HFA Inhale 1-2 puffs into the lungs every 6 (six) hours as needed for wheezing or shortness of breath.   amitriptyline 25 MG tablet Commonly known as:  ELAVIL Take 25 mg by mouth at bedtime as needed for sleep.   atorvastatin 20 MG tablet Commonly known as:  LIPITOR Take 20 mg by mouth daily. Patient stopped on 07/12/2016   Calcium 500 MG Chew Chew 500 mg by mouth daily.   fluticasone 50 MCG/ACT nasal spray Commonly known as:  FLONASE Place 1 spray into the nose 2 (two) times daily as needed (for allergies/throat irritation).   furosemide 20 MG tablet Commonly known as:  LASIX Take 20 mg by mouth daily. Stopped on 07/11/2016 Notes to patient:  Monitor Blood Pressure Daily and keep a log for primary care physician.  Monitor for  symptoms of dehydration.  You may need to make changes to your medications with rapid weight loss.     gabapentin 300 MG capsule Commonly known as:  NEURONTIN Take 1 capsule (300 mg total) by mouth 3 (three) times daily. What changed:  when to take this   HM SUPER VITAMIN B12 2500 MCG Chew Generic drug:  Cyanocobalamin Chew 2,500 mcg by mouth daily.   HYDROcodone-acetaminophen 7.5-325 MG tablet Commonly known as:  NORCO Take 1 tablet by mouth 3 (three) times daily. Notes to patient:  Do not take this medication with the pain medication prescribed by your surgeon   linaclotide 290 MCG Caps capsule Commonly known as:   LINZESS Take 1 capsule (290 mcg total) by mouth daily.   MOVANTIK 25 MG Tabs tablet Generic drug:  naloxegol oxalate TAKE 1 TABLET BY MOUTH EVERY DAY 1 HOUR BEFORE A MEAL   MULTIVITAMIN GUMMIES WOMENS Chew Chew 1 tablet by mouth daily. Women's Ultra Mega Soft Chewable   omega-3 acid ethyl esters 1 g capsule Commonly known as:  LOVAZA Take 1 g by mouth 2 (two) times daily. Notes to patient:  You may want to look into [employer], per the capsule is smaller than traditional fish oil   ondansetron 4 MG disintegrating tablet Commonly known as:  ZOFRAN-ODT Take 4 mg by mouth every 6 (six) hours as needed for nausea/vomiting.   oxyCODONE 5 MG/5ML solution Commonly known as:  ROXICODONE Take 5-10 mLs by mouth every 4 (four) hours as needed for pain.   pantoprazole 40 MG tablet Commonly known as:  PROTONIX Take 1 tablet (40 mg total) by mouth daily.   valsartan 160 MG tablet Commonly known as:  DIOVAN Take 160 mg by mouth daily. As of 07/12/2016 patient is taking only 80 mg in the am Notes to patient:  Monitor Blood Pressure Daily and keep a log for primary care physician.  You may need to make changes to your medications with rapid weight loss.     varenicline 1 MG tablet Commonly known as:  CHANTIX Take 1 mg by mouth 2 (two) times daily.      Follow-up Information    Alexis Skinner, MD. Go on 08/12/2016.   Specialty:  General Surgery Why:  at 10:00 AM for post-op check Contact information: [address] [place] [address] [phone]        Luke Aaron Kinsinger, MD Follow up.   Specialty:  General Surgery Contact information: [place] [address] [phone]            The results of significant diagnostics from this hospitalization (including imaging, microbiology, ancillary and laboratory) are listed below for reference.    Significant Diagnostic Studies: No results found.  Labs: Basic Metabolic  Panel:  Recent Labs Lab 07/19/16 0954 07/20/16 0449  NA  --  141  K  --  4.4  CL  --  108  CO2  --  27  GLUCOSE  --  138*  BUN  --  16  CREATININE 0.96 0.84  CALCIUM  --  9.4   Liver Function Tests:  Recent Labs Lab 07/20/16 0449  AST 39  ALT 46  ALKPHOS 75  BILITOT 0.5  PROT 6.7  ALBUMIN 4.3    CBC:  Recent Labs Lab 07/19/16 0954 07/20/16 0449  WBC 10.9* 11.6*  NEUTROABS  --  9.9*  HGB 13.7  13.8 12.5  HCT 40.9  42.3 38.1  MCV 91.3 92.9  PLT 197 204    CBG: No results for input(s): GLUCAP in the last 168 hours.  Active Problems:   Obesity   Time coordinating discharge: <30min

## 2016-07-20 NOTE — Progress Notes (Signed)
Pt was given discharge instructions all questions were answered and she was taken to the front lobby to meet her daughter to go home via wheelchair. [place]

## 2016-07-20 NOTE — Progress Notes (Signed)
Patient alert and oriented, pain is controlled. Patient is tolerating fluids, advanced to protein shake today, patient is tolerating well.  Reviewed Gastric sleeve discharge instructions with patient and patient is able to articulate understanding.  Provided information on BELT program, Support Group and [facility] outpatient pharmacy. All questions answered, will continue to monitor.  

## 2016-07-20 NOTE — Plan of Care (Signed)
Problem: Food- and Nutrition-Related Knowledge Deficit (NB-1.1) Goal: Nutrition education Formal process to instruct or train a patient/client in a skill or to impart knowledge to help patients/clients voluntarily manage or modify food choices and eating behavior to maintain or improve health. Outcome: Completed/Met Date Met: 07/20/16 Nutrition Education Note  Received consult for diet education per DROP protocol.   Discussed 2 week post op diet with pt. Emphasized that liquids must be non carbonated, non caffeinated, and sugar free. Fluid goals discussed. Reviewed progression of diet to include soft proteins at 7-10 days post-op. Pt to follow up with outpatient bariatric RD for further diet progression after 2 weeks. Multivitamins and minerals also reviewed. Teach back method used, pt expressed understanding, expect good compliance.   Diet: First 2 Weeks  You will see the dietitian about two (2) weeks after your surgery. The dietitian will increase the types of foods you can eat if you are handling liquids well:  If you have severe vomiting or nausea and cannot handle clear liquids lasting longer than 1 day, call your surgeon  Protein Shake  Drink at least 2 ounces of shake 5-6 times per day  Each serving of protein shakes (usually 8 - 12 ounces) should have a minimum of:  15 grams of protein  And no more than 5 grams of carbohydrate  Goal for protein each day:  Men = 80 grams per day  Women = 60 grams per day  Protein powder may be added to fluids such as non-fat milk or Lactaid milk or Soy milk (limit to 35 grams added protein powder per serving)   Hydration  Slowly increase the amount of water and other clear liquids as tolerated (See Acceptable Fluids)  Slowly increase the amount of protein shake as tolerated  Sip fluids slowly and throughout the day  May use sugar substitutes in small amounts (no more than 6 - 8 packets per day; i.e. Splenda)   Fluid Goal  The first goal is to  drink at least 8 ounces of protein shake/drink per day (or as directed by the nutritionist); some examples of protein shakes are [employer], [employer], EAS Edge HP, and Unjury. See handout from pre-op Bariatric Education Class:  Slowly increase the amount of protein shake you drink as tolerated  You may find it easier to slowly sip shakes throughout the day  It is important to get your proteins in first  Your fluid goal is to drink 64 - 100 ounces of fluid daily  It may take a few weeks to build up to this  32 oz (or more) should be clear liquids  And  32 oz (or more) should be full liquids (see below for examples)  Liquids should not contain sugar, caffeine, or carbonation   Clear Liquids:  Water or Sugar-free flavored water (i.e. Fruit H2O, Propel)  Decaffeinated coffee or tea (sugar-free)  Crystal Lite, Wyler's Lite, Minute Maid Lite  Sugar-free Jell-O  Bouillon or broth  Sugar-free Popsicle: *Less than 20 calories each; Limit 1 per day   Full Liquids:  Protein Shakes/Drinks + 2 choices per day of other full liquids  Full liquids must be:  No More Than 12 grams of Carbs per serving  No More Than 3 grams of Fat per serving  Strained low-fat cream soup  Non-Fat milk  Fat-free Lactaid Milk  Sugar-free yogurt (Dannon Lite & Fit, Greek yogurt)     Clayton Bibles, MS, RD, LDN Pager: [phone] After Hours Pager: [phone]

## 2016-07-20 NOTE — Progress Notes (Signed)
  Progress Note: Metabolic and Bariatric Surgery Service   Subjective: Minimal pain, headache overnight, no nausea, tolerating water  Objective: Vital signs in last 24 hours: Temp:  [97.5 F (36.4 C)-98.9 F (37.2 C)] 97.5 F (36.4 C) (11/28 0549) Pulse Rate:  [81-96] 93 (11/28 0549) Resp:  [14-24] 16 (11/28 0549) BP: (146-192)/(58-92) 146/70 (11/28 0549) SpO2:  [91 %-100 %] 98 % (11/28 0549) Last BM Date: 07/12/16  Intake/Output from previous day: 11/27 0701 - 11/28 0700 In: 2345 [I.V.:2345] Out: 3275 [Urine:3250; Blood:25] Intake/Output this shift: No intake/output data recorded.  Lungs: CTAB  Cardiovascular: RRR  Abd: soft, NT, ND, incisions c/d/di  Extremities: no edema  Neuro: AOx4  Lab Results: CBC   Recent Labs  07/19/16 0954 07/20/16 0449  WBC 10.9* 11.6*  HGB 13.7  13.8 12.5  HCT 40.9  42.3 38.1  PLT 197 204   BMET  Recent Labs  07/19/16 0954 07/20/16 0449  NA  --  141  K  --  4.4  CL  --  108  CO2  --  27  GLUCOSE  --  138*  BUN  --  16  CREATININE 0.96 0.84  CALCIUM  --  9.4   PT/INR No results for input(s): LABPROT, INR in the last 72 hours. ABG No results for input(s): PHART, HCO3 in the last 72 hours.  Invalid input(s): PCO2, PO2  Studies/Results:  Anti-infectives: Anti-infectives    Start     Dose/Rate Route Frequency Ordered Stop   07/19/16 0513  cefoTEtan in Dextrose 5% (CEFOTAN) IVPB 2 g     2 g Intravenous On call to O.R. 07/19/16 0513 07/19/16 0730   07/19/16 0513  cefoTEtan in Dextrose 5% (CEFOTAN) IVPB 2 g  Status:  Discontinued     2 g Intravenous On call to O.R. 07/19/16 0513 07/19/16 0514      Medications: Scheduled Meds: . enoxaparin (LOVENOX) injection  30 mg Subcutaneous Q12H  . [START ON 07/21/2016] protein supplement shake  2 oz Oral Q2H   Continuous Infusions: . sodium chloride 75 mL/hr at 07/19/16 2204   PRN Meds:.oxyCODONE **AND** acetaminophen, acetaminophen (TYLENOL) oral liquid 160 mg/5 mL,  albuterol, diphenhydrAMINE, HYDROmorphone (DILAUDID) injection, ondansetron (ZOFRAN) IV  Assessment/Plan: Patient Active Problem List   Diagnosis Date Noted  . Obesity 07/19/2016  . Hereditary and idiopathic peripheral neuropathy 03/10/2015  . Hip fracture, left ([place]) 10/05/2013  . Intertrochanteric fracture of left femur ([place]) 10/04/2013  . Hip fracture ([place]) 10/04/2013  . Hypertension   . Chronic back pain   . Peptic ulcer   . High cholesterol   . Arthritis of left knee 06/11/2013  . Constipation, chronic 05/21/2013  . Abdominal pain, left lower quadrant 05/21/2013  . Diverticulosis of colon without hemorrhage 05/21/2013  . Abnormal weight loss 05/21/2013  . Early satiety 05/21/2013  . Osteoarthritis of right knee 01/28/2013   s/p Procedure(s): LAPAROSCOPIC GASTRIC SLEEVE RESECTION WITH HIATAL HERNIA REPAIR 07/19/2016 -POD 1 diet -potential for POD 1 discharge  Disposition:  LOS: 1 day  The patient will be in the hospital for normal postop protocol  Mickeal Skinner, MD [phone] [facility]

## 2016-07-28 ENCOUNTER — Other Ambulatory Visit: Payer: Self-pay | Admitting: Gastroenterology

## 2016-08-03 ENCOUNTER — Encounter: Payer: BLUE CROSS/BLUE SHIELD | Attending: General Surgery | Admitting: Dietician

## 2016-08-03 DIAGNOSIS — Z713 Dietary counseling and surveillance: Secondary | ICD-10-CM | POA: Diagnosis not present

## 2016-08-03 DIAGNOSIS — Z6841 Body Mass Index (BMI) 40.0 and over, adult: Secondary | ICD-10-CM | POA: Insufficient documentation

## 2016-08-04 NOTE — Progress Notes (Signed)
Bariatric Class:  Appt start time: 1530 end time:  1630.  2 Week Post-Operative Nutrition Class  Patient was seen on 08/03/2016 for Post-Operative Nutrition [facility].   Surgery date: 07/19/2016 Surgery type: sleeve gastrectomy Start weight at [facility] 254 lbs on 12/16/2015, 255.6 lbs on 07/05/16 Weight today: 232.2 lbs  Weight change: 23.4 lbs  TANITA  BODY COMP RESULTS  07/05/16 08/03/16   BMI (kg/m^2) 41.3 37.5   Fat Mass (lbs) 122.2 112.0   Fat Free Mass (lbs) 133.4 120.2   Total Body Water (lbs) 97.2 87.0   The following the learning objectives were met by the patient during this course:  Identifies Phase 3A (Soft, High Proteins) Dietary Goals and will begin from 2 weeks post-operatively to 2 months post-operatively  Identifies appropriate sources of fluids and proteins   States protein recommendations and appropriate sources post-operatively  Identifies the need for appropriate texture modifications, mastication, and bite sizes when consuming solids  Identifies appropriate multivitamin and calcium sources post-operatively  Describes the need for physical activity post-operatively and will follow MD recommendations  States when to call healthcare provider regarding medication questions or post-operative complications  Handouts given during class include:  Phase 3A: Soft, High Protein Diet Handout  Follow-Up Plan: Patient will follow-up at [facility] in 6 weeks for 2 month post-op nutrition visit for diet advancement per MD.

## 2016-09-15 ENCOUNTER — Encounter: Payer: BLUE CROSS/BLUE SHIELD | Attending: General Surgery | Admitting: Dietician

## 2016-09-15 ENCOUNTER — Encounter: Payer: Self-pay | Admitting: Dietician

## 2016-09-15 DIAGNOSIS — E669 Obesity, unspecified: Secondary | ICD-10-CM

## 2016-09-15 DIAGNOSIS — Z6841 Body Mass Index (BMI) 40.0 and over, adult: Secondary | ICD-10-CM | POA: Insufficient documentation

## 2016-09-15 DIAGNOSIS — Z713 Dietary counseling and surveillance: Secondary | ICD-10-CM | POA: Diagnosis not present

## 2016-09-15 NOTE — Progress Notes (Signed)
  Follow-up visit:  8 Weeks Post-Operative sleeve gastrectomy Surgery  Medical Nutrition Therapy:  Appt start time: [id number] end time:  [id number].  Primary concerns today: Post-operative Bariatric Surgery Nutrition Management. [name] returns having lost a total of 38 pounds. She reports that she is feeling good and happy with her weight loss. Excited to add vegetables. Checks her blood pressure at home and states it has been normal. Drinks Corporate investment banker. Cannot afford bariatric-specific supplements and has been taking gummy vitamins. She agrees to switch to 2 Flintstones Complete multivitamins per day and follow up for post op vitamin labs.  Surgery date: 07/19/2016 Surgery type: sleeve gastrectomy Start weight at [facility]: 254 lbs on 12/16/2015, 255.6 lbs on 07/05/16 Weight today: 219.2 lbs Weight change: 13 lbs Total weight lost: 38 lbs Weight loss goal: 180 lbs  TANITA  BODY COMP RESULTS  07/05/16 08/03/16 09/15/16   BMI (kg/m^2) 41.3 37.5 35.4   Fat Mass (lbs) 122.2 112.0 100.6   Fat Free Mass (lbs) 133.4 120.2 118.6   Total Body Water (lbs) 97.2 87.0 85.2    Preferred Learning Style:  No preference indicated   Learning Readiness:   Ready  24-hr recall: B (7AM): boiled egg (6g) Snk (10AM): Triple zero yogurt (15g)  L (12PM): 1.5 oz chicken or fish (10g)  Snk (2PM): yogurt or meat and cheese (10-15g)  D (PM): hamburger patty and cottage cheese (14-18g) Snk (PM):   4 premier clear per day (80g)  Fluid intake: 4 Premier Clear per day, 4 oz decaf coffee, four 17-oz water bottles Estimated total protein intake: 120+ grams/day  Medications: see list Supplementation: taking  Drinking while eating: no, sometimes sips Hair loss: some thinning Carbonated beverages: no N/V/D/C: constipation, taking liquid Magnesium Dumping syndrome: none  Recent physical activity:  3x a week; cardio and weight lifting  Progress Towards Goal(s):  In progress.  Handouts given during visit  include:  Phase 3B lean protein + non starchy vegetables   Nutritional Diagnosis:  [place]-3.3 Overweight/obesity related to past poor dietary habits and physical inactivity as evidenced by patient w/ recent sleeve gastrectomy surgery following dietary guidelines for continued weight loss.     Intervention:  Nutrition counseling provided.  Teaching Method Utilized:  Visual Auditory Hands on  Barriers to learning/adherence to lifestyle change: none  Demonstrated degree of understanding via:  Teach Back   Monitoring/Evaluation:  Dietary intake, exercise, and body weight. Follow up in 2 months for 4 month post-op visit.

## 2016-09-15 NOTE — Patient Instructions (Addendum)
Goals:  Follow Phase 3B: High Protein + Non-Starchy Vegetables  Eat 3-6 small meals/snacks, every 3-5 hrs  Increase lean protein foods to meet 60g goal  Increase fluid intake to 64oz +  Avoid drinking 15 minutes before, during and 30 minutes after eating  Aim for >30 min of physical activity daily  Cut down to 1-2 Premier Clear drinks per day  Try Flintstones Complete (2 per day)  Don't take with Calcium   Surgery date: 07/19/2016 Surgery type: sleeve gastrectomy [facility] weight at [facility]: 254 lbs on 12/16/2015, 255.6 lbs on 07/05/16 Weight today: 219.2 lbs Weight change: 13 lbs Total weight lost: 38 lbs Weight loss goal: 180 lbs  TANITA  BODY COMP RESULTS  07/05/16 08/03/16 09/15/16   BMI (kg/m^2) 41.3 37.5 35.4   Fat Mass (lbs) 122.2 112.0 100.6   Fat Free Mass (lbs) 133.4 120.2 118.6   Total Body Water (lbs) 97.2 87.0 85.2

## 2017-04-14 ENCOUNTER — Other Ambulatory Visit (HOSPITAL_COMMUNITY): Payer: Self-pay | Admitting: Family Medicine

## 2017-04-14 DIAGNOSIS — Z1231 Encounter for screening mammogram for malignant neoplasm of breast: Secondary | ICD-10-CM

## 2017-04-20 ENCOUNTER — Ambulatory Visit (HOSPITAL_COMMUNITY)
Admission: RE | Admit: 2017-04-20 | Discharge: 2017-04-20 | Disposition: A | Discharge status: Home / Self Care | Payer: BLUE CROSS/BLUE SHIELD | Source: Ambulatory Visit | Attending: Family Medicine | Admitting: Family Medicine

## 2017-04-20 DIAGNOSIS — Z1231 Encounter for screening mammogram for malignant neoplasm of breast: Secondary | ICD-10-CM | POA: Diagnosis present

## 2017-07-21 ENCOUNTER — Ambulatory Visit: Payer: Self-pay

## 2017-07-21 ENCOUNTER — Encounter: Payer: Self-pay | Admitting: Podiatry

## 2017-07-21 ENCOUNTER — Ambulatory Visit: Payer: BLUE CROSS/BLUE SHIELD | Admitting: Podiatry

## 2017-07-21 VITALS — BP 180/100 | HR 80 | Ht 66.0 in | Wt 186.0 lb

## 2017-07-21 DIAGNOSIS — M79672 Pain in left foot: Principal | ICD-10-CM

## 2017-07-21 DIAGNOSIS — M79671 Pain in right foot: Secondary | ICD-10-CM | POA: Diagnosis not present

## 2017-07-21 DIAGNOSIS — M069 Rheumatoid arthritis, unspecified: Secondary | ICD-10-CM | POA: Diagnosis not present

## 2017-07-21 NOTE — Progress Notes (Signed)
Subjective:    Patient ID: [name], female    DOB: 1955/10/24, 60 y.o.   MRN: [id number]   Chief Complaint  Patient presents with  . Difficulty Walking    Bilateral toes separating over time - causing balance issues   61 y.o. female presents with the above complaint.  States that her great toes are separating over time worse on the right side.  Nondiabetic but endorses neuropathy of unknown cause.  Also reports bilateral elongated thickened toenails.  States that she had a history of bariatric surgery 1 year ago and she is lost a significant amount of weight Past Medical History:  Diagnosis Date  . Arthritis   . Cancer ([place]) 1992   cervical  . Chronic back pain   . COPD (chronic obstructive pulmonary disease) ([place])   . Diverticulosis   . Fluid retention   . GERD (gastroesophageal reflux disease)   . High cholesterol   . Hypertension   . Hypokalemia   . Peptic ulcer   . Peripheral neuropathy   . Pneumonia    hospitalized in March 2016   . PONV (postoperative nausea and vomiting)   . Pre-diabetes    Past Surgical History:  Procedure Laterality Date  . ABDOMINAL HYSTERECTOMY    . CHOLECYSTECTOMY    . COLONOSCOPY WITH ESOPHAGOGASTRODUODENOSCOPY (EGD) N/A 06/08/2013   WUJ:[id number] antral ulcer status post biopsy (benign). Hiatal hernia/ Tortuous colon. Internal hemorrhoids. Colonic diverticulosis. Single colonic polyp removed as described above (too small to process). Next TCS 05/2023  . ESOPHAGOGASTRODUODENOSCOPY N/A 05/20/2014   FAO:ZHYQMV ulcer ranging between 3-85mm in size was found in the gastric antrum with satellite erosions. Status post ulcer body. Likely NSAID effect.  . INTRAMEDULLARY (IM) NAIL INTERTROCHANTERIC Left 10/05/2013   Procedure: ORIF LEFT HIP  INTERTROCHANTRIC FRACTURE;  Surgeon: [name], MD;  Location: [facility];  Service: Orthopedics;  Laterality: Left;  . KNEE SURGERY     right and left  . LAPAROSCOPIC GASTRIC SLEEVE RESECTION WITH HIATAL  HERNIA REPAIR N/A 07/19/2016   Procedure: LAPAROSCOPIC GASTRIC SLEEVE RESECTION WITH HIATAL HERNIA REPAIR;  Surgeon: Arta Bruce Kinsinger, MD;  Location: [facility];  Service: General;  Laterality: N/A;  . TOTAL KNEE ARTHROPLASTY Right 01/29/2013   Procedure: TOTAL KNEE ARTHROPLASTY WITH HARDWARE REMOVAL;  Surgeon: [name], MD;  Location: [facility];  Service: Orthopedics;  Laterality: Right;  DEPUY/SIGMA RP, SYNTHES SCREWDRIVERS  . TOTAL KNEE ARTHROPLASTY Left 06/11/2013   Dr Mayer Camel  . TOTAL KNEE ARTHROPLASTY Left 06/11/2013   Procedure: TOTAL KNEE ARTHROPLASTY;  Surgeon: [name], MD;  Location: [facility];  Service: Orthopedics;  Laterality: Left;    Current Outpatient Medications:  .  albuterol (PROVENTIL HFA;VENTOLIN HFA) 108 (90 Base) MCG/ACT inhaler, Inhale 1-2 puffs into the lungs every 6 (six) hours as needed for wheezing or shortness of breath., Disp: , Rfl:  .  amitriptyline (ELAVIL) 25 MG tablet, Take 25 mg by mouth at bedtime as needed for sleep. , Disp: , Rfl:  .  atorvastatin (LIPITOR) 20 MG tablet, Take 20 mg by mouth daily. Patient stopped on 07/12/2016, Disp: , Rfl: 2 .  Calcium 500 MG CHEW, Chew 500 mg by mouth daily., Disp: , Rfl:  .  Cyanocobalamin (HM SUPER VITAMIN B12) 2500 MCG CHEW, Chew 2,500 mcg by mouth daily., Disp: , Rfl:  .  fluticasone (FLONASE) 50 MCG/ACT nasal spray, Place 1 spray into the nose 2 (two) times daily as needed (for allergies/throat irritation). , Disp: , Rfl:  .  furosemide (LASIX) 20 MG tablet, Take 20 mg by mouth daily. Stopped on 07/11/2016, Disp: , Rfl:  .  gabapentin (NEURONTIN) 300 MG capsule, Take 1 capsule (300 mg total) by mouth 3 (three) times daily. (Patient taking differently: Take 300 mg by mouth 4 (four) times daily. ), Disp: 90 capsule, Rfl: 11 .  HYDROcodone-acetaminophen (NORCO) 7.5-325 MG tablet, Take 1 tablet by mouth 3 (three) times daily., Disp: , Rfl: 0 .  Linaclotide (LINZESS) 290 MCG CAPS capsule, Take 1 capsule (290 mcg total) by  mouth daily. (Patient not taking: Reported on 07/21/2017), Disp: 30 capsule, Rfl: 11 .  MOVANTIK 25 MG TABS tablet, TAKE 1 TABLET BY MOUTH EVERY DAY 1 HOUR BEFORE A MEAL, Disp: 30 tablet, Rfl: 1 .  Multiple Vitamins-Minerals (MULTIVITAMIN GUMMIES WOMENS) CHEW, Chew 1 tablet by mouth daily. Women's Ultra Mega Soft Chewable, Disp: , Rfl:  .  omega-3 acid ethyl esters (LOVAZA) 1 g capsule, Take 1 g by mouth 2 (two) times daily., Disp: , Rfl: 10 .  ondansetron (ZOFRAN-ODT) 4 MG disintegrating tablet, Take 4 mg by mouth every 6 (six) hours as needed for nausea/vomiting., Disp: , Rfl: 0 .  oxyCODONE (ROXICODONE) 5 MG/5ML solution, Take 5-10 mLs by mouth every 4 (four) hours as needed for pain., Disp: , Rfl: 0 .  pantoprazole (PROTONIX) 40 MG tablet, TAKE 1 TABLET BY MOUTH EVERY DAY (Patient not taking: Reported on 07/21/2017), Disp: 30 tablet, Rfl: 11 .  valsartan (DIOVAN) 160 MG tablet, Take 160 mg by mouth daily. As of 07/12/2016 patient is taking only 80 mg in the am, Disp: , Rfl:  .  varenicline (CHANTIX) 1 MG tablet, Take 1 mg by mouth 2 (two) times daily., Disp: , Rfl:   Allergies  Allergen Reactions  . Clindamycin/Lincomycin Swelling    Swelling feet  . Codeine Itching  . Levaquin [Levofloxacin] Swelling  . Nsaids     Needs to avoid d/t peptic ulcer    Review of Systems  All other systems reviewed and are negative.     Objective:   Physical Exam Vitals:   07/21/17 1349  BP: (!) 180/100  Pulse: 80   General AA&O x3. Normal mood and affect.  Vascular Dorsalis pedis and posterior tibial pulses  present 2+ bilaterally  Capillary refill normal to all digits. Pedal hair growth normal.  Neurologic Epicritic sensation grossly present.  Dermatologic No open lesions. Interspaces clear of maceration. Nails well groomed and normal in appearance.  Orthopedic: MMT 5/5 in dorsiflexion, plantarflexion, inversion, and eversion. 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  Lateral  deviation of digits of left foot   X-rays taken and reviewed.  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  Elongated second third metatarsals    Assessment & Plan:  Was evaluated and treated all questions answered  Hallux valgus with rheumatoid foot type -Due to family history of rheumatoid arthritis arthritic panel ordered will review at next visit -Padding dispensed -Discussed that she could benefit from surgical correction however would consider workup of the neuropathic cause prior to surgery  Neuropathy with balance issues -Referred to PT for balance training -Refer to orthotist for balance bracing  Return in about 5 weeks (around 08/25/2017) for lab work f/u `.

## 2017-07-26 LAB — CBC WITH DIFFERENTIAL/PLATELET
BASOS ABS: 52 {cells}/uL (ref 0–200)
Basophils Relative: 0.6 %
EOS ABS: 139 {cells}/uL (ref 15–500)
EOS PCT: 1.6 %
HCT: 40.1 % (ref 35.0–45.0)
Hemoglobin: 13.6 g/dL (ref 11.7–15.5)
Lymphs Abs: 2888 cells/uL (ref 850–3900)
MCH: 31.3 pg (ref 27.0–33.0)
MCHC: 33.9 g/dL (ref 32.0–36.0)
MCV: 92.2 fL (ref 80.0–100.0)
MONOS PCT: 5.6 %
MPV: 10.2 fL (ref 7.5–12.5)
NEUTROS ABS: 5133 {cells}/uL (ref 1500–7800)
NEUTROS PCT: 59 %
PLATELETS: 211 10*3/uL (ref 140–400)
RBC: 4.35 10*6/uL (ref 3.80–5.10)
RDW: 12.1 % (ref 11.0–15.0)
TOTAL LYMPHOCYTE: 33.2 %
WBC: 8.7 10*3/uL (ref 3.8–10.8)
WBCMIX: 487 {cells}/uL (ref 200–950)

## 2017-07-26 LAB — RHEUMATOID FACTOR

## 2017-07-26 LAB — C-REACTIVE PROTEIN: CRP: 8 mg/L — ABNORMAL HIGH (ref <8.0)

## 2017-07-26 LAB — URIC ACID: Uric Acid, Serum: 3.8 mg/dL (ref 2.5–7.0)

## 2017-07-26 LAB — SEDIMENTATION RATE: SED RATE: 17 mm/h (ref 0–30)

## 2017-07-26 LAB — HLA-B27 ANTIGEN: HLA-B27 Antigen: NEGATIVE

## 2017-07-26 LAB — ANA: Anti Nuclear Antibody(ANA): NEGATIVE

## 2017-08-01 ENCOUNTER — Other Ambulatory Visit: Payer: BLUE CROSS/BLUE SHIELD | Admitting: Orthotics

## 2017-08-10 ENCOUNTER — Ambulatory Visit: Payer: BLUE CROSS/BLUE SHIELD | Admitting: Orthotics

## 2017-08-10 DIAGNOSIS — M069 Rheumatoid arthritis, unspecified: Secondary | ICD-10-CM

## 2017-08-10 NOTE — Progress Notes (Signed)
Patient came in for evaluation/casting balance bracing; after discussion with the patient, it has been deteremined she would not be ideal candidate for MBB:  She doesn't have hx of falling, nor does she fear falling.  She does have some gait instability, but that is more than likely due to leg length discrencpy secondary to hip surgery.

## 2017-08-11 ENCOUNTER — Ambulatory Visit: Payer: BLUE CROSS/BLUE SHIELD | Admitting: Podiatry

## 2017-08-25 ENCOUNTER — Ambulatory Visit: Payer: BLUE CROSS/BLUE SHIELD | Admitting: Podiatry

## 2017-08-25 DIAGNOSIS — M21961 Unspecified acquired deformity of right lower leg: Secondary | ICD-10-CM | POA: Diagnosis not present

## 2017-08-25 DIAGNOSIS — M2041 Other hammer toe(s) (acquired), right foot: Secondary | ICD-10-CM

## 2017-08-25 DIAGNOSIS — M21611 Bunion of right foot: Secondary | ICD-10-CM | POA: Diagnosis not present

## 2017-08-25 DIAGNOSIS — M21621 Bunionette of right foot: Secondary | ICD-10-CM | POA: Diagnosis not present

## 2017-08-25 DIAGNOSIS — M779 Enthesopathy, unspecified: Secondary | ICD-10-CM

## 2017-08-25 DIAGNOSIS — M2011 Hallux valgus (acquired), right foot: Secondary | ICD-10-CM

## 2017-09-26 ENCOUNTER — Telehealth: Payer: Self-pay | Admitting: *Deleted

## 2017-09-26 NOTE — Telephone Encounter (Signed)
"  I want to find out if my surgery is going to be scheduled and if my insurance company has approved it.  Give me a call back to let me know.  I had told the doctor that March would be good for me to have the surgery.  So I'm wondering if any type of scheduling needs to be made or if there's any cost to me at all.  Give me a call back.  I hope you have a blessed day."

## 2017-09-27 NOTE — Telephone Encounter (Signed)
"  I'm calling back.  I'm just wondering when my surgery is going to be scheduled for March.  I need to know what time so I can let my job know.  Give me a call back.  I'd appreciate it.  Thank you very much and I hope you have a blessed day."

## 2017-09-28 NOTE — Telephone Encounter (Signed)
I left the patient a message that we can schedule her appointment for November 02, 2017.  I asked her to call and let me know if this is not a good date.  I also informed her that [name] would give her a call back with an estimate on tomorrow.

## 2017-10-28 NOTE — Progress Notes (Signed)
  Subjective:  Patient ID: [name], female    DOB: [birth year],  MRN: [id number]  Chief Complaint  Patient presents with  . Arthritis    rheumatoid  . Peripheral Neuropathy  . Difficulty Walking   62 y.o. female returns for the above complaint.  Still having pain.  Had labs performed here for review.  Denies other issues.  States that she went and saw Velora Heckler who did not believe that she was a candidate for a balance brace.  Objective:  There were no vitals filed for this visit. General AA&O x3. Normal mood and affect.  Vascular Pedal pulses palpable.  Neurologic Epicritic sensation grossly intact.  Dermatologic No open lesions. Skin normal texture and turgor.  Orthopedic:  HAV deformity, tailor's bunion deformity right foot.  Hammertoe deformities lesser digits.  Pain to palpation about the second third MPJs dorsally and plantarly.   Assessment & Plan:  Patient was evaluated and treated and all questions answered.  R Foot Bunion, Capsulitis, Metatarsalgia, Hammertoes, Tailor's Bunion -Labs reviewed no evidence of rheumatism -Patient with continued pain and has failed all conservative therapy.  Discussed with patient surgical correction with forefoot reconstruction.  Patient wishes to proceed.  Discussed the patient performing correction of right foot bunion with possible double osteotomy, shortening osteotomies of second and third metatarsals, correction of hammertoes 2, 3, and 5, correction tailor's bunion with exostectomy versus osteotomy.  All risk benefits and alternatives of surgery discussed with patient.  No guarantees given.  Consent reviewed and signed by patient.  15 minutes of face to face time were spent with the patient. >50% of this was spent on counseling and coordination of care. Specifically discussed with patient the above diagnoses and treatment plans.  Return for after surgery.

## 2017-11-08 ENCOUNTER — Telehealth: Payer: Self-pay | Admitting: *Deleted

## 2017-11-08 NOTE — Telephone Encounter (Signed)
I am calling to see if you are supposed to be scheduled for surgery.  "I was supposed to have it done last week but my blood pressure was high.  They put me on two different medications so now my blood pressure is down."  Would you like to schedule a date for surgery?  He can do it next week, March 27.  "I can do it on April 5th.  That's my daddy's birthday."  He only does surgery on Wednesdays.  "I can't do April 3, I already have appointments that day.  How about April 10, I can do it then?"  Yes he can do it then.  "Do I need to do anything or call anyone?"  Please have your doctor send [place] a letter or note that states that your blood pressure is under control.  "Okay, I will."

## 2017-11-08 NOTE — Telephone Encounter (Signed)
"  This is [name].  I was scheduled for surgery last Wednesday on the 13th.  Unfortunately, my blood pressure was too high.  So they sent me to my doctor and said they couldn't do surgery on my right foot.  So I went to the doctors today and the medication they have given me is working real well.  My doctor at [facility] told me to go ahead and reschedule my surgery.  I thought I'd give you a call to see what I needed to do here forward.  Alright thank you and I hope you have a blessed day."  I spoke to the patient and surgery was scheduled for November 30, 2017.

## 2017-11-08 NOTE — Telephone Encounter (Signed)
"  I was wondering if you could give me a call.  I need a fax number or for you to tell me how to send everything from the doctor's office and who I'm supposed to send it to.  so I can call them and get them to do that for me.  Thank you sweetie."

## 2017-11-08 NOTE — Telephone Encounter (Signed)
I left her a message that the fax number is [phone].  Call me back if you have any further questions.

## 2017-11-08 NOTE — Telephone Encounter (Signed)
Noted. Thanks.

## 2017-11-09 ENCOUNTER — Other Ambulatory Visit: Payer: BLUE CROSS/BLUE SHIELD

## 2017-11-11 ENCOUNTER — Other Ambulatory Visit: Payer: BLUE CROSS/BLUE SHIELD

## 2017-11-30 ENCOUNTER — Telehealth: Payer: Self-pay | Admitting: Podiatry

## 2017-11-30 DIAGNOSIS — M2011 Hallux valgus (acquired), right foot: Secondary | ICD-10-CM | POA: Diagnosis not present

## 2017-11-30 DIAGNOSIS — M2042 Other hammer toe(s) (acquired), left foot: Secondary | ICD-10-CM | POA: Diagnosis not present

## 2017-11-30 DIAGNOSIS — M21541 Acquired clubfoot, right foot: Secondary | ICD-10-CM | POA: Diagnosis not present

## 2017-11-30 DIAGNOSIS — M2041 Other hammer toe(s) (acquired), right foot: Secondary | ICD-10-CM | POA: Diagnosis not present

## 2017-11-30 MED ORDER — HYDROMORPHONE HCL 2 MG PO TABS: 2.0000 mg | ORAL_TABLET | ORAL | 0 refills | Status: DC | PRN

## 2017-11-30 MED ORDER — ONDANSETRON HCL 8 MG PO TABS: 8.0000 mg | ORAL_TABLET | Freq: Three times a day (TID) | ORAL | 0 refills | Status: DC | PRN

## 2017-11-30 MED ORDER — CEPHALEXIN 500 MG PO CAPS: 500.0000 mg | ORAL_CAPSULE | Freq: Three times a day (TID) | ORAL | 0 refills | Status: DC

## 2017-12-01 ENCOUNTER — Telehealth: Payer: Self-pay | Admitting: *Deleted

## 2017-12-01 NOTE — Telephone Encounter (Signed)
I informed pt that she should sleep in the boot, and if sleeping she did not have to have elevated. I told pt that at least the 1st 72 hours she needed to ice 15 minutes/hour, the could decrease further through the 1st PO week. Pt states understanding.

## 2017-12-01 NOTE — Telephone Encounter (Signed)
Pt asked if she has to sleep in the boot.

## 2017-12-02 ENCOUNTER — Ambulatory Visit (INDEPENDENT_AMBULATORY_CARE_PROVIDER_SITE_OTHER): Payer: Self-pay | Admitting: Podiatry

## 2017-12-02 ENCOUNTER — Encounter: Payer: Self-pay | Admitting: Podiatry

## 2017-12-02 ENCOUNTER — Ambulatory Visit (INDEPENDENT_AMBULATORY_CARE_PROVIDER_SITE_OTHER): Payer: BLUE CROSS/BLUE SHIELD

## 2017-12-02 DIAGNOSIS — M21621 Bunionette of right foot: Secondary | ICD-10-CM

## 2017-12-02 DIAGNOSIS — M2041 Other hammer toe(s) (acquired), right foot: Secondary | ICD-10-CM

## 2017-12-02 DIAGNOSIS — M21611 Bunion of right foot: Secondary | ICD-10-CM

## 2017-12-02 DIAGNOSIS — M2011 Hallux valgus (acquired), right foot: Secondary | ICD-10-CM | POA: Diagnosis not present

## 2017-12-04 NOTE — Progress Notes (Signed)
  Subjective:  Patient ID: [name], female    DOB: Apr 02, 1956,  MRN: [id number]  Chief Complaint  Patient presents with  . Routine Post Op     dos 04.10.2019 Double Osteotomy Rt; Metatarsal Osteotomy 2,3 Rt; Tailors Bunionectomy Rt; Hammertoe Repair 2,3,4,5 Rt   DOS: 11/30/17 Procedure: Double Osteotomy Rt; Metatarsal Osteotomy 2,3 Rt; Tailors Bunionectomy Rt; Hammertoe Repair 2,3,4,5 Rt  62 y.o. female returns for post-op check. Denies N/V/F/Ch. Pain is controlled with current medications.  Objective:   General AA&O x3. Normal mood and affect.  Vascular Foot warm and well perfused.  Neurologic Gross sensation intact.  Dermatologic Skin healing well without signs of infection. Skin edges well coapted without signs of infection.  Orthopedic: Tenderness to palpation noted about the surgical sites.    Assessment & Plan:  Patient was evaluated and treated and all questions answered.  S/p Double Osteotomy Rt; Metatarsal Osteotomy 2,3 Rt; Tailors Bunionectomy Rt; Hammertoe Repair 2,3,4,5 Rt -XR taken and reviewed. Consistent with post-op state. HW intact. No evidence of failure. -Progressing as expected post-operatively. -Sutures: intact. -Medications refilled: none -Foot redressed.  No follow-ups on file.

## 2017-12-07 NOTE — Progress Notes (Signed)
Correction of bunion, right foot, with double osteotomy. Shortening osteotomies 2nd and 3rd metatarsals. Correction of hammertoes 2nd, 3rd, 4th and 5th toes. Correction Tailor's bunionectomy right.

## 2017-12-08 ENCOUNTER — Ambulatory Visit (INDEPENDENT_AMBULATORY_CARE_PROVIDER_SITE_OTHER): Payer: BLUE CROSS/BLUE SHIELD | Admitting: Podiatry

## 2017-12-08 DIAGNOSIS — Z9889 Other specified postprocedural states: Secondary | ICD-10-CM

## 2017-12-08 NOTE — Telephone Encounter (Signed)
Erroneous telephone encounter.

## 2017-12-15 ENCOUNTER — Ambulatory Visit (INDEPENDENT_AMBULATORY_CARE_PROVIDER_SITE_OTHER): Payer: BLUE CROSS/BLUE SHIELD | Admitting: Podiatry

## 2017-12-15 ENCOUNTER — Encounter: Payer: Self-pay | Admitting: Podiatry

## 2017-12-15 DIAGNOSIS — M21611 Bunion of right foot: Secondary | ICD-10-CM

## 2017-12-15 DIAGNOSIS — M2041 Other hammer toe(s) (acquired), right foot: Secondary | ICD-10-CM

## 2017-12-15 DIAGNOSIS — M2011 Hallux valgus (acquired), right foot: Secondary | ICD-10-CM

## 2017-12-15 DIAGNOSIS — M21621 Bunionette of right foot: Secondary | ICD-10-CM

## 2017-12-16 NOTE — Progress Notes (Signed)
  Subjective:  Patient ID: [name], female    DOB: 09-29-1955,  MRN: [id number]  Chief Complaint  Patient presents with  . Routine Post [facility] 04.05.2019 [place] Bunionectomy Rt; Hammertoe Repair 2nd Rt" my foot feels great, i have been walking on it in the boot just putting weight on my heel"    DOS: 11/30/17 Procedure: Double Osteotomy Rt; Metatarsal Osteotomy 2,3 Rt; Tailors Bunionectomy Rt; Hammertoe Repair 2,3,4,5 Rt  62 y.o. female returns for post-op check. Denies N/V/F/Ch.  Denies pain.  Endorses she has gone back to work despite being advised not to.  States she is been walking on in the boot but just on her heel.  Objective:   General AA&O x3. Normal mood and affect.  Vascular Foot warm and well perfused.  Neurologic Gross sensation intact.  Dermatologic Skin healing well without signs of infection. Skin edges well coapted without signs of infection.  No evidence of pin tract infection  Orthopedic: Tenderness to palpation noted about the surgical sites.    Assessment & Plan:  Patient was evaluated and treated and all questions answered.  S/p Double Osteotomy Rt; Metatarsal Osteotomy 2,3 Rt; Tailors Bunionectomy Rt; Hammertoe Repair 2,3,4,5 Rt -Progressing as expected post-operatively. -Sutures: intact.  Left intact for another week -Medications refilled: none -Foot redressed. -Advised to continue nonweightbearing with the assistance of her scooter.  Discussed that should she continue to walk on the foot with pins across the metatarsal phalangeal joints the pins could bend and and prove difficult for removal later and may alter correction.  Return in about 1 week (around 12/22/2017) for Post-op.  Suture removal at that time.  We will follow-up the following week for x-rays and possible removal of the pins

## 2017-12-18 NOTE — Progress Notes (Signed)
  Subjective:  Patient ID: [name], female    DOB: 1956-06-02,  MRN: [id number]  No chief complaint on file.  DOS: 11/30/17 Procedure: Double Osteotomy Rt; Metatarsal Osteotomy 2,3 Rt; Tailors Bunionectomy Rt; Hammertoe Repair 2,3,4,5 Rt  62 y.o. female returns for post-op check. Denies N/V/F/Ch. No pain. States she is doing better.  Objective:   General AA&O x3. Normal mood and affect.  Vascular Foot warm and well perfused.  Neurologic Gross sensation intact.  Dermatologic Skin healing well without signs of infection. Skin edges well coapted without signs of infection.  Orthopedic: Tenderness to palpation noted about the surgical sites.    Assessment & Plan:  Patient was evaluated and treated and all questions answered.  S/p Double Osteotomy Rt; Metatarsal Osteotomy 2,3 Rt; Tailors Bunionectomy Rt; Hammertoe Repair 2,3,4,5 Rt -Progressing as expected post-operatively. -Sutures: intact. -Medications refilled: none -Foot redressed.  Return in about 1 week (around 12/15/2017) for Post-op.

## 2017-12-23 ENCOUNTER — Ambulatory Visit (INDEPENDENT_AMBULATORY_CARE_PROVIDER_SITE_OTHER): Payer: BLUE CROSS/BLUE SHIELD | Admitting: Podiatry

## 2017-12-23 ENCOUNTER — Ambulatory Visit (INDEPENDENT_AMBULATORY_CARE_PROVIDER_SITE_OTHER): Payer: BLUE CROSS/BLUE SHIELD

## 2017-12-23 DIAGNOSIS — M2011 Hallux valgus (acquired), right foot: Secondary | ICD-10-CM

## 2017-12-23 DIAGNOSIS — M2041 Other hammer toe(s) (acquired), right foot: Secondary | ICD-10-CM | POA: Diagnosis not present

## 2017-12-23 DIAGNOSIS — M21611 Bunion of right foot: Secondary | ICD-10-CM

## 2017-12-29 ENCOUNTER — Ambulatory Visit (INDEPENDENT_AMBULATORY_CARE_PROVIDER_SITE_OTHER): Payer: BLUE CROSS/BLUE SHIELD

## 2017-12-29 ENCOUNTER — Ambulatory Visit (INDEPENDENT_AMBULATORY_CARE_PROVIDER_SITE_OTHER): Payer: BLUE CROSS/BLUE SHIELD | Admitting: Podiatry

## 2017-12-29 DIAGNOSIS — Z9889 Other specified postprocedural states: Secondary | ICD-10-CM | POA: Diagnosis not present

## 2018-01-12 ENCOUNTER — Ambulatory Visit (INDEPENDENT_AMBULATORY_CARE_PROVIDER_SITE_OTHER): Payer: BLUE CROSS/BLUE SHIELD | Admitting: Podiatry

## 2018-01-12 ENCOUNTER — Ambulatory Visit (INDEPENDENT_AMBULATORY_CARE_PROVIDER_SITE_OTHER): Payer: BLUE CROSS/BLUE SHIELD

## 2018-01-12 DIAGNOSIS — Z9889 Other specified postprocedural states: Secondary | ICD-10-CM

## 2018-01-12 DIAGNOSIS — B351 Tinea unguium: Secondary | ICD-10-CM

## 2018-01-12 DIAGNOSIS — Z79899 Other long term (current) drug therapy: Secondary | ICD-10-CM

## 2018-01-12 DIAGNOSIS — M2041 Other hammer toe(s) (acquired), right foot: Secondary | ICD-10-CM | POA: Diagnosis not present

## 2018-01-12 LAB — HEPATIC FUNCTION PANEL
AG Ratio: 1.9 (calc) (ref 1.0–2.5)
ALBUMIN MSPROF: 4.3 g/dL (ref 3.6–5.1)
ALT: 13 U/L (ref 6–29)
AST: 17 U/L (ref 10–35)
Alkaline phosphatase (APISO): 77 U/L (ref 33–130)
BILIRUBIN DIRECT: 0.1 mg/dL (ref 0.0–0.2)
GLOBULIN: 2.3 g/dL (ref 1.9–3.7)
Indirect Bilirubin: 0.3 mg/dL (calc) (ref 0.2–1.2)
TOTAL PROTEIN: 6.6 g/dL (ref 6.1–8.1)
Total Bilirubin: 0.4 mg/dL (ref 0.2–1.2)

## 2018-01-12 MED ORDER — TERBINAFINE HCL 250 MG PO TABS: 250.0000 mg | ORAL_TABLET | Freq: Every day | ORAL | 0 refills | Status: DC

## 2018-01-12 NOTE — Progress Notes (Signed)
  Subjective:  Patient ID: [name], female    DOB: 13-Feb-1956,  MRN: [id number]  Chief Complaint  Patient presents with  . Routine Post Op    doing great - she is back in regular shoes   DOS: 11/30/17 Procedure: Double Osteotomy Rt; Metatarsal Osteotomy 2,3 Rt; Tailors Bunionectomy Rt; Hammertoe Repair 2,3,4,5 Rt  62 y.o. female returns for post-op check. Denies N/V/F/Ch.  Denies pain.  Patient is doing very well pleased with the results of surgery walking in normal shoe gear without issue.  Separate complaint of discoloration of her toenails.  Like to discuss oral options Objective:   General AA&O x3. Normal mood and affect.  Vascular Foot warm and well perfused.  Neurologic Gross sensation intact.  Dermatologic Skin well-healed except for lateral fifth MPJ with small area of bleed healing Total symptom with yellow discoloration and thickening trophic changes.  Orthopedic: Tenderness to palpation noted about the surgical sites.    Assessment & Plan:  Patient was evaluated and treated and all questions answered.  S/p Double Osteotomy Rt; Metatarsal Osteotomy 2,3 Rt; Tailors Bunionectomy Rt; Hammertoe Repair 2,3,4,5 Rt -Progressing as expected post-operatively. -Wounds healing well.  Still some lateral wound healing that has to occur supernatural applied today. -X-rays taken reviewed consistent with postop state.  Osteotomy is healing well.  Hallux valgus slightly returned -Weightbearing as tolerated normal shoe gear  Return in about 1 month (around 02/09/2018) for Post-op.

## 2018-02-03 ENCOUNTER — Ambulatory Visit (HOSPITAL_COMMUNITY)
Admission: RE | Admit: 2018-02-03 | Discharge: 2018-02-03 | Disposition: A | Discharge status: Home / Self Care | Payer: BLUE CROSS/BLUE SHIELD | Source: Ambulatory Visit | Attending: Internal Medicine | Admitting: Internal Medicine

## 2018-02-03 ENCOUNTER — Other Ambulatory Visit (HOSPITAL_COMMUNITY): Payer: Self-pay | Admitting: Internal Medicine

## 2018-02-03 DIAGNOSIS — R05 Cough: Secondary | ICD-10-CM | POA: Diagnosis present

## 2018-02-03 DIAGNOSIS — M47896 Other spondylosis, lumbar region: Secondary | ICD-10-CM | POA: Insufficient documentation

## 2018-02-03 DIAGNOSIS — M47894 Other spondylosis, thoracic region: Secondary | ICD-10-CM | POA: Insufficient documentation

## 2018-02-03 DIAGNOSIS — R059 Cough, unspecified: Secondary | ICD-10-CM

## 2018-02-09 ENCOUNTER — Ambulatory Visit (INDEPENDENT_AMBULATORY_CARE_PROVIDER_SITE_OTHER): Payer: BLUE CROSS/BLUE SHIELD | Admitting: Podiatry

## 2018-02-09 DIAGNOSIS — Z9889 Other specified postprocedural states: Secondary | ICD-10-CM

## 2018-02-12 NOTE — Progress Notes (Signed)
  Subjective:  Patient ID: [name], female    DOB: 1956-07-30,  MRN: [id number]  Chief Complaint  Patient presents with  . Bunions    right - DOS 11/30/17 - doing great  PT has helped a lot  - still has a small scab on 5th toe   DOS: 11/30/17 Procedure: Double Osteotomy Rt; Metatarsal Osteotomy 2,3 Rt; Tailors Bunionectomy Rt; Hammertoe Repair 2,3,4,5 Rt  62 y.o. female returns for post-op check. Denies N/V/F/Ch.  Patient doing very well.  Usual surgery.  States that PT helped a lot.  Would like to consider surgical intervention of left foot next year  Objective:   General AA&O x3. Normal mood and affect.  Vascular Foot warm and well perfused.  Neurologic Gross sensation intact.  Dermatologic  skin well-healed except for small scab fifth toe with underlying absorbable suture  Orthopedic:  No tenderness to palpation noted about the surgical sites.  Good range of motion right hallux second toe residual hammertoe deformity however remaining toes rectus    Assessment & Plan:  Patient was evaluated and treated and all questions answered.  S/p Double Osteotomy Rt; Metatarsal Osteotomy 2,3 Rt; Tailors Bunionectomy Rt; Hammertoe Repair 2,3,4,5 Rt -Progressing as expected post-operatively. -Doing well pleased with the results of surgery.   -We will consider surgical intervention on her left foot next year. -We will fabricate orthotics for patient.  Return in about 2 months (around 04/11/2018) for Bunion, hammertoe f/u.

## 2018-02-15 ENCOUNTER — Ambulatory Visit (INDEPENDENT_AMBULATORY_CARE_PROVIDER_SITE_OTHER): Payer: BLUE CROSS/BLUE SHIELD | Admitting: Orthotics

## 2018-02-15 DIAGNOSIS — M2011 Hallux valgus (acquired), right foot: Secondary | ICD-10-CM

## 2018-02-15 DIAGNOSIS — M79671 Pain in right foot: Secondary | ICD-10-CM

## 2018-02-15 DIAGNOSIS — M79672 Pain in left foot: Secondary | ICD-10-CM

## 2018-02-15 DIAGNOSIS — Z9889 Other specified postprocedural states: Secondary | ICD-10-CM

## 2018-02-15 DIAGNOSIS — M21611 Bunion of right foot: Secondary | ICD-10-CM

## 2018-02-15 DIAGNOSIS — M21621 Bunionette of right foot: Secondary | ICD-10-CM

## 2018-02-15 DIAGNOSIS — G609 Hereditary and idiopathic neuropathy, unspecified: Secondary | ICD-10-CM

## 2018-02-15 NOTE — Progress Notes (Signed)
Patient came into today to be cast for Custom Foot Orthotics. Upon recommendation of Dr. March Rummage  Patient presents with hx of bunions lateral foot pain R Goals are forefoot cushioning taking pressure off lateral side R Plan vendor [employer]

## 2018-04-02 ENCOUNTER — Other Ambulatory Visit: Payer: Self-pay | Admitting: Podiatry

## 2018-04-13 ENCOUNTER — Ambulatory Visit: Payer: BLUE CROSS/BLUE SHIELD | Admitting: Podiatry

## 2018-04-20 ENCOUNTER — Ambulatory Visit: Payer: BLUE CROSS/BLUE SHIELD | Admitting: Podiatry

## 2018-04-20 ENCOUNTER — Ambulatory Visit (INDEPENDENT_AMBULATORY_CARE_PROVIDER_SITE_OTHER): Payer: BLUE CROSS/BLUE SHIELD

## 2018-04-20 DIAGNOSIS — M21611 Bunion of right foot: Secondary | ICD-10-CM

## 2018-04-20 DIAGNOSIS — M2011 Hallux valgus (acquired), right foot: Secondary | ICD-10-CM

## 2018-04-20 DIAGNOSIS — B351 Tinea unguium: Secondary | ICD-10-CM | POA: Diagnosis not present

## 2018-04-20 DIAGNOSIS — Z9889 Other specified postprocedural states: Secondary | ICD-10-CM

## 2018-04-20 DIAGNOSIS — Z79899 Other long term (current) drug therapy: Secondary | ICD-10-CM

## 2018-04-20 DIAGNOSIS — M9689 Other intraoperative and postprocedural complications and disorders of the musculoskeletal system: Secondary | ICD-10-CM

## 2018-04-20 MED ORDER — TERBINAFINE HCL 250 MG PO TABS: 250.0000 mg | ORAL_TABLET | Freq: Every day | ORAL | 0 refills | Status: DC

## 2018-04-21 ENCOUNTER — Other Ambulatory Visit: Payer: Self-pay | Admitting: Podiatry

## 2018-04-21 NOTE — Progress Notes (Signed)
Subjective:  Patient ID: [name], female    DOB: [birth year],  MRN: [id number]  Chief Complaint  Patient presents with  . Routine Post Op    DOS 11/30/17  right bunion repair    62 y.o. female presents with the above complaint.  Endorses that her right foot is still very swollen. Unappy that her pain seems to have come back when she stands.  Review of Systems: Negative except as noted in the HPI. Denies N/V/F/Ch.  Past Medical History:  Diagnosis Date  . Arthritis   . Cancer ([place]) 1992   cervical  . Chronic back pain   . COPD (chronic obstructive pulmonary disease) ([place])   . Diverticulosis   . Fluid retention   . GERD (gastroesophageal reflux disease)   . High cholesterol   . Hypertension   . Hypokalemia   . Peptic ulcer   . Peripheral neuropathy   . Pneumonia    hospitalized in March 2016   . PONV (postoperative nausea and vomiting)   . Pre-diabetes     Current Outpatient Medications:  .  albuterol (PROVENTIL HFA;VENTOLIN HFA) 108 (90 Base) MCG/ACT inhaler, Inhale 1-2 puffs into the lungs every 6 (six) hours as needed for wheezing or shortness of breath., Disp: , Rfl:  .  amitriptyline (ELAVIL) 25 MG tablet, Take 25 mg by mouth at bedtime as needed for sleep. , Disp: , Rfl:  .  atorvastatin (LIPITOR) 20 MG tablet, Take 20 mg by mouth daily. Patient stopped on 07/12/2016, Disp: , Rfl: 2 .  Calcium 500 MG CHEW, Chew 500 mg by mouth daily., Disp: , Rfl:  .  cephALEXin (KEFLEX) 500 MG capsule, Take 1 capsule (500 mg total) by mouth 3 (three) times daily., Disp: 21 capsule, Rfl: 0 .  Cyanocobalamin (HM SUPER VITAMIN B12) 2500 MCG CHEW, Chew 2,500 mcg by mouth daily., Disp: , Rfl:  .  fluticasone (FLONASE) 50 MCG/ACT nasal spray, Place 1 spray into the nose 2 (two) times daily as needed (for allergies/throat irritation). , Disp: , Rfl:  .  furosemide (LASIX) 20 MG tablet, Take 20 mg by mouth daily. Stopped on 07/11/2016, Disp: , Rfl:  .  gabapentin (NEURONTIN) 300 MG  capsule, Take 1 capsule (300 mg total) by mouth 3 (three) times daily. (Patient taking differently: Take 300 mg by mouth 4 (four) times daily. ), Disp: 90 capsule, Rfl: 11 .  HYDROcodone-acetaminophen (NORCO) 7.5-325 MG tablet, Take 1 tablet by mouth 3 (three) times daily., Disp: , Rfl: 0 .  HYDROmorphone (DILAUDID) 2 MG tablet, Take 1 tablet (2 mg total) by mouth every 4 (four) hours as needed for severe pain., Disp: 20 tablet, Rfl: 0 .  Linaclotide (LINZESS) 290 MCG CAPS capsule, Take 1 capsule (290 mcg total) by mouth daily. (Patient not taking: Reported on 07/21/2017), Disp: 30 capsule, Rfl: 11 .  MOVANTIK 25 MG TABS tablet, TAKE 1 TABLET BY MOUTH EVERY DAY 1 HOUR BEFORE A MEAL, Disp: 30 tablet, Rfl: 1 .  Multiple Vitamins-Minerals (MULTIVITAMIN GUMMIES WOMENS) CHEW, Chew 1 tablet by mouth daily. Women's Ultra Mega Soft Chewable, Disp: , Rfl:  .  omega-3 acid ethyl esters (LOVAZA) 1 g capsule, Take 1 g by mouth 2 (two) times daily., Disp: , Rfl: 10 .  ondansetron (ZOFRAN) 8 MG tablet, Take 1 tablet (8 mg total) by mouth every 8 (eight) hours as needed for nausea or vomiting., Disp: 20 tablet, Rfl: 0 .  ondansetron (ZOFRAN-ODT) 4 MG disintegrating tablet, Take 4 mg by mouth every  6 (six) hours as needed for nausea/vomiting., Disp: , Rfl: 0 .  oxyCODONE (ROXICODONE) 5 MG/5ML solution, Take 5-10 mLs by mouth every 4 (four) hours as needed for pain., Disp: , Rfl: 0 .  pantoprazole (PROTONIX) 40 MG tablet, TAKE 1 TABLET BY MOUTH EVERY DAY (Patient not taking: Reported on 07/21/2017), Disp: 30 tablet, Rfl: 11 .  terbinafine (LAMISIL) 250 MG tablet, Take 1 tablet (250 mg total) by mouth daily., Disp: 90 tablet, Rfl: 0 .  valsartan (DIOVAN) 160 MG tablet, Take 160 mg by mouth daily. As of 07/12/2016 patient is taking only 80 mg in the am, Disp: , Rfl:  .  varenicline (CHANTIX) 1 MG tablet, Take 1 mg by mouth 2 (two) times daily., Disp: , Rfl:   Social History   Tobacco Use  Smoking Status Former Smoker   . Packs/day: 0.50  . Years: 43.00  . Pack years: 21.50  . Types: Cigarettes  . Last attempt to quit: 06/26/2016  . Years since quitting: 1.8  Smokeless Tobacco Never Used    Allergies  Allergen Reactions  . Clindamycin/Lincomycin Swelling    Swelling feet  . Codeine Itching  . Levaquin [Levofloxacin] Swelling  . Nsaids     Needs to avoid d/t peptic ulcer   Objective:  There were no vitals filed for this visit. There is no height or weight on file to calculate BMI. Constitutional Well developed. Well nourished.  Vascular Dorsalis pedis pulses palpable bilaterally. Posterior tibial pulses palpable bilaterally. Capillary refill normal to all digits.  No cyanosis or clubbing noted. Pedal hair growth normal.  Neurologic Normal speech. Oriented to person, place, and time. Epicritic sensation to light touch grossly present bilaterally.  Dermatologic Nails well groomed and normal in appearance. No open wounds. No skin lesions. No warmth or erythema noted to either foot.  No signs of chronic infection noted  Orthopedic: Normal joint ROM without pain or crepitus bilaterally. 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  Interval recurrence of HAV deformity, question of subtle cortical erosion medial aspect of the first MPJ Assessment:   1. 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 Onychomycosis   3. Encounter for long-term (current) use of high-risk medication   4. History of bunionectomy   5. 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  HAV recurrence -X-rays taken reviewed as above -Discussed possible causes of recurrence.  Discussed that the pin coming out early of the second toe to drift dorsally and laterally pushing the  remaining toes laterally.  This in turn could have allowed the hallux to continue to drift laterally. -Possible cortical erosion noted on x-ray.  Concern for AVN vs osteomyelitis -Infectious labs ordered.  Capsulitis fifth MPJ right -Discussed possible surgical revision would consider excision of the metatarsal head.  Will hold off until definitive plan for revision of her foot.  Onychomycosis -Refill Lamisil -Reorder LFTs  Return in about 2 weeks (around 05/04/2018) for Surgical Planning visit.

## 2018-04-22 LAB — HEPATIC FUNCTION PANEL
ALBUMIN: 4.7 g/dL (ref 3.6–4.8)
ALT: 14 IU/L (ref 0–32)
AST: 19 IU/L (ref 0–40)
Alkaline Phosphatase: 73 IU/L (ref 39–117)
Bilirubin Total: 0.4 mg/dL (ref 0.0–1.2)
Bilirubin, Direct: 0.1 mg/dL (ref 0.00–0.40)
Total Protein: 6.9 g/dL (ref 6.0–8.5)

## 2018-04-22 LAB — SEDIMENTATION RATE: Sed Rate: 7 mm/hr (ref 0–40)

## 2018-04-22 LAB — CBC/DIFF AMBIGUOUS DEFAULT
BASOS ABS: 0.1 10*3/uL (ref 0.0–0.2)
Basos: 1 %
EOS (ABSOLUTE): 0.2 10*3/uL (ref 0.0–0.4)
EOS: 2 %
HEMATOCRIT: 40.6 % (ref 34.0–46.6)
Hemoglobin: 13.3 g/dL (ref 11.1–15.9)
Immature Grans (Abs): 0 10*3/uL (ref 0.0–0.1)
Immature Granulocytes: 0 %
LYMPHS ABS: 2.5 10*3/uL (ref 0.7–3.1)
Lymphs: 28 %
MCH: 30.6 pg (ref 26.6–33.0)
MCHC: 32.8 g/dL (ref 31.5–35.7)
MCV: 94 fL (ref 79–97)
MONOS ABS: 0.6 10*3/uL (ref 0.1–0.9)
Monocytes: 6 %
Neutrophils Absolute: 5.6 10*3/uL (ref 1.4–7.0)
Neutrophils: 63 %
PLATELETS: 226 10*3/uL (ref 150–450)
RBC: 4.34 x10E6/uL (ref 3.77–5.28)
RDW: 12.6 % (ref 12.3–15.4)
WBC: 8.9 10*3/uL (ref 3.4–10.8)

## 2018-04-22 LAB — SPECIMEN STATUS REPORT

## 2018-04-22 LAB — C-REACTIVE PROTEIN: CRP: 7 mg/L (ref 0–10)

## 2018-05-03 ENCOUNTER — Encounter: Payer: Self-pay | Admitting: Cardiovascular Disease

## 2018-05-03 ENCOUNTER — Ambulatory Visit: Payer: BLUE CROSS/BLUE SHIELD | Admitting: Cardiovascular Disease

## 2018-05-03 VITALS — BP 114/64 | HR 74 | Ht 66.0 in | Wt 197.0 lb

## 2018-05-03 DIAGNOSIS — E785 Hyperlipidemia, unspecified: Secondary | ICD-10-CM

## 2018-05-03 DIAGNOSIS — I10 Essential (primary) hypertension: Secondary | ICD-10-CM | POA: Diagnosis not present

## 2018-05-03 DIAGNOSIS — Z8249 Family history of ischemic heart disease and other diseases of the circulatory system: Secondary | ICD-10-CM | POA: Diagnosis not present

## 2018-05-03 NOTE — Progress Notes (Signed)
CARDIOLOGY CONSULT NOTE  Patient ID: [name] MRN: [id number] DOB/AGE: 11/16/1955 62 y.o.  Admit date: (Not on file) Primary Physician: Redmond School, MD Referring Physician: Redmond School, MD  Reason for Consultation: Family history of heart disease  HPI: [name] is a 62 y.o. female who is being seen today for the evaluation of a family history of heart disease at the request of Redmond School, MD.  Past medical history includes hypertension, tobacco abuse, and hyperlipidemia.  I reviewed labs dated 04/21/2018: Hemoglobin 13.2, white blood cells 8.9, platelets 212, BUN 17, creatinine 1.02, total cholesterol 217, triglycerides 212, HDL 41, LDL 134, TSH 2.2.  ECG performed in the office today which I ordered and personally interpreted demonstrates normal sinus rhythm with nonspecific T wave abnormalities and left posterior fascicular block.  She is doing very well today. The patient denies any symptoms of chest pain, palpitations, shortness of breath, lightheadedness, dizziness, leg swelling, orthopnea, PND, and syncope.  She exercises at [employer] 3 days/week and does up to an hour.  She used to run on a daily basis but then underwent bilateral knee replacement.  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  She lost over 100 pounds after undergoing weight reduction surgery.  Carotid Dopplers in April 2016 showed minimal plaque disease in the left internal carotid artery.  Chest x-ray on 02/03/2018 showed degenerative changes in the thoracic and lumbar spine.  She is trying to quit smoking and is using Chantix.  She used to smoke 4 packs/week and now is down to a pack per week.  She has undergone several foot surgeries for hammertoes and plans to undergo another surgery in the near future.  Family history: Mother underwent 2 percutaneous coronary interventions at the age of 13.  Her father underwent three-vessel CABG at the age of 69.  Both parents are  deceased.  Her mother died of cancer and her father died of Alzheimer's disease.  Social history: She has been widowed for the past 13 years.  She is originally from the [place], [place] area.  Her son-in-law is the mayor of [place], [place].  She is in [place] and enjoys cooking homemade [place] food from [employer].  She works in home health care.   Allergies  Allergen Reactions  . Clindamycin/Lincomycin Swelling    Swelling feet  . Codeine Itching  . Levaquin [Levofloxacin] Swelling  . Nsaids     Needs to avoid d/t peptic ulcer    Current Outpatient Medications  Medication Sig Dispense Refill  . albuterol (PROVENTIL HFA;VENTOLIN HFA) 108 (90 Base) MCG/ACT inhaler Inhale 1-2 puffs into the lungs every 6 (six) hours as needed for wheezing or shortness of breath.    . ALPRAZolam (XANAX) 0.25 MG tablet Take 0.25 mg by mouth at bedtime as needed for anxiety.    Marland Kitchen amitriptyline (ELAVIL) 25 MG tablet Take 50 mg by mouth at bedtime as needed for sleep.     Marland Kitchen amLODipine (NORVASC) 5 MG tablet Take 5 mg by mouth daily.    Marland Kitchen aspirin EC 81 MG tablet Take 81 mg by mouth daily.    Marland Kitchen atorvastatin (LIPITOR) 20 MG tablet Take 40 mg by mouth daily. Patient stopped on 07/12/2016  2  . Calcium 500 MG CHEW Chew 500 mg by mouth daily.    . Cyanocobalamin (HM SUPER VITAMIN B12) 2500 MCG CHEW Chew 2,500 mcg by mouth daily.    . fluticasone (FLONASE) 50 MCG/ACT nasal spray Place  1 spray into the nose 2 (two) times daily as needed (for allergies/throat irritation).     . gabapentin (NEURONTIN) 300 MG capsule Take 1 capsule (300 mg total) by mouth 3 (three) times daily. (Patient taking differently: Take 300 mg by mouth 4 (four) times daily. ) 90 capsule 11  . hydrochlorothiazide (HYDRODIURIL) 25 MG tablet Take 25 mg by mouth daily.    . Linaclotide (LINZESS) 290 MCG CAPS capsule Take 1 capsule (290 mcg total) by mouth daily. 30 capsule 11  . Multiple Vitamins-Minerals (MULTIVITAMIN GUMMIES WOMENS) CHEW  Chew 1 tablet by mouth daily. Women's Ultra Mega Soft Chewable    . olmesartan (BENICAR) 20 MG tablet Take 20 mg by mouth daily.    . pantoprazole (PROTONIX) 40 MG tablet Take 40 mg by mouth daily.    Marland Kitchen terbinafine (LAMISIL) 250 MG tablet Take 1 tablet (250 mg total) by mouth daily. 90 tablet 0  . varenicline (CHANTIX) 1 MG tablet Take 1 mg by mouth 2 (two) times daily.     No current facility-administered medications for this visit.     Past Medical History:  Diagnosis Date  . Arthritis   . Cancer ([place]) 1992   cervical  . Chronic back pain   . COPD (chronic obstructive pulmonary disease) ([facility])   . Diverticulosis   . Fluid retention   . GERD (gastroesophageal reflux disease)   . High cholesterol   . Hypertension   . Hypokalemia   . Peptic ulcer   . Peripheral neuropathy   . Pneumonia    hospitalized in March 2016   . PONV (postoperative nausea and vomiting)   . Pre-diabetes     Past Surgical History:  Procedure Laterality Date  . ABDOMINAL HYSTERECTOMY    . CHOLECYSTECTOMY    . COLONOSCOPY WITH ESOPHAGOGASTRODUODENOSCOPY (EGD) N/A 06/08/2013   [id number] antral ulcer status post biopsy (benign). Hiatal hernia/ Tortuous colon. Internal hemorrhoids. Colonic diverticulosis. Single colonic polyp removed as described above (too small to process). Next TCS 05/2023  . ESOPHAGOGASTRODUODENOSCOPY N/A 05/20/2014   TZG:YFVCBS ulcer ranging between 3-67mm in size was found in the gastric antrum with satellite erosions. Status post ulcer body. Likely NSAID effect.  . INTRAMEDULLARY (IM) NAIL INTERTROCHANTERIC Left 10/05/2013   Procedure: ORIF LEFT HIP  INTERTROCHANTRIC FRACTURE;  Surgeon: [name], MD;  Location: [facility];  Service: Orthopedics;  Laterality: Left;  . KNEE SURGERY     right and left  . LAPAROSCOPIC GASTRIC SLEEVE RESECTION WITH HIATAL HERNIA REPAIR N/A 07/19/2016   Procedure: LAPAROSCOPIC GASTRIC SLEEVE RESECTION WITH HIATAL HERNIA REPAIR;  Surgeon: Arta Bruce  Kinsinger, MD;  Location: [facility];  Service: General;  Laterality: N/A;  . TOTAL KNEE ARTHROPLASTY Right 01/29/2013   Procedure: TOTAL KNEE ARTHROPLASTY WITH HARDWARE REMOVAL;  Surgeon: [name], MD;  Location: [facility];  Service: Orthopedics;  Laterality: Right;  DEPUY/SIGMA RP, SYNTHES SCREWDRIVERS  . TOTAL KNEE ARTHROPLASTY Left 06/11/2013   Dr Mayer Camel  . TOTAL KNEE ARTHROPLASTY Left 06/11/2013   Procedure: TOTAL KNEE ARTHROPLASTY;  Surgeon: [name], MD;  Location: [facility];  Service: Orthopedics;  Laterality: Left;    Social History   Socioeconomic History  . Marital status: Widowed    Spouse name: Not on file  . Number of children: 3  . Years of education: 52  . Highest education level: Not on file  Occupational History  . Occupation: Home health aid  Social Needs  . Financial resource strain: Not on file  . Food  insecurity:    Worry: Not on file    Inability: Not on file  . Transportation needs:    Medical: Not on file    Non-medical: Not on file  Tobacco Use  . Smoking status: Former Smoker    Packs/day: 0.50    Years: 43.00    Pack years: 21.50    Types: Cigarettes    Last attempt to quit: 03/26/2018    Years since quitting: 0.1  . Smokeless tobacco: Never Used  Substance and Sexual Activity  . Alcohol use: No    Alcohol/week: 0.0 standard drinks  . Drug use: No  . Sexual activity: Not on file  Lifestyle  . Physical activity:    Days per week: Not on file    Minutes per session: Not on file  . Stress: Not on file  Relationships  . Social connections:    Talks on phone: Not on file    Gets together: Not on file    Attends religious service: Not on file    Active member of club or organization: Not on file    Attends meetings of clubs or organizations: Not on file    Relationship status: Not on file  . Intimate partner violence:    Fear of current or ex partner: Not on file    Emotionally abused: Not on file    Physically abused: Not on file    Forced  sexual activity: Not on file  Other Topics Concern  . Not on file  Social History Narrative   Lives at home with her brother.   Right-handed.   1-2 cups caffeine daily.      Current Meds  Medication Sig  . albuterol (PROVENTIL HFA;VENTOLIN HFA) 108 (90 Base) MCG/ACT inhaler Inhale 1-2 puffs into the lungs every 6 (six) hours as needed for wheezing or shortness of breath.  . ALPRAZolam (XANAX) 0.25 MG tablet Take 0.25 mg by mouth at bedtime as needed for anxiety.  Marland Kitchen amitriptyline (ELAVIL) 25 MG tablet Take 50 mg by mouth at bedtime as needed for sleep.   Marland Kitchen amLODipine (NORVASC) 5 MG tablet Take 5 mg by mouth daily.  Marland Kitchen aspirin EC 81 MG tablet Take 81 mg by mouth daily.  Marland Kitchen atorvastatin (LIPITOR) 20 MG tablet Take 40 mg by mouth daily. Patient stopped on 07/12/2016  . Calcium 500 MG CHEW Chew 500 mg by mouth daily.  . Cyanocobalamin (HM SUPER VITAMIN B12) 2500 MCG CHEW Chew 2,500 mcg by mouth daily.  . fluticasone (FLONASE) 50 MCG/ACT nasal spray Place 1 spray into the nose 2 (two) times daily as needed (for allergies/throat irritation).   . gabapentin (NEURONTIN) 300 MG capsule Take 1 capsule (300 mg total) by mouth 3 (three) times daily. (Patient taking differently: Take 300 mg by mouth 4 (four) times daily. )  . hydrochlorothiazide (HYDRODIURIL) 25 MG tablet Take 25 mg by mouth daily.  . Linaclotide (LINZESS) 290 MCG CAPS capsule Take 1 capsule (290 mcg total) by mouth daily.  . Multiple Vitamins-Minerals (MULTIVITAMIN GUMMIES WOMENS) CHEW Chew 1 tablet by mouth daily. Women's Ultra Mega Soft Chewable  . olmesartan (BENICAR) 20 MG tablet Take 20 mg by mouth daily.  . pantoprazole (PROTONIX) 40 MG tablet Take 40 mg by mouth daily.  Marland Kitchen terbinafine (LAMISIL) 250 MG tablet Take 1 tablet (250 mg total) by mouth daily.  . varenicline (CHANTIX) 1 MG tablet Take 1 mg by mouth 2 (two) times daily.  . [DISCONTINUED] omega-3 acid ethyl esters (LOVAZA) 1 g  capsule Take 1 g by mouth 2 (two) times  daily.      Review of systems complete and found to be negative unless listed above in HPI    Physical exam Blood pressure 114/64, pulse 74, height 5\' 6"  (1.676 m), weight 197 lb (89.4 kg), SpO2 97 %. General: NAD Neck: No JVD, no thyromegaly or thyroid nodule.  Lungs: Clear to auscultation bilaterally with normal respiratory effort. CV: Nondisplaced PMI. Regular rate and rhythm, normal S1/S2, no S3/S4, no murmur.  No peripheral edema.  No carotid bruit.    Abdomen: Soft, nontender, no distention.  Skin: Intact without lesions or rashes.  Neurologic: Alert and oriented x 3.  Psych: Normal affect. Extremities: No clubbing or cyanosis.  HEENT: Normal.   ECG: Most recent ECG reviewed.   Labs: Lab Results  Component Value Date/Time   K 4.4 07/20/2016 04:49 AM   BUN 16 07/20/2016 04:49 AM   CREATININE 0.84 07/20/2016 04:49 AM   ALT 14 04/21/2018 09:07 AM   TSH 1.510 03/10/2015 11:37 AM   HGB 13.3 04/21/2018 09:07 AM     Lipids: No results found for: LDLCALC, LDLDIRECT, CHOL, TRIG, HDL      ASSESSMENT AND PLAN:  1.  Family history of premature coronary artery disease: The patient has several cardiovascular risk factors including hypertension, hyperlipidemia, and tobacco abuse.  She is trying to quit smoking.  She is on Lipitor 40 mg daily.  She stays very active and exercises 3 times per week for an hour at a time and denies exertional anginal symptoms altogether.  She is also on aspirin.  Blood pressure is controlled.  At this time, no cardiac testing is indicated.  2.  Hypertension: Blood pressure is controlled on present therapy which includes olmesartan and hydrochlorothiazide as well as amlodipine.  No changes to therapy.  3.  Hyperlipidemia: Lipids reviewed above.  Continue Lipitor 40 mg.   Disposition: Follow up in 6 months   Signed: Kate Sable, M.D., F.A.C.C.  05/03/2018, 10:17 AM

## 2018-05-03 NOTE — Patient Instructions (Addendum)
Your physician wants you to follow-up in:6 months with Dr.koneswaran You will receive a reminder letter in the mail two months in advance. If you don't receive a letter, please call our office to schedule the follow-up appointment.     Your physician recommends that you continue on your current medications as directed. Please refer to the Current Medication list given to you today.    If you need a refill on your cardiac medications before your next appointment, please call your pharmacy.      No labs or tests today.       Thank you for choosing [facility] !

## 2018-05-03 NOTE — Progress Notes (Signed)
  Subjective:  Patient ID: [name], female    DOB: [birth year],  MRN: [id number]  Chief Complaint  Patient presents with  . Routine Post Op    right foot doing good - a little pain on the lateral side   DOS: 11/30/17 Procedure: Double Osteotomy Rt; Metatarsal Osteotomy 2,3 Rt; Tailors Bunionectomy Rt; Hammertoe Repair 2,3,4,5 Rt  62 y.o. female returns for post-op check. Denies N/V/F/Ch.  Having a little pain on the outside of the foot. Denies other complaints.  Objective:   General AA&O x3. Normal mood and affect.  Vascular Foot warm and well perfused.  Neurologic Gross sensation intact.  Dermatologic Skin healing well without signs of infection. Skin edges well coapted without signs of infection.  No evidence of pin tract infection  Orthopedic: Tenderness to palpation noted about the surgical sites.    Assessment & Plan:  Patient was evaluated and treated and all questions answered.  S/p Double Osteotomy Rt; Metatarsal Osteotomy 2,3 Rt; Tailors Bunionectomy Rt; Hammertoe Repair 2,3,4,5 Rt -Progressing as expected post-operatively. -Sutures: out. -Pins pulled. -XR taken and reviewed. 2nd toe malaligned. Other toes rectus, hardware intact. -Start PT. -WBAT in surgical shoe.  Return in about 2 weeks (around 01/12/2018) for Post-op.

## 2018-05-03 NOTE — Progress Notes (Signed)
  Subjective:  Patient ID: [name], female    DOB: 06-May-1956,  MRN: [id number]  Chief Complaint  Patient presents with  . Routine Post Op   DOS: 11/30/17 Procedure: Double Osteotomy Rt; Metatarsal Osteotomy 2,3 Rt; Tailors Bunionectomy Rt; Hammertoe Repair 2,3,4,5 Rt  62 y.o. female returns for post-op check. Denies N/V/F/Ch.  Presents today with the pin sticking out of her foot and bent. States that she hit her foot. Still going to work.  Objective:   General AA&O x3. Normal mood and affect.  Vascular Foot warm and well perfused.  Neurologic Gross sensation intact.  Dermatologic Skin healing well without signs of infection. Skin edges well coapted without signs of infection.  No evidence of pin tract infection R 2nd toe pin sticking out, bent.  Orthopedic: Tenderness to palpation noted about the surgical sites.    Assessment & Plan:  Patient was evaluated and treated and all questions answered.  S/p Double Osteotomy Rt; Metatarsal Osteotomy 2,3 Rt; Tailors Bunionectomy Rt; Hammertoe Repair 2,3,4,5 Rt -Progressing as expected post-operatively. -Sutures: intact.  Left intact for another week -Medications refilled: none -Foot redressed. -2nd toe pin removed today because it was bent and sticking out. Discussed likely decreased correction because of this. Patient verbalized understanding. Stressed need for compliance.  Return in about 6 days (around 12/29/2017) for Post-op.

## 2018-05-04 ENCOUNTER — Ambulatory Visit (INDEPENDENT_AMBULATORY_CARE_PROVIDER_SITE_OTHER): Payer: BLUE CROSS/BLUE SHIELD | Admitting: Podiatry

## 2018-05-04 DIAGNOSIS — M779 Enthesopathy, unspecified: Secondary | ICD-10-CM

## 2018-05-04 DIAGNOSIS — M21621 Bunionette of right foot: Secondary | ICD-10-CM | POA: Diagnosis not present

## 2018-05-04 DIAGNOSIS — M2041 Other hammer toe(s) (acquired), right foot: Secondary | ICD-10-CM | POA: Diagnosis not present

## 2018-05-04 DIAGNOSIS — M2011 Hallux valgus (acquired), right foot: Secondary | ICD-10-CM | POA: Diagnosis not present

## 2018-05-04 DIAGNOSIS — M21611 Bunion of right foot: Secondary | ICD-10-CM

## 2018-05-04 DIAGNOSIS — M9689 Other intraoperative and postprocedural complications and disorders of the musculoskeletal system: Secondary | ICD-10-CM

## 2018-05-04 NOTE — Progress Notes (Signed)
Subjective:  Patient ID: [name], female    DOB: [birth year],  MRN: [id number]  Chief Complaint  Patient presents with  . Bunions    surgical consult    62 y.o. female presents with the above complaint.  States that she received orthotics and only been wearing it for couple days.  States that they feel very comfortable.  Review of Systems: Negative except as noted in the HPI. Denies N/V/F/Ch.  Past Medical History:  Diagnosis Date  . Arthritis   . Cancer ([place]) 1992   cervical  . Chronic back pain   . COPD (chronic obstructive pulmonary disease) ([place])   . Diverticulosis   . Fluid retention   . GERD (gastroesophageal reflux disease)   . High cholesterol   . Hypertension   . Hypokalemia   . Peptic ulcer   . Peripheral neuropathy   . Pneumonia    hospitalized in March 2016   . PONV (postoperative nausea and vomiting)   . Pre-diabetes     Current Outpatient Medications:  .  albuterol (PROVENTIL HFA;VENTOLIN HFA) 108 (90 Base) MCG/ACT inhaler, Inhale 1-2 puffs into the lungs every 6 (six) hours as needed for wheezing or shortness of breath., Disp: , Rfl:  .  ALPRAZolam (XANAX) 0.25 MG tablet, Take 0.25 mg by mouth at bedtime as needed for anxiety., Disp: , Rfl:  .  amitriptyline (ELAVIL) 25 MG tablet, Take 50 mg by mouth at bedtime as needed for sleep. , Disp: , Rfl:  .  amLODipine (NORVASC) 5 MG tablet, Take 5 mg by mouth daily., Disp: , Rfl:  .  aspirin EC 81 MG tablet, Take 81 mg by mouth daily., Disp: , Rfl:  .  atorvastatin (LIPITOR) 20 MG tablet, Take 40 mg by mouth daily. Patient stopped on 07/12/2016, Disp: , Rfl: 2 .  Calcium 500 MG CHEW, Chew 500 mg by mouth daily., Disp: , Rfl:  .  Cyanocobalamin (HM SUPER VITAMIN B12) 2500 MCG CHEW, Chew 2,500 mcg by mouth daily., Disp: , Rfl:  .  fluticasone (FLONASE) 50 MCG/ACT nasal spray, Place 1 spray into the nose 2 (two) times daily as needed (for allergies/throat irritation). , Disp: , Rfl:  .  gabapentin (NEURONTIN)  300 MG capsule, Take 1 capsule (300 mg total) by mouth 3 (three) times daily. (Patient taking differently: Take 300 mg by mouth 4 (four) times daily. ), Disp: 90 capsule, Rfl: 11 .  hydrochlorothiazide (HYDRODIURIL) 25 MG tablet, Take 25 mg by mouth daily., Disp: , Rfl:  .  Linaclotide (LINZESS) 290 MCG CAPS capsule, Take 1 capsule (290 mcg total) by mouth daily., Disp: 30 capsule, Rfl: 11 .  Multiple Vitamins-Minerals (MULTIVITAMIN GUMMIES WOMENS) CHEW, Chew 1 tablet by mouth daily. Women's Ultra Mega Soft Chewable, Disp: , Rfl:  .  olmesartan (BENICAR) 20 MG tablet, Take 20 mg by mouth daily., Disp: , Rfl:  .  pantoprazole (PROTONIX) 40 MG tablet, Take 40 mg by mouth daily., Disp: , Rfl:  .  terbinafine (LAMISIL) 250 MG tablet, Take 1 tablet (250 mg total) by mouth daily., Disp: 90 tablet, Rfl: 0 .  varenicline (CHANTIX) 1 MG tablet, Take 1 mg by mouth 2 (two) times daily., Disp: , Rfl:   Social History   Tobacco Use  Smoking Status Former Smoker  . Packs/day: 0.50  . Years: 43.00  . Pack years: 21.50  . Types: Cigarettes  . Last attempt to quit: 03/26/2018  . Years since quitting: 0.1  Smokeless Tobacco Never Used  Allergies  Allergen Reactions  . Clindamycin/Lincomycin Swelling    Swelling feet  . Codeine Itching  . Levaquin [Levofloxacin] Swelling  . Nsaids     Needs to avoid d/t peptic ulcer   Objective:  There were no vitals filed for this visit. There is no height or weight on file to calculate BMI. Constitutional Well developed. Well nourished.  Vascular Dorsalis pedis pulses palpable bilaterally. Posterior tibial pulses palpable bilaterally. Capillary refill normal to all digits.  No cyanosis or clubbing noted. Pedal hair growth normal.  Neurologic Normal speech. Oriented to person, place, and time. Epicritic sensation to light touch grossly present bilaterally.  Dermatologic Nails well groomed and normal in appearance. No open wounds. No skin lesions.    Orthopedic: Normal joint ROM without pain or crepitus bilaterally. No visible deformities. No bony tenderness. First MPJ range of motion   Radiographs: None today. Assessment:   1. 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 Hammer toe of right foot   3. 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 Capsulitis   5. Tailor's bunion of right foot    Plan:  Patient was evaluated and treated and all questions answered.  Capsulitis R 5th MPJ -Injection as below. -We will likely benefit from fifth metatarsal head excision  Procedure: Joint Injection Location: Right 5th MPJ joint Skin Prep: Alcohol. Injectate: 0.5 cc 1% lidocaine plain, 0.5 cc dexamethasone phosphate. Disposition: Patient tolerated procedure well. Injection site dressed with a band-aid.  Right foot bunion recurrence -Labs reviewed no abnormalities noted -Discussed general aim for reconstruction however will trial orthotic therapy first.  Should pain persist would consider surgical intervention.  Definitive plan to be discussed next visit  Return in about 3 weeks (around 05/25/2018) for Capsulitis, Right.

## 2018-05-25 ENCOUNTER — Ambulatory Visit: Payer: BLUE CROSS/BLUE SHIELD

## 2018-05-25 ENCOUNTER — Ambulatory Visit: Payer: BLUE CROSS/BLUE SHIELD | Admitting: Podiatry

## 2018-05-25 ENCOUNTER — Encounter: Payer: Self-pay | Admitting: Podiatry

## 2018-05-25 DIAGNOSIS — M779 Enthesopathy, unspecified: Secondary | ICD-10-CM

## 2018-05-25 DIAGNOSIS — M7751 Other enthesopathy of right foot: Secondary | ICD-10-CM | POA: Diagnosis not present

## 2018-05-25 NOTE — Progress Notes (Signed)
Subjective:  Patient ID: [name], female    DOB: [birth year],  MRN: [id number]  Chief Complaint  Patient presents with  . Foot Problem    Right foot Cellulitis, pain is worse, red/inflamed/swollen  . Nail Problem    Left toenails are brittle, 1st toenail is cracked    62 y.o. female presents with the above complaint.  Pain is getting worse.  Still having pain in the fifth toe area but the injection did help a little bit.  Not able to wear orthotics in her memory foam shoes  Review of Systems: Negative except as noted in the HPI. Denies N/V/F/Ch.  Past Medical History:  Diagnosis Date  . Arthritis   . Cancer ([place]) 1992   cervical  . Chronic back pain   . COPD (chronic obstructive pulmonary disease) ([place])   . Diverticulosis   . Fluid retention   . GERD (gastroesophageal reflux disease)   . High cholesterol   . Hypertension   . Hypokalemia   . Peptic ulcer   . Peripheral neuropathy   . Pneumonia    hospitalized in March 2016   . PONV (postoperative nausea and vomiting)   . Pre-diabetes     Current Outpatient Medications:  .  albuterol (PROVENTIL HFA;VENTOLIN HFA) 108 (90 Base) MCG/ACT inhaler, Inhale 1-2 puffs into the lungs every 6 (six) hours as needed for wheezing or shortness of breath., Disp: , Rfl:  .  ALPRAZolam (XANAX) 0.25 MG tablet, Take 0.25 mg by mouth at bedtime as needed for anxiety., Disp: , Rfl:  .  amitriptyline (ELAVIL) 25 MG tablet, Take 50 mg by mouth at bedtime as needed for sleep. , Disp: , Rfl:  .  amitriptyline (ELAVIL) 50 MG tablet, Take 50 mg by mouth at bedtime., Disp: , Rfl: 5 .  amLODipine (NORVASC) 5 MG tablet, Take 5 mg by mouth daily., Disp: , Rfl:  .  aspirin EC 81 MG tablet, Take 81 mg by mouth daily., Disp: , Rfl:  .  atorvastatin (LIPITOR) 20 MG tablet, Take 40 mg by mouth daily. Patient stopped on 07/12/2016, Disp: , Rfl: 2 .  atorvastatin (LIPITOR) 40 MG tablet, Take 40 mg by mouth daily., Disp: , Rfl: 3 .  Calcium 500 MG CHEW,  Chew 500 mg by mouth daily., Disp: , Rfl:  .  calcium-vitamin D (OSCAL WITH D) 500-200 MG-UNIT tablet, Take by mouth., Disp: , Rfl:  .  Cyanocobalamin (HM SUPER VITAMIN B12) 2500 MCG CHEW, Chew 2,500 mcg by mouth daily., Disp: , Rfl:  .  fluticasone (FLONASE) 50 MCG/ACT nasal spray, Place 1 spray into the nose 2 (two) times daily as needed (for allergies/throat irritation). , Disp: , Rfl:  .  fluticasone (FLOVENT DISKUS) 50 MCG/BLIST diskus inhaler, Inhale into the lungs., Disp: , Rfl:  .  furosemide (LASIX) 20 MG tablet, Take by mouth., Disp: , Rfl:  .  gabapentin (NEURONTIN) 300 MG capsule, Take 1 capsule (300 mg total) by mouth 3 (three) times daily. (Patient taking differently: Take 300 mg by mouth 4 (four) times daily. ), Disp: 90 capsule, Rfl: 11 .  gabapentin (NEURONTIN) 600 MG tablet, Take 600 mg by mouth 3 (three) times daily., Disp: , Rfl: 1 .  hydrochlorothiazide (HYDRODIURIL) 25 MG tablet, Take 25 mg by mouth daily., Disp: , Rfl:  .  HYDROcodone-acetaminophen (NORCO) 10-325 MG tablet, TAKE 1 TABLET BY MOUTH EVERY 4 HOURS AS NEEDED (MAX EIGHT TABLETS PER DAY), Disp: , Rfl: 0 .  Linaclotide (LINZESS) [place]  capsule, Take 1 capsule (290 mcg total) by mouth daily., Disp: 30 capsule, Rfl: 11 .  losartan (COZAAR) 100 MG tablet, Take 100 mg by mouth daily., Disp: , Rfl: 3 .  Multiple Vitamins-Minerals (MULTIVITAMIN GUMMIES WOMENS) CHEW, Chew 1 tablet by mouth daily. Women's Ultra Mega Soft Chewable, Disp: , Rfl:  .  olmesartan (BENICAR) 20 MG tablet, Take 20 mg by mouth daily., Disp: , Rfl:  .  pantoprazole (PROTONIX) 40 MG tablet, Take 40 mg by mouth daily., Disp: , Rfl:  .  terbinafine (LAMISIL) 250 MG tablet, Take 1 tablet (250 mg total) by mouth daily., Disp: 90 tablet, Rfl: 0 .  varenicline (CHANTIX) 1 MG tablet, Take 1 mg by mouth 2 (two) times daily., Disp: , Rfl:   Social History   Tobacco Use  Smoking Status Former Smoker  . Packs/day: 0.50  . Years: 43.00  . Pack years:  21.50  . Types: Cigarettes  . Last attempt to quit: 03/26/2018  . Years since quitting: 0.1  Smokeless Tobacco Never Used    Allergies  Allergen Reactions  . Clindamycin/Lincomycin Swelling    Swelling feet  . Codeine Itching  . Levaquin [Levofloxacin] Swelling  . Nsaids     Needs to avoid d/t peptic ulcer   Objective:  There were no vitals filed for this visit. There is no height or weight on file to calculate BMI. Constitutional Well developed. Well nourished.  Vascular Dorsalis pedis pulses palpable bilaterally. Posterior tibial pulses palpable bilaterally. Capillary refill normal to all digits.  No cyanosis or clubbing noted. Pedal hair growth normal.  Neurologic Normal speech. Oriented to person, place, and time. Epicritic sensation to light touch grossly present bilaterally.  Dermatologic Nails well groomed and normal in appearance. No open wounds. No skin lesions.  Orthopedic: HAV recurrence right. POP R 5th MPJ First MPJ with good range of motion   Radiographs: None today. Assessment:   1. Capsulitis    Plan:  Patient was evaluated and treated and all questions answered.  Capsulitis R 5th MPJ -Repeat injection as below -We will plan for discussion of fifth metatarsal head excision with possible bunion revision.  Procedure: Joint Injection Location: Right 5th MP joint Skin Prep: Alcohol. Injectate: 0.5 cc 1% lidocaine plain, 0.5 cc dexamethasone phosphate. Disposition: Patient tolerated procedure well. Injection site dressed with a band-aid.  Right foot bunion recurrence -We will discuss possible revision at next visit if the orthotics are not helping with her pain.  Return in about 3 weeks (around 06/15/2018) for Capsulitis, Right.

## 2018-06-15 ENCOUNTER — Ambulatory Visit: Payer: BLUE CROSS/BLUE SHIELD | Admitting: Podiatry

## 2018-06-15 DIAGNOSIS — M2011 Hallux valgus (acquired), right foot: Secondary | ICD-10-CM | POA: Diagnosis not present

## 2018-06-15 DIAGNOSIS — M21611 Bunion of right foot: Secondary | ICD-10-CM

## 2018-06-15 DIAGNOSIS — M21621 Bunionette of right foot: Secondary | ICD-10-CM

## 2018-06-15 DIAGNOSIS — M2041 Other hammer toe(s) (acquired), right foot: Secondary | ICD-10-CM | POA: Diagnosis not present

## 2018-06-15 DIAGNOSIS — B351 Tinea unguium: Secondary | ICD-10-CM

## 2018-06-15 DIAGNOSIS — M779 Enthesopathy, unspecified: Secondary | ICD-10-CM | POA: Diagnosis not present

## 2018-06-15 MED ORDER — TERBINAFINE HCL 250 MG PO TABS: 250.0000 mg | ORAL_TABLET | Freq: Every day | ORAL | 0 refills | Status: DC

## 2018-06-22 NOTE — Progress Notes (Signed)
Subjective:  Patient ID: [name], female    DOB: 1956/03/27,  MRN: [id number]  Chief Complaint  Patient presents with  . Foot Problem    Pt states capsulitis in her right foot is about the same. Pt has swelling/numbness in her plantar/lateral right foot, swelling began 1 wk ago, some numbness.  . Nail Problem    Left 1st nail has fallen off. Pt also states fungal medication has been helping.    62 y.o. female presents with the above complaint. History as above  Review of Systems: Negative except as noted in the HPI. Denies N/V/F/Ch.  Past Medical History:  Diagnosis Date  . Arthritis   . Cancer ([place]) 1992   cervical  . Chronic back pain   . COPD (chronic obstructive pulmonary disease) ([place])   . Diverticulosis   . Fluid retention   . GERD (gastroesophageal reflux disease)   . High cholesterol   . Hypertension   . Hypokalemia   . Peptic ulcer   . Peripheral neuropathy   . Pneumonia    hospitalized in March 2016   . PONV (postoperative nausea and vomiting)   . Pre-diabetes     Current Outpatient Medications:  .  albuterol (PROVENTIL HFA;VENTOLIN HFA) 108 (90 Base) MCG/ACT inhaler, Inhale 1-2 puffs into the lungs every 6 (six) hours as needed for wheezing or shortness of breath., Disp: , Rfl:  .  ALPRAZolam (XANAX) 0.25 MG tablet, Take 0.25 mg by mouth at bedtime as needed for anxiety., Disp: , Rfl:  .  amitriptyline (ELAVIL) 25 MG tablet, Take 50 mg by mouth at bedtime as needed for sleep. , Disp: , Rfl:  .  amitriptyline (ELAVIL) 50 MG tablet, Take 50 mg by mouth at bedtime., Disp: , Rfl: 5 .  amLODipine (NORVASC) 5 MG tablet, Take 5 mg by mouth daily., Disp: , Rfl:  .  aspirin EC 81 MG tablet, Take 81 mg by mouth daily., Disp: , Rfl:  .  atorvastatin (LIPITOR) 20 MG tablet, Take 40 mg by mouth daily. Patient stopped on 07/12/2016, Disp: , Rfl: 2 .  atorvastatin (LIPITOR) 40 MG tablet, Take 40 mg by mouth daily., Disp: , Rfl: 3 .  Calcium 500 MG CHEW, Chew 500 mg  by mouth daily., Disp: , Rfl:  .  calcium-vitamin D (OSCAL WITH D) 500-200 MG-UNIT tablet, Take by mouth., Disp: , Rfl:  .  Cyanocobalamin (HM SUPER VITAMIN B12) 2500 MCG CHEW, Chew 2,500 mcg by mouth daily., Disp: , Rfl:  .  fluticasone (FLONASE) 50 MCG/ACT nasal spray, Place 1 spray into the nose 2 (two) times daily as needed (for allergies/throat irritation). , Disp: , Rfl:  .  fluticasone (FLOVENT DISKUS) 50 MCG/BLIST diskus inhaler, Inhale into the lungs., Disp: , Rfl:  .  furosemide (LASIX) 20 MG tablet, Take by mouth., Disp: , Rfl:  .  gabapentin (NEURONTIN) 300 MG capsule, Take 1 capsule (300 mg total) by mouth 3 (three) times daily. (Patient taking differently: Take 300 mg by mouth 4 (four) times daily. ), Disp: 90 capsule, Rfl: 11 .  gabapentin (NEURONTIN) 600 MG tablet, Take 600 mg by mouth 3 (three) times daily., Disp: , Rfl: 1 .  gemfibrozil (LOPID) 600 MG tablet, Take by mouth., Disp: , Rfl:  .  hydrochlorothiazide (HYDRODIURIL) 25 MG tablet, Take 25 mg by mouth daily., Disp: , Rfl:  .  HYDROcodone-acetaminophen (NORCO) 10-325 MG tablet, TAKE 1 TABLET BY MOUTH EVERY 4 HOURS AS NEEDED (MAX EIGHT TABLETS PER DAY), Disp: ,  Rfl: 0 .  Linaclotide (LINZESS) 290 MCG CAPS capsule, Take 1 capsule (290 mcg total) by mouth daily., Disp: 30 capsule, Rfl: 11 .  losartan (COZAAR) 100 MG tablet, Take 100 mg by mouth daily., Disp: , Rfl: 3 .  MOVANTIK 25 MG TABS tablet, TAKE 1 TABLET BY MOUTH DAILY ONE hour BEFORE OR TWO hours AFTER FIRST meal, Disp: , Rfl: 11 .  Multiple Vitamins-Minerals (MULTIVITAMIN GUMMIES WOMENS) CHEW, Chew 1 tablet by mouth daily. Women's Ultra Mega Soft Chewable, Disp: , Rfl:  .  olmesartan (BENICAR) 20 MG tablet, Take 20 mg by mouth daily., Disp: , Rfl:  .  pantoprazole (PROTONIX) 40 MG tablet, Take 40 mg by mouth daily., Disp: , Rfl:  .  terbinafine (LAMISIL) 250 MG tablet, Take 1 tablet (250 mg total) by mouth daily., Disp: 30 tablet, Rfl: 0 .  varenicline (CHANTIX) 1 MG  tablet, Take 1 mg by mouth 2 (two) times daily., Disp: , Rfl:   Social History   Tobacco Use  Smoking Status Former Smoker  . Packs/day: 0.50  . Years: 43.00  . Pack years: 21.50  . Types: Cigarettes  . Last attempt to quit: 03/26/2018  . Years since quitting: 0.2  Smokeless Tobacco Never Used    Allergies  Allergen Reactions  . Clindamycin/Lincomycin Swelling    Swelling feet  . Codeine Itching  . Levaquin [Levofloxacin] Swelling  . Nsaids     Needs to avoid d/t peptic ulcer   Objective:  There were no vitals filed for this visit. There is no height or weight on file to calculate BMI. Constitutional Well developed. Well nourished.  Vascular Dorsalis pedis pulses palpable bilaterally. Posterior tibial pulses palpable bilaterally. Capillary refill normal to all digits.  No cyanosis or clubbing noted. Pedal hair growth normal.  Neurologic Normal speech. Oriented to person, place, and time. Epicritic sensation to light touch grossly present bilaterally.  Dermatologic Nails well groomed and normal in appearance. No open wounds. No skin lesions.  Orthopedic: HAV recurrence right. POP R 5th MPJ First MPJ with good range of motion   Radiographs: None today. Assessment:   1. Capsulitis   2. Tailor's bunion of right foot   3. Hallux valgus with bunions of right foot   4. Hammer toe of right foot    Plan:  Patient was evaluated and treated and all questions answered.  Capsulitis R 5th MPJ, Right Foot Bunion Recurrence -Will further discuss surgical plan at next visit. Would like to have surgery in the next calendar year. -Continue CMOs  Onychomycosis -Refill Lamisil  Return in about 9 weeks (around 08/17/2018) for Surgical planning visit.

## 2018-07-04 ENCOUNTER — Other Ambulatory Visit (HOSPITAL_COMMUNITY): Payer: Self-pay | Admitting: Internal Medicine

## 2018-07-04 DIAGNOSIS — Z1231 Encounter for screening mammogram for malignant neoplasm of breast: Secondary | ICD-10-CM

## 2018-07-24 ENCOUNTER — Other Ambulatory Visit: Payer: Self-pay | Admitting: Podiatry

## 2018-07-24 ENCOUNTER — Ambulatory Visit (HOSPITAL_COMMUNITY)
Admission: RE | Admit: 2018-07-24 | Discharge: 2018-07-24 | Disposition: A | Discharge status: Home / Self Care | Payer: BLUE CROSS/BLUE SHIELD | Source: Ambulatory Visit | Attending: Internal Medicine | Admitting: Internal Medicine

## 2018-07-24 DIAGNOSIS — Z1231 Encounter for screening mammogram for malignant neoplasm of breast: Secondary | ICD-10-CM

## 2018-08-17 ENCOUNTER — Ambulatory Visit: Payer: BLUE CROSS/BLUE SHIELD | Admitting: Podiatry

## 2018-08-25 ENCOUNTER — Ambulatory Visit (INDEPENDENT_AMBULATORY_CARE_PROVIDER_SITE_OTHER): Payer: PRIVATE HEALTH INSURANCE | Admitting: Podiatry

## 2018-08-25 DIAGNOSIS — M21621 Bunionette of right foot: Secondary | ICD-10-CM

## 2018-08-25 DIAGNOSIS — M2011 Hallux valgus (acquired), right foot: Secondary | ICD-10-CM

## 2018-08-25 DIAGNOSIS — M779 Enthesopathy, unspecified: Secondary | ICD-10-CM | POA: Diagnosis not present

## 2018-08-25 DIAGNOSIS — M21611 Bunion of right foot: Secondary | ICD-10-CM

## 2018-08-25 DIAGNOSIS — M2041 Other hammer toe(s) (acquired), right foot: Secondary | ICD-10-CM

## 2018-08-25 NOTE — Patient Instructions (Signed)
Pre-Operative Instructions  Congratulations, you have decided to take an important step towards improving your quality of life.  You can be assured that the doctors and staff at [facility] will be with you every step of the way.  Here are some important things you should know:  1. Plan to be at the surgery center/hospital at least 1 (one) hour prior to your scheduled time, unless otherwise directed by the surgical center/hospital staff.  You must have a responsible adult accompany you, remain during the surgery and drive you home.  Make sure you have directions to the surgical center/hospital to ensure you arrive on time. 2. If you are having surgery at [facility] or [facility], you will need a copy of your medical history and physical form from your family physician within one month prior to the date of surgery. We will give you a form for your primary physician to complete.  3. We make every effort to accommodate the date you request for surgery.  However, there are times where surgery dates or times have to be moved.  We will contact you as soon as possible if a change in schedule is required.   4. No aspirin/ibuprofen for one week before surgery.  If you are on aspirin, any non-steroidal anti-inflammatory medications (Mobic, Aleve, Ibuprofen) should not be taken seven (7) days prior to your surgery.  You make take Tylenol for pain prior to surgery.  5. Medications - If you are taking daily heart and blood pressure medications, seizure, reflux, allergy, asthma, anxiety, pain or diabetes medications, make sure you notify the surgery center/hospital before the day of surgery so they can tell you which medications you should take or avoid the day of surgery. 6. No food or drink after midnight the night before surgery unless directed otherwise by surgical center/hospital staff. 7. No alcoholic beverages 24-hours prior to surgery.  No smoking 24-hours prior or 24-hours after  surgery. 8. Wear loose pants or shorts. They should be loose enough to fit over bandages, boots, and casts. 9. Don't wear slip-on shoes. Sneakers are preferred. 10. Bring your boot with you to the surgery center/hospital.  Also bring crutches or a walker if your physician has prescribed it for you.  If you do not have this equipment, it will be provided for you after surgery. 11. If you have not been contacted by the surgery center/hospital by the day before your surgery, call to confirm the date and time of your surgery. 12. Leave-time from work may vary depending on the type of surgery you have.  Appropriate arrangements should be made prior to surgery with your employer. 13. Prescriptions will be provided immediately following surgery by your doctor.  Fill these as soon as possible after surgery and take the medication as directed. Pain medications will not be refilled on weekends and must be approved by the doctor. 14. Remove nail polish on the operative foot and avoid getting pedicures prior to surgery. 15. Wash the night before surgery.  The night before surgery wash the foot and leg well with water and the antibacterial soap provided. Be sure to pay special attention to beneath the toenails and in between the toes.  Wash for at least three (3) minutes. Rinse thoroughly with water and dry well with a towel.  Perform this wash unless told not to do so by your physician.  Enclosed: 1 Ice pack (please put in freezer the night before surgery)   1 Hibiclens skin cleaner     Pre-op instructions  If you have any questions regarding the instructions, please do not hesitate to call our office.  [place]: [address], [place], [place] [address] -- [phone]  [place]: [address] [place], [place] [address] -- [phone]  [place]: [address]  [place], [place] [address] -- [phone]  [place]: [address], [address], [place], [place] [address] -- [phone]  Website:  [email or web address] 

## 2018-08-28 ENCOUNTER — Telehealth: Payer: Self-pay | Admitting: *Deleted

## 2018-08-28 NOTE — Telephone Encounter (Signed)
(  I didn't receive any paperwork on this patient.)

## 2018-08-28 NOTE — Telephone Encounter (Signed)
"  I'm not sure but I think Dr. March Rummage has me scheduled for surgery on Wednesday.  I just seen him on Friday.  I don't know if I can do surgery that fast.  I can do it the first of next week.  If there's any way, give me a call.  You guys haven't even checked the insurance to see if they will cover the surgery.  I'm going to my physician today to have the papers filled out and faxed to you about my history.  I'm available after 1 pm today.  Have a blessed day."

## 2018-08-29 NOTE — Telephone Encounter (Signed)
I have her paperwork - I can't do Wednesday. Please let her know we will call her back with a date

## 2018-08-30 NOTE — Telephone Encounter (Signed)
I am returning your call.  I just received your paperwork.  Dr. March Rummage can do your surgery on January 17.  Have you had your history and physical form completed?  "That's what I dropped off by your office, it's in a manila envelope."  I have it, it's just a history.  I don't see a recent physical.  Have you seen your doctor within the past 30 days?  "No I have not.  I will have to pay $85 to see him.  I was trying to avoid that.  Does it have to be recent?"  Yes, it's a [facility] policy.  "Will I need new forms to take to the doctor?"  Yes, most likely.  "I'm still in town, I can swing by there to pick it up.  What time on January 17?"  Your surgery will start at 2 pm.  Someone from the surgical center will call you a day or two prior to that date and they will give you your arrival time.  I will leave your forms up front.  Please call and let me know what the date is for your physical.  "I'll definitely give you a call."  History and physical form was put at the front desk.

## 2018-08-31 NOTE — Telephone Encounter (Signed)
"  I'm here at my doctor's office getting my physical done.  Do I need to do any blood work while [employer] here?"  Someone from [facility] will call you to schedule a pre-op testing appointment.  "Okay, that's all I needed to know."

## 2018-09-03 NOTE — Progress Notes (Signed)
Subjective:  Patient ID: [name], female    DOB: [birth year],  MRN: [id number]  Chief Complaint  Patient presents with  . Consult    discuss surgery - right foot    63 y.o. female presents with the above complaint. History as above.  Here to discuss surgical intervention as she is having pain of the right foot.  Was using the orthotic still did not have relief ready for surgery ready to be nonweightbearing states that she will not walk on her foot earlier like she did last time.  Review of Systems: Negative except as noted in the HPI. Denies N/V/F/Ch.  Past Medical History:  Diagnosis Date  . Arthritis   . Cancer ([place]) 1992   cervical  . Chronic back pain   . COPD (chronic obstructive pulmonary disease) ([place])   . Diverticulosis   . Fluid retention   . GERD (gastroesophageal reflux disease)   . High cholesterol   . Hypertension   . Hypokalemia   . Peptic ulcer   . Peripheral neuropathy   . Pneumonia    hospitalized in March 2016   . PONV (postoperative nausea and vomiting)   . Pre-diabetes     Current Outpatient Medications:  .  ALPRAZolam (XANAX) 0.25 MG tablet, Take 0.25 mg by mouth at bedtime as needed for anxiety., Disp: , Rfl:  .  amitriptyline (ELAVIL) 25 MG tablet, Take 25 mg by mouth at bedtime. , Disp: , Rfl:  .  amLODipine (NORVASC) 5 MG tablet, Take 5 mg by mouth daily., Disp: , Rfl:  .  atorvastatin (LIPITOR) 40 MG tablet, Take 40 mg by mouth daily., Disp: , Rfl:  .  Calcium Carbonate-Vitamin D 600-400 MG-UNIT chew tablet, Chew 1 tablet by mouth 4 (four) times daily. , Disp: , Rfl:  .  cholecalciferol (VITAMIN D3) 25 MCG (1000 UT) tablet, Take 4,000 Units by mouth daily., Disp: , Rfl:  .  fluticasone (FLONASE) 50 MCG/ACT nasal spray, Place 1 spray into the nose 2 (two) times daily as needed (for allergies/throat irritation). , Disp: , Rfl:  .  gabapentin (NEURONTIN) 600 MG tablet, Take 600 mg by mouth 3 (three) times daily., Disp: , Rfl: 1 .   hydrochlorothiazide (HYDRODIURIL) 25 MG tablet, Take 25 mg by mouth daily., Disp: , Rfl:  .  HYDROcodone-acetaminophen (NORCO) 10-325 MG tablet, Take 1 tablet by mouth every 4 (four) hours. , Disp: , Rfl: 0 .  Linaclotide (LINZESS) 290 MCG CAPS capsule, Take 1 capsule (290 mcg total) by mouth daily. (Patient taking differently: Take 290 mcg by mouth 2 (two) times a week. ), Disp: 30 capsule, Rfl: 11 .  MOVANTIK 25 MG TABS tablet, Take 25 mg by mouth 2 (two) times a week. , Disp: , Rfl: 11 .  Multiple Vitamins-Minerals (MULTIVITAMIN GUMMIES WOMENS) CHEW, Chew 1 tablet by mouth daily. Flintstone's chewable, Disp: , Rfl:  .  olmesartan (BENICAR) 20 MG tablet, Take 20 mg by mouth daily., Disp: , Rfl:   Social History   Tobacco Use  Smoking Status Former Smoker  . Packs/day: 0.50  . Years: 43.00  . Pack years: 21.50  . Types: Cigarettes  . Last attempt to quit: 03/26/2018  . Years since quitting: 0.4  Smokeless Tobacco Never Used    Allergies  Allergen Reactions  . Clindamycin/Lincomycin Swelling    Swelling feet  . Codeine Itching  . Levaquin [Levofloxacin] Swelling  . Nsaids     Needs to avoid d/t peptic ulcer   Objective:  There were no vitals filed for this visit. There is no height or weight on file to calculate BMI. Constitutional Well developed. Well nourished.  Vascular Dorsalis pedis pulses palpable bilaterally. Posterior tibial pulses palpable bilaterally. Capillary refill normal to all digits.  No cyanosis or clubbing noted. Pedal hair growth normal.  Neurologic Normal speech. Oriented to person, place, and time. Epicritic sensation to light touch grossly present bilaterally.  Dermatologic Nails well groomed and normal in appearance. No open wounds. No skin lesions.  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 Assessment:   1. 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  Hammer toe of right foot   3. Tailor's bunion of right foot   4. Capsulitis    Plan:  Patient was evaluated and treated and all questions answered.  Capsulitis R 5th MPJ, Right Foot Bunion Recurrence -Likely that hypermobility is the true cause of her bunion which led to early recurrence.  -X-rays reviewed with patient surgical plan discussed.  Discussed postop course including nonweightbearing after the procedure for 6 weeks. -Patient has failed all conservative therapy and wishes to proceed with surgical intervention. All risks, benefits, and alternatives discussed with patient. No guarantees given. Consent reviewed and signed by patient. -Planned procedures: Right Lapidus bunionectomy, fifth metatarsal head excision, second hammertoe correction, possible tenotomy capsulotomy second MPJ  Return for post op.

## 2018-09-05 ENCOUNTER — Telehealth: Payer: Self-pay | Admitting: *Deleted

## 2018-09-05 NOTE — Pre-Procedure Instructions (Signed)
[name]  09/05/2018      [facility] - [place] [address], [place], [place] [address] [place] [address] Phone: [phone] Fax: [phone]  [place], [place], [place] - [address] [place] [address] Phone: [phone] Fax: [phone]  [facility] [phone] - [place], [place] - [place] [address] [place] [address] Phone: [phone] Fax: [phone]    Your procedure is scheduled on Friday, January 17th.  Report to [facility] Admitting at 12:00 P.M.  Call this number if you have problems the morning of surgery:  [phone]   Remember:  Do not eat or drink after midnight.   Take these medicines the morning of surgery with A SIP OF WATER  amLODipine (NORVASC)  gabapentin (NEURONTIN) Linaclotide (LINZESS) fluticasone (FLONASE)-as needed HYDROcodone-acetaminophen (NORCO)   As of today, STOP taking any Aspirin (unless otherwise instructed by your surgeon), Aleve, Naproxen, Ibuprofen, Motrin, Advil, Goody's, BC's, all herbal medications, fish oil, and all vitamins.     Do not wear jewelry, make-up or nail polish.  Do not wear lotions, powders, or perfumes, or deodorant.  Do not shave 48 hours prior to surgery.   Do not bring valuables to the hospital.  [facility] is not responsible for any belongings or valuables.  Contacts, dentures or bridgework may not be worn into surgery.  Leave your suitcase in the car.  After surgery it may be brought to your room.  For patients admitted to the hospital, discharge time will be determined by your treatment team.  Patients discharged the day of surgery will not be allowed to drive home.   Special instructions:   [place]- Preparing For Surgery  Before surgery, you can play an important role. Because skin is not sterile, your skin needs to be as free of germs as possible. You  can reduce the number of germs on your skin by washing with CHG (chlorahexidine gluconate) Soap before surgery.  CHG is an antiseptic cleaner which kills germs and bonds with the skin to continue killing germs even after washing.    Oral Hygiene is also important to reduce your risk of infection.  Remember - BRUSH YOUR TEETH THE MORNING OF SURGERY WITH YOUR REGULAR TOOTHPASTE  Please do not use if you have an allergy to CHG or antibacterial soaps. If your skin becomes reddened/irritated stop using the CHG.  Do not shave (including legs and underarms) for at least 48 hours prior to first CHG shower. It is OK to shave your face.  Please follow these instructions carefully.   1. Shower the NIGHT BEFORE SURGERY and the MORNING OF SURGERY with CHG.   2. If you chose to wash your hair, wash your hair first as usual with your normal shampoo.  3. After you shampoo, rinse your hair and body thoroughly to remove the shampoo.  4. Use CHG as you would any other liquid soap. You can apply CHG directly to the skin and wash gently with a scrungie or a clean washcloth.   5. Apply the CHG Soap to your body ONLY FROM THE NECK DOWN.  Do not use on open wounds or open sores. Avoid contact with your eyes, ears, mouth and genitals (private parts). Wash Face and genitals (private parts)  with your normal soap.  6. Wash thoroughly, paying  special attention to the area where your surgery will be performed.  7. Thoroughly rinse your body with warm water from the neck down.  8. DO NOT shower/wash with your normal soap after using and rinsing off the CHG Soap.  9. Pat yourself dry with a CLEAN TOWEL.  10. Wear CLEAN PAJAMAS to bed the night before surgery, wear comfortable clothes the morning of surgery  11. Place CLEAN SHEETS on your bed the night of your first shower and DO NOT SLEEP WITH PETS.    Day of Surgery:  Do not apply any deodorants/lotions.  Please wear clean clothes to the hospital/surgery  center.   Remember to brush your teeth WITH YOUR REGULAR TOOTHPASTE.   Please read over the following fact sheets that you were given.

## 2018-09-05 NOTE — Telephone Encounter (Signed)
"  Do you have an authorization number for [name]?  She's scheduled for surgery on January 17."  I am working on it as we speak.  "Can you enter it into Epic or call and let me know the authorization number?"  Yes, I'll give you a call.  I called [employer] and spoke to [place].  She informed me that authorization was not required for procedure codes 28297, 28308, 28285, and 28270.  The reference number for the call is [id number].    I called and left a message for Estill Bamberg at [facility], informing her that authorization is not required for the procedures.

## 2018-09-05 NOTE — Telephone Encounter (Signed)
"  We need orders put in for [name].  She's scheduled to come in tomorrow for her pre-admission testing."  I will let Dr. March Rummage know.

## 2018-09-06 ENCOUNTER — Encounter (HOSPITAL_COMMUNITY): Payer: Self-pay

## 2018-09-06 ENCOUNTER — Other Ambulatory Visit: Payer: Self-pay

## 2018-09-06 ENCOUNTER — Encounter (HOSPITAL_COMMUNITY)
Admission: RE | Admit: 2018-09-06 | Discharge: 2018-09-06 | Disposition: A | Discharge status: Home / Self Care | Payer: PRIVATE HEALTH INSURANCE | Source: Ambulatory Visit | Attending: Podiatry | Admitting: Podiatry

## 2018-09-06 DIAGNOSIS — J449 Chronic obstructive pulmonary disease, unspecified: Secondary | ICD-10-CM | POA: Diagnosis not present

## 2018-09-06 DIAGNOSIS — M7741 Metatarsalgia, right foot: Secondary | ICD-10-CM | POA: Diagnosis not present

## 2018-09-06 DIAGNOSIS — Z79899 Other long term (current) drug therapy: Secondary | ICD-10-CM | POA: Diagnosis not present

## 2018-09-06 DIAGNOSIS — Z01812 Encounter for preprocedural laboratory examination: Secondary | ICD-10-CM

## 2018-09-06 DIAGNOSIS — Z87891 Personal history of nicotine dependence: Secondary | ICD-10-CM | POA: Diagnosis not present

## 2018-09-06 DIAGNOSIS — K5909 Other constipation: Secondary | ICD-10-CM | POA: Diagnosis not present

## 2018-09-06 DIAGNOSIS — M25374 Other instability, right foot: Secondary | ICD-10-CM | POA: Diagnosis not present

## 2018-09-06 DIAGNOSIS — M2041 Other hammer toe(s) (acquired), right foot: Secondary | ICD-10-CM | POA: Diagnosis not present

## 2018-09-06 DIAGNOSIS — M62471 Contracture of muscle, right ankle and foot: Secondary | ICD-10-CM | POA: Diagnosis not present

## 2018-09-06 DIAGNOSIS — E782 Mixed hyperlipidemia: Secondary | ICD-10-CM | POA: Diagnosis not present

## 2018-09-06 DIAGNOSIS — I10 Essential (primary) hypertension: Secondary | ICD-10-CM | POA: Diagnosis not present

## 2018-09-06 DIAGNOSIS — F419 Anxiety disorder, unspecified: Secondary | ICD-10-CM | POA: Diagnosis not present

## 2018-09-06 DIAGNOSIS — G629 Polyneuropathy, unspecified: Secondary | ICD-10-CM | POA: Diagnosis not present

## 2018-09-06 DIAGNOSIS — M2011 Hallux valgus (acquired), right foot: Secondary | ICD-10-CM | POA: Diagnosis not present

## 2018-09-06 HISTORY — DX: Anxiety disorder, unspecified: F41.9

## 2018-09-06 LAB — BASIC METABOLIC PANEL
Anion gap: 10 (ref 5–15)
BUN: 21 mg/dL (ref 8–23)
CO2: 27 mmol/L (ref 22–32)
Calcium: 10.3 mg/dL (ref 8.9–10.3)
Chloride: 103 mmol/L (ref 98–111)
Creatinine, Ser: 0.87 mg/dL (ref 0.44–1.00)
GFR calc Af Amer: >60 mL/min (ref >60)
GFR calc non Af Amer: >60 mL/min (ref >60)
Glucose, Bld: 92 mg/dL (ref 70–99)
Potassium: 4 mmol/L (ref 3.5–5.1)
Sodium: 140 mmol/L (ref 135–145)

## 2018-09-06 LAB — HEMOGLOBIN A1C
Hgb A1c MFr Bld: 5.9 % — ABNORMAL HIGH (ref 4.8–5.6)
MEAN PLASMA GLUCOSE: 122.63 mg/dL

## 2018-09-06 LAB — CBC
HCT: 39.9 % (ref 36.0–46.0)
HEMOGLOBIN: 12.6 g/dL (ref 12.0–15.0)
MCH: 30.3 pg (ref 26.0–34.0)
MCHC: 31.6 g/dL (ref 30.0–36.0)
MCV: 95.9 fL (ref 80.0–100.0)
Platelets: 184 10*3/uL (ref 150–400)
RBC: 4.16 MIL/uL (ref 3.87–5.11)
RDW: 12.2 % (ref 11.5–15.5)
WBC: 9.3 10*3/uL (ref 4.0–10.5)
nRBC: 0 % (ref 0.0–0.2)

## 2018-09-06 NOTE — Progress Notes (Signed)
PCP: [facility]. [facility] Cardiologist: Dr. Dierdre Highman saw 05/03/2018 for family hx of heart disease  EKG:  05/03/2018 CXR: 02/03/2018 ECHO: 2005 Stress Test: denies Cardiac Cath: denies  Patient denies shortness of breath, fever, cough, and chest pain at PAT appointment.  Patient verbalized understanding of instructions provided today at the PAT appointment.  Patient asked to review instructions at home and day of surgery.

## 2018-09-06 NOTE — Pre-Procedure Instructions (Signed)
[name]  09/06/2018    Your procedure is scheduled on Friday, January 17th.  Report to [facility] Admitting at 12:00 P.M.  Call this number if you have problems the morning of surgery:  [phone]   Remember:  Do not eat or drink after midnight.   Take these medicines the morning of surgery with A SIP OF WATER  amLODipine (NORVASC)  gabapentin (NEURONTIN) fluticasone (FLONASE)-as needed HYDROcodone-acetaminophen (NORCO)   As of today, STOP taking any Aspirin (unless otherwise instructed by your surgeon), Aleve, Naproxen, Ibuprofen, Motrin, Advil, Goody's, BC's, all herbal medications, fish oil, and all vitamins.     Do not wear jewelry, make-up or nail polish.  Do not wear lotions, powders, or perfumes, or deodorant.  Do not shave 48 hours prior to surgery.   Do not bring valuables to the hospital.  [facility] is not responsible for any belongings or valuables.  Contacts, dentures or bridgework may not be worn into surgery.  Leave your suitcase in the car.  After surgery it may be brought to your room.  For patients admitted to the hospital, discharge time will be determined by your treatment team.  Patients discharged the day of surgery will not be allowed to drive home.   Special instructions:   [place]- Preparing For Surgery  Before surgery, you can play an important role. Because skin is not sterile, your skin needs to be as free of germs as possible. You can reduce the number of germs on your skin by washing with CHG (chlorahexidine gluconate) Soap before surgery.  CHG is an antiseptic cleaner which kills germs and bonds with the skin to continue killing germs even after washing.    Oral Hygiene is also important to reduce your risk of infection.  Remember - BRUSH YOUR TEETH THE MORNING OF SURGERY WITH YOUR REGULAR TOOTHPASTE  Please do not use if you have an allergy to CHG or antibacterial soaps. If your skin becomes reddened/irritated stop  using the CHG.  Do not shave (including legs and underarms) for at least 48 hours prior to first CHG shower. It is OK to shave your face.  Please follow these instructions carefully.   1. Shower the NIGHT BEFORE SURGERY and the MORNING OF SURGERY with CHG.   2. If you chose to wash your hair, wash your hair first as usual with your normal shampoo.  3. After you shampoo, rinse your hair and body thoroughly to remove the shampoo.  4. Use CHG as you would any other liquid soap. You can apply CHG directly to the skin and wash gently with a scrungie or a clean washcloth.   5. Apply the CHG Soap to your body ONLY FROM THE NECK DOWN.  Do not use on open wounds or open sores. Avoid contact with your eyes, ears, mouth and genitals (private parts). Wash Face and genitals (private parts)  with your normal soap.  6. Wash thoroughly, paying special attention to the area where your surgery will be performed.  7. Thoroughly rinse your body with warm water from the neck down.  8. DO NOT shower/wash with your normal soap after using and rinsing off the CHG Soap.  9. Pat yourself dry with a CLEAN TOWEL.  10. Wear CLEAN PAJAMAS to bed the night before surgery, wear comfortable clothes the morning of surgery  11. Place CLEAN SHEETS on your bed the night of your first shower and DO NOT SLEEP WITH PETS.  Day of Surgery:  Do not apply any deodorants/lotions.  Please wear clean clothes to the hospital/surgery center.   Remember to brush your teeth WITH YOUR REGULAR TOOTHPASTE.   Please read over the following fact sheets that you were given.

## 2018-09-08 ENCOUNTER — Encounter (HOSPITAL_COMMUNITY): Payer: Self-pay

## 2018-09-08 ENCOUNTER — Ambulatory Visit (HOSPITAL_COMMUNITY): Payer: PRIVATE HEALTH INSURANCE

## 2018-09-08 ENCOUNTER — Ambulatory Visit (HOSPITAL_COMMUNITY): Payer: PRIVATE HEALTH INSURANCE | Admitting: Anesthesiology

## 2018-09-08 ENCOUNTER — Encounter (HOSPITAL_COMMUNITY)
Admission: RE | Disposition: A | Discharge status: Home / Self Care | Payer: Self-pay | Source: Home / Self Care | Attending: Podiatry

## 2018-09-08 ENCOUNTER — Ambulatory Visit (HOSPITAL_COMMUNITY)
Admission: RE | Admit: 2018-09-08 | Discharge: 2018-09-08 | Disposition: A | Discharge status: Home / Self Care | Payer: PRIVATE HEALTH INSURANCE | Attending: Podiatry | Admitting: Podiatry

## 2018-09-08 ENCOUNTER — Other Ambulatory Visit: Payer: Self-pay | Admitting: Podiatry

## 2018-09-08 DIAGNOSIS — G629 Polyneuropathy, unspecified: Secondary | ICD-10-CM | POA: Insufficient documentation

## 2018-09-08 DIAGNOSIS — M25374 Other instability, right foot: Secondary | ICD-10-CM | POA: Insufficient documentation

## 2018-09-08 DIAGNOSIS — M21611 Bunion of right foot: Secondary | ICD-10-CM

## 2018-09-08 DIAGNOSIS — M62471 Contracture of muscle, right ankle and foot: Secondary | ICD-10-CM | POA: Insufficient documentation

## 2018-09-08 DIAGNOSIS — K5909 Other constipation: Secondary | ICD-10-CM | POA: Insufficient documentation

## 2018-09-08 DIAGNOSIS — M2041 Other hammer toe(s) (acquired), right foot: Secondary | ICD-10-CM | POA: Insufficient documentation

## 2018-09-08 DIAGNOSIS — M7751 Other enthesopathy of right foot: Secondary | ICD-10-CM

## 2018-09-08 DIAGNOSIS — Z969 Presence of functional implant, unspecified: Secondary | ICD-10-CM | POA: Diagnosis not present

## 2018-09-08 DIAGNOSIS — E782 Mixed hyperlipidemia: Secondary | ICD-10-CM | POA: Insufficient documentation

## 2018-09-08 DIAGNOSIS — M6240 Contracture of muscle, unspecified site: Secondary | ICD-10-CM

## 2018-09-08 DIAGNOSIS — M21619 Bunion of unspecified foot: Secondary | ICD-10-CM

## 2018-09-08 DIAGNOSIS — Z87891 Personal history of nicotine dependence: Secondary | ICD-10-CM | POA: Insufficient documentation

## 2018-09-08 DIAGNOSIS — J449 Chronic obstructive pulmonary disease, unspecified: Secondary | ICD-10-CM | POA: Insufficient documentation

## 2018-09-08 DIAGNOSIS — M2011 Hallux valgus (acquired), right foot: Secondary | ICD-10-CM | POA: Diagnosis not present

## 2018-09-08 DIAGNOSIS — F419 Anxiety disorder, unspecified: Secondary | ICD-10-CM | POA: Insufficient documentation

## 2018-09-08 DIAGNOSIS — M7741 Metatarsalgia, right foot: Secondary | ICD-10-CM | POA: Insufficient documentation

## 2018-09-08 DIAGNOSIS — Z79899 Other long term (current) drug therapy: Secondary | ICD-10-CM | POA: Insufficient documentation

## 2018-09-08 DIAGNOSIS — I10 Essential (primary) hypertension: Secondary | ICD-10-CM | POA: Insufficient documentation

## 2018-09-08 HISTORY — PX: CAPSULOTOMY: SHX379

## 2018-09-08 HISTORY — PX: HALLUX VALGUS LAPIDUS: SHX6626

## 2018-09-08 HISTORY — PX: METATARSAL OSTEOTOMY: SHX1641

## 2018-09-08 HISTORY — PX: HAMMER TOE SURGERY: SHX385

## 2018-09-08 SURGERY — BUNIONECTOMY, LAPIDUS
Anesthesia: General | Site: Foot | Laterality: Right

## 2018-09-08 MED ORDER — BUPIVACAINE HCL 0.5 % IJ SOLN
INTRAMUSCULAR | Status: DC | PRN
  Administered 2018-09-08: 20 mL

## 2018-09-08 MED ORDER — OXYCODONE-ACETAMINOPHEN 10-325 MG PO TABS: 1.0000 | ORAL_TABLET | ORAL | 0 refills | Status: DC | PRN

## 2018-09-08 MED ORDER — MIDAZOLAM HCL 2 MG/2ML IJ SOLN
INTRAMUSCULAR | Status: AC
  Filled 2018-09-08: qty 2

## 2018-09-08 MED ORDER — SODIUM CHLORIDE 0.9 % IV SOLN
INTRAVENOUS | Status: DC | PRN
  Administered 2018-09-08: 25 ug/min via INTRAVENOUS

## 2018-09-08 MED ORDER — FENTANYL CITRATE (PF) 100 MCG/2ML IJ SOLN
INTRAMUSCULAR | Status: DC | PRN
  Administered 2018-09-08 (×6): 25 ug via INTRAVENOUS

## 2018-09-08 MED ORDER — CEFAZOLIN SODIUM-DEXTROSE 2-4 GM/100ML-% IV SOLN
INTRAVENOUS | Status: AC
  Filled 2018-09-08: qty 100

## 2018-09-08 MED ORDER — EPHEDRINE SULFATE 50 MG/ML IJ SOLN
INTRAMUSCULAR | Status: DC | PRN
  Administered 2018-09-08: 5 mg via INTRAVENOUS
  Administered 2018-09-08: 10 mg via INTRAVENOUS

## 2018-09-08 MED ORDER — PROPOFOL 10 MG/ML IV BOLUS
INTRAVENOUS | Status: AC
  Filled 2018-09-08: qty 20

## 2018-09-08 MED ORDER — ACETAMINOPHEN 500 MG PO TABS
1000.0000 mg | ORAL_TABLET | Freq: Once | ORAL | Status: AC
  Administered 2018-09-08: 1000 mg via ORAL
  Filled 2018-09-08: qty 2

## 2018-09-08 MED ORDER — 0.9 % SODIUM CHLORIDE (POUR BTL) OPTIME
TOPICAL | Status: DC | PRN
  Administered 2018-09-08: 1000 mL

## 2018-09-08 MED ORDER — BUPIVACAINE HCL (PF) 0.5 % IJ SOLN
INTRAMUSCULAR | Status: AC
  Filled 2018-09-08: qty 30

## 2018-09-08 MED ORDER — ONDANSETRON HCL 4 MG/2ML IJ SOLN: 4.0000 mg | Freq: Once | INTRAMUSCULAR | Status: DC | PRN

## 2018-09-08 MED ORDER — CEFAZOLIN SODIUM-DEXTROSE 2-4 GM/100ML-% IV SOLN
2.0000 g | INTRAVENOUS | Status: AC
  Administered 2018-09-08: 2 g via INTRAVENOUS

## 2018-09-08 MED ORDER — CEPHALEXIN 500 MG PO CAPS: 500.0000 mg | ORAL_CAPSULE | Freq: Two times a day (BID) | ORAL | 0 refills | Status: DC

## 2018-09-08 MED ORDER — FENTANYL CITRATE (PF) 250 MCG/5ML IJ SOLN
INTRAMUSCULAR | Status: AC
  Filled 2018-09-08: qty 5

## 2018-09-08 MED ORDER — PHENYLEPHRINE HCL 10 MG/ML IJ SOLN
INTRAMUSCULAR | Status: DC | PRN
  Administered 2018-09-08: 80 ug via INTRAVENOUS

## 2018-09-08 MED ORDER — FENTANYL CITRATE (PF) 100 MCG/2ML IJ SOLN
INTRAMUSCULAR | Status: AC
  Filled 2018-09-08: qty 2

## 2018-09-08 MED ORDER — FENTANYL CITRATE (PF) 100 MCG/2ML IJ SOLN: 25.0000 ug | INTRAMUSCULAR | Status: DC | PRN

## 2018-09-08 MED ORDER — MIDAZOLAM HCL 5 MG/5ML IJ SOLN
INTRAMUSCULAR | Status: DC | PRN
  Administered 2018-09-08: 2 mg via INTRAVENOUS

## 2018-09-08 MED ORDER — ONDANSETRON HCL 4 MG/2ML IJ SOLN
INTRAMUSCULAR | Status: DC | PRN
  Administered 2018-09-08: 4 mg via INTRAVENOUS

## 2018-09-08 MED ORDER — LIDOCAINE HCL (PF) 1 % IJ SOLN
INTRAMUSCULAR | Status: AC
  Filled 2018-09-08: qty 30

## 2018-09-08 MED ORDER — BUPIVACAINE HCL (PF) 0.25 % IJ SOLN
INTRAMUSCULAR | Status: AC
  Filled 2018-09-08: qty 30

## 2018-09-08 MED ORDER — DEXAMETHASONE SODIUM PHOSPHATE 10 MG/ML IJ SOLN
INTRAMUSCULAR | Status: DC | PRN
  Administered 2018-09-08: 4 mg via INTRAVENOUS

## 2018-09-08 MED ORDER — LIDOCAINE 2% (20 MG/ML) 5 ML SYRINGE
INTRAMUSCULAR | Status: DC | PRN
  Administered 2018-09-08: 60 mg via INTRAVENOUS

## 2018-09-08 MED ORDER — PROPOFOL 10 MG/ML IV BOLUS
INTRAVENOUS | Status: DC | PRN
  Administered 2018-09-08: 120 mg via INTRAVENOUS

## 2018-09-08 MED ORDER — LACTATED RINGERS IV SOLN
INTRAVENOUS | Status: DC
  Administered 2018-09-08 (×2): via INTRAVENOUS

## 2018-09-08 SURGICAL SUPPLY — 80 items
APL SKNCLS STERI-STRIP NONHPOA (GAUZE/BANDAGES/DRESSINGS) ×1
APPLICATOR COTTON TIP 6IN STRL (MISCELLANEOUS) IMPLANT
BANDAGE ACE 4X5 VEL STRL LF (GAUZE/BANDAGES/DRESSINGS) ×2 IMPLANT
BANDAGE ESMARK 6X9 LF (GAUZE/BANDAGES/DRESSINGS) IMPLANT
BENZOIN TINCTURE PRP APPL 2/3 (GAUZE/BANDAGES/DRESSINGS) ×2 IMPLANT
BIT DRILL 3.6 (BIT) ×1 IMPLANT
BLADE CLIPPER SURG (BLADE) IMPLANT
BLADE LONG MED 31X9 (MISCELLANEOUS) ×1 IMPLANT
BLADE SURG 15 STRL LF DISP TIS (BLADE) ×1 IMPLANT
BLADE SURG 15 STRL SS (BLADE) ×2
BNDG CMPR 9X4 STRL LF SNTH (GAUZE/BANDAGES/DRESSINGS)
BNDG CMPR 9X6 STRL LF SNTH (GAUZE/BANDAGES/DRESSINGS)
BNDG ESMARK 4X9 LF (GAUZE/BANDAGES/DRESSINGS) IMPLANT
BNDG ESMARK 6X9 LF (GAUZE/BANDAGES/DRESSINGS)
BNDG GAUZE ELAST 4 BULKY (GAUZE/BANDAGES/DRESSINGS) ×3 IMPLANT
CHLORAPREP W/TINT 26ML (MISCELLANEOUS) ×2 IMPLANT
COVER SURGICAL LIGHT HANDLE (MISCELLANEOUS) ×2 IMPLANT
COVER WAND RF STERILE (DRAPES) ×2 IMPLANT
CUFF TOURNIQUET SINGLE 18IN (TOURNIQUET CUFF) IMPLANT
CUFF TOURNIQUET SINGLE 34IN LL (TOURNIQUET CUFF) IMPLANT
DECANTER SPIKE VIAL GLASS SM (MISCELLANEOUS) IMPLANT
DRAPE U-SHAPE 47X51 STRL (DRAPES) ×2 IMPLANT
DRSG EMULSION OIL 3X3 NADH (GAUZE/BANDAGES/DRESSINGS) ×2 IMPLANT
DRSG PAD ABDOMINAL 8X10 ST (GAUZE/BANDAGES/DRESSINGS) ×4 IMPLANT
DURAPREP 26ML APPLICATOR (WOUND CARE) ×2 IMPLANT
ELECT CAUTERY BLADE 6.4 (BLADE) ×2 IMPLANT
ELECT REM PT RETURN 9FT ADLT (ELECTROSURGICAL) ×2
ELECTRODE REM PT RTRN 9FT ADLT (ELECTROSURGICAL) ×1 IMPLANT
GAUZE SPONGE 4X4 12PLY STRL (GAUZE/BANDAGES/DRESSINGS) ×3 IMPLANT
GAUZE XEROFORM 1X8 LF (GAUZE/BANDAGES/DRESSINGS) ×1 IMPLANT
GLOVE BIO SURGEON STRL SZ 6.5 (GLOVE) ×1 IMPLANT
GLOVE BIO SURGEON STRL SZ7.5 (GLOVE) ×2 IMPLANT
GLOVE BIOGEL PI IND STRL 8 (GLOVE) ×2 IMPLANT
GLOVE BIOGEL PI INDICATOR 8 (GLOVE) ×2
GLOVE ECLIPSE 7.5 STRL STRAW (GLOVE) ×4 IMPLANT
GOWN STRL REUS W/ TWL LRG LVL3 (GOWN DISPOSABLE) ×1 IMPLANT
GOWN STRL REUS W/ TWL XL LVL3 (GOWN DISPOSABLE) ×2 IMPLANT
GOWN STRL REUS W/TWL LRG LVL3 (GOWN DISPOSABLE) ×2
GOWN STRL REUS W/TWL XL LVL3 (GOWN DISPOSABLE) ×4
HOLDER KNEE FOAM BLUE (MISCELLANEOUS) IMPLANT
K-WIRE .62 (WIRE) ×1 IMPLANT
K-WIRE 2X4 (WIRE) ×8
K-WIRE SURGICAL 1.6X102 (WIRE) ×1 IMPLANT
KIT BASIN OR (CUSTOM PROCEDURE TRAY) ×2 IMPLANT
KIT TURNOVER KIT B (KITS) ×2 IMPLANT
KWIRE 2X4 (WIRE) IMPLANT
MANIFOLD NEPTUNE II (INSTRUMENTS) ×2 IMPLANT
NDL BIOPSY JAMSHIDI 8X6 (NEEDLE) IMPLANT
NDL HYPO 25GX1X1/2 BEV (NEEDLE) ×1 IMPLANT
NEEDLE BIOPSY JAMSHIDI 8X6 (NEEDLE) IMPLANT
NEEDLE HYPO 22GX1.5 SAFETY (NEEDLE) IMPLANT
NEEDLE HYPO 25GX1X1/2 BEV (NEEDLE) ×2 IMPLANT
NS IRRIG 1000ML POUR BTL (IV SOLUTION) ×2 IMPLANT
PACK ORTHO EXTREMITY (CUSTOM PROCEDURE TRAY) ×2 IMPLANT
PAD ARMBOARD 7.5X6 YLW CONV (MISCELLANEOUS) ×4 IMPLANT
PAD CAST 4YDX4 CTTN HI CHSV (CAST SUPPLIES) ×1 IMPLANT
PADDING CAST COTTON 4X4 STRL (CAST SUPPLIES) ×2
PIN CAPS ORTHO GREEN .062 (PIN) ×1 IMPLANT
PROBE DEBRIDE SONICVAC MISONIX (TIP) IMPLANT
SCREW INCORE TI 3.5X32 (Screw) ×1 IMPLANT
SCREW INCORE TI 3.5X48 (Screw) ×1 IMPLANT
SCREW INCORE TI PT RT 5.9X28 (Screw) ×1 IMPLANT
SCREW LAPIDUS POST PLUG (Screw) ×1 IMPLANT
SCRUB BETADINE 4OZ XXX (MISCELLANEOUS) ×2 IMPLANT
SET CYSTO W/LG BORE CLAMP LF (SET/KITS/TRAYS/PACK) ×2 IMPLANT
SOL PREP POV-IOD 4OZ 10% (MISCELLANEOUS) ×2 IMPLANT
SPONGE LAP 4X18 RFD (DISPOSABLE) ×2 IMPLANT
STRIP CLOSURE SKIN 1/2X4 (GAUZE/BANDAGES/DRESSINGS) ×2 IMPLANT
SUT ETHILON 3 0 PS 1 (SUTURE) ×2 IMPLANT
SUT ETHILON 4 0 PS 2 18 (SUTURE) ×2 IMPLANT
SUT MERSILENE 4 0 P 3 (SUTURE) ×1 IMPLANT
SUT VIC AB 3-0 FS2 27 (SUTURE) ×2 IMPLANT
SYR CONTROL 10ML LL (SYRINGE) ×2 IMPLANT
TOWEL GREEN STERILE (TOWEL DISPOSABLE) ×2 IMPLANT
TOWEL GREEN STERILE FF (TOWEL DISPOSABLE) ×2 IMPLANT
TOWEL OR 17X24 6PK STRL BLUE (TOWEL DISPOSABLE) ×2 IMPLANT
TOWEL OR 17X26 10 PK STRL BLUE (TOWEL DISPOSABLE) ×2 IMPLANT
TUBE CONNECTING 12X1/4 (SUCTIONS) ×2 IMPLANT
WATER STERILE IRR 1000ML POUR (IV SOLUTION) ×2 IMPLANT
YANKAUER SUCT BULB TIP NO VENT (SUCTIONS) ×2 IMPLANT

## 2018-09-08 NOTE — H&P (Signed)
Anesthesia H&P Update: History and Physical Exam reviewed; patient is OK for planned anesthetic and procedure. ? ?

## 2018-09-08 NOTE — Anesthesia Preprocedure Evaluation (Addendum)
Anesthesia Evaluation  Patient identified by MRN, date of birth, ID band Patient awake    Reviewed: Allergy & Precautions, NPO status , Patient's Chart, lab work & pertinent test results  History of Anesthesia Complications (+) PONV  Airway Mallampati: II  TM Distance: >3 FB Neck ROM: Full    Dental  (+) Upper Dentures, Lower Dentures   Pulmonary COPD,  COPD inhaler, former smoker,    Pulmonary exam normal breath sounds clear to auscultation       Cardiovascular hypertension, Pt. on medications Normal cardiovascular exam Rhythm:Regular Rate:Normal  ECG: SR, rate 68   Neuro/Psych Anxiety Peripheral neuropathy  Neuromuscular disease    GI/Hepatic Neg liver ROS, PUD, GERD  Controlled,  Endo/Other  negative endocrine ROS  Renal/GU negative Renal ROS     Musculoskeletal Chronic back pain   Abdominal (+) + obese,   Peds  Hematology HLD   Anesthesia Other Findings capsulitis right foot, hallux abductor valgus, hammertoe, right foot  Reproductive/Obstetrics                            Anesthesia Physical Anesthesia Plan  ASA: III  Anesthesia Plan: General   Post-op Pain Management:    Induction: Intravenous  PONV Risk Score and Plan: 4 or greater and Ondansetron, Dexamethasone, Midazolam and Treatment may vary due to age or medical condition  Airway Management Planned: LMA  Additional Equipment:   Intra-op Plan:   Post-operative Plan: Extubation in OR  Informed Consent: I have reviewed the patients History and Physical, chart, labs and discussed the procedure including the risks, benefits and alternatives for the proposed anesthesia with the patient or authorized representative who has indicated his/her understanding and acceptance.     Dental advisory given  Plan Discussed with: CRNA  Anesthesia Plan Comments: (Multi-modal: acetaminophen and dexamethasone )        Anesthesia Quick Evaluation

## 2018-09-08 NOTE — Discharge Instructions (Signed)

## 2018-09-08 NOTE — Anesthesia Procedure Notes (Signed)
Procedure Name: LMA Insertion Date/Time: 09/08/2018 2:50 PM Performed by: Inda Coke, CRNA Pre-anesthesia Checklist: Patient identified, Emergency Drugs available, Suction available and Patient being monitored Patient Re-evaluated:Patient Re-evaluated prior to induction Oxygen Delivery Method: Circle System Utilized Preoxygenation: Pre-oxygenation with 100% oxygen Induction Type: IV induction Ventilation: Mask ventilation without difficulty LMA: LMA inserted LMA Size: 4.0 Number of attempts: 1 Airway Equipment and Method: Bite block Placement Confirmation: positive ETCO2 Tube secured with: Tape Dental Injury: Teeth and Oropharynx as per pre-operative assessment

## 2018-09-08 NOTE — Transfer of Care (Signed)
Immediate Anesthesia Transfer of Care Note  Patient: [name]  Procedure(s) Performed: HALLUX VALGUS LAPIDUS (Right Foot) METATARSAL OSTEOTOMY 5th digit (Right Foot) HAMMER TOE CORRECTION 2nd digit (Right Foot) CAPSULOTOMY 2nd digit (Right Foot)  Patient Location: PACU  Anesthesia Type:General  Level of Consciousness: awake, alert  and oriented  Airway & Oxygen Therapy: Patient Spontanous Breathing and Patient connected to nasal cannula oxygen  Post-op Assessment: Report given to RN and Post -op Vital signs reviewed and stable  Post vital signs: Reviewed and stable  Last Vitals:  Vitals Value Taken Time  BP 143/81 09/08/2018  6:09 PM  Temp    Pulse 87 09/08/2018  6:10 PM  Resp 17 09/08/2018  6:10 PM  SpO2 97 % 09/08/2018  6:10 PM  Vitals shown include unvalidated device data.  Last Pain:  Vitals:   09/08/18 1216  TempSrc:   PainSc: 3       Patients Stated Pain Goal: 3 (16/60/60 0459)  Complications: No apparent anesthesia complications

## 2018-09-08 NOTE — Brief Op Note (Signed)
09/08/2018  6:10 PM  PATIENT:  [name]  63 y.o. female  PRE-OPERATIVE DIAGNOSIS:  capsulitis right foot, hallux abductor valgus, hammertoe, right foot  POST-OPERATIVE DIAGNOSIS:  capsulitis right foot, hallux abductor valgus, hammertoe, right foot  PROCEDURE:  Procedure(s): HALLUX VALGUS LAPIDUS (Right) METATARSAL OSTEOTOMY 5th digit (Right) HAMMER TOE CORRECTION 2nd digit (Right) CAPSULOTOMY 2nd digit (Right)  SURGEON:  Surgeon(s) and Role:    Evelina Bucy, DPM - Primary  PHYSICIAN ASSISTANT:   ASSISTANTS: none   ANESTHESIA:   local and MAC  EBL:  15 mL   BLOOD ADMINISTERED:none  DRAINS: none   LOCAL MEDICATIONS USED:  MARCAINE    and Amount: 20 ml  SPECIMEN:  none  DISPOSITION OF SPECIMEN:  N/A  COUNTS:  YES  TOURNIQUET:   Total Tourniquet Time Documented: Thigh (Right) - 122 minutes Total: Thigh (Right) - 122 minutes   DICTATION: .Viviann Spare Dictation  PLAN OF CARE: Discharge to home after PACU  PATIENT DISPOSITION:  PACU - hemodynamically stable.   Delay start of Pharmacological VTE agent (>24hrs) due to surgical blood loss or risk of bleeding: not applicable

## 2018-09-08 NOTE — H&P (Signed)
  Subjective:  Patient ID: [name], female    DOB: [birth year],  MRN: [id number]  Patient seen in pre-op. Understands plan for OR today.  Objective:   Vitals:   09/08/18 1157  BP: (!) 155/72  Pulse: 71  Resp: 18  Temp: 98 F (36.7 C)  SpO2: 98%   General AA&O x3. Normal mood and affect.  Vascular Dorsalis pedis and posterior tibial pulses 2/4 bilat. Brisk capillary refill to all digits. Pedal hair present.  Neurologic Epicritic sensation grossly intact.  Dermatologic No open lesions. Interspaces clear of maceration. Nails well groomed and normal in appearance.  Orthopedic: MMT 5/5 in dorsiflexion, plantarflexion, inversion, and eversion. Normal joint ROM without pain or crepitus. HAV Right foot, 2nd hammertoe POP R 5th MPJ    Assessment & Plan:  Patient was evaluated and treated and all questions answered.  HAV Right Foot, 2nd Hammertoes, Capsulitis 5th Metatarsal Head -To OR today for Lapidus bunionectomy right foot, revision hammertoe 2nd toe, excision of right 5th metatarsal head. - Consent reviewed, RLE marked.

## 2018-09-08 NOTE — Anesthesia Postprocedure Evaluation (Signed)
Anesthesia Post Note  Patient: [name]  Procedure(s) Performed: HALLUX VALGUS LAPIDUS (Right Foot) METATARSAL OSTEOTOMY 5th digit (Right Foot) HAMMER TOE CORRECTION 2nd digit (Right Foot) CAPSULOTOMY 2nd digit (Right Foot)     Patient location during evaluation: PACU Anesthesia Type: General Level of consciousness: sedated and patient cooperative Pain management: pain level controlled Vital Signs Assessment: post-procedure vital signs reviewed and stable Respiratory status: spontaneous breathing Cardiovascular status: stable Anesthetic complications: no    Last Vitals:  Vitals:   09/08/18 1838 09/08/18 1854  BP: 134/80 (!) 151/83  Pulse: 81 80  Resp: 13   Temp:    SpO2: 100% 98%    Last Pain:  Vitals:   09/08/18 1854  TempSrc:   PainSc: 0-No pain                 Nolon Nations

## 2018-09-10 ENCOUNTER — Telehealth: Payer: Self-pay | Admitting: Podiatry

## 2018-09-10 DIAGNOSIS — M25374 Other instability, right foot: Secondary | ICD-10-CM

## 2018-09-10 DIAGNOSIS — M21611 Bunion of right foot: Secondary | ICD-10-CM

## 2018-09-10 DIAGNOSIS — M2011 Hallux valgus (acquired), right foot: Secondary | ICD-10-CM

## 2018-09-10 DIAGNOSIS — Z969 Presence of functional implant, unspecified: Secondary | ICD-10-CM

## 2018-09-10 DIAGNOSIS — M2041 Other hammer toe(s) (acquired), right foot: Secondary | ICD-10-CM

## 2018-09-10 DIAGNOSIS — M7751 Other enthesopathy of right foot: Secondary | ICD-10-CM

## 2018-09-10 DIAGNOSIS — M6240 Contracture of muscle, unspecified site: Secondary | ICD-10-CM

## 2018-09-10 NOTE — Op Note (Signed)
Patient Name: [name] DOB: [birth year]  MRN: [id number]   Date of Service: 09/08/2018   Surgeon: Dr. Hardie Pulley, DPM Assistants: None Pre-operative Diagnosis: Hallux abductovalgus deformity, tarsometatarsal joint instability, second hammertoe, capsulitis second MPJ, tendon contracture second MPJ, retained hardware Post-operative Diagnosis: Same Procedures:             1) Lapidus bunionectomy right  2) Removal of Deep Hardware  3) 2nd Hammertoe Revision  4) 2nd MPJ Capsulotomy and Tenotomy  5) 5th metatarsal head excision Pathology/Specimens: None Anesthesia: MAC local with 20cc 0.5% marcaine plain ankle block Hemostasis: Anatomic Estimated Blood Loss: 10 ml Materials:  Implant Name Type Inv. Item Serial No. Manufacturer Lot No. LRB No. Used  InCore Lapidus 5.61m x 273mRight Titanium Post    [employer] [id number] 1  InCore Lapidus 3.64m2m 15m37mtanium screw    [employer] [id number] 1  InCore Lapidus 3.64mm 764m8mm 54mnium screw    [employer] [id number] Right 1  InCore Lapidus post plug screw    [employer] [id number] Right 1   Medications: none Complications: None  Indications for Procedure:  This is a 62 y.o42female with a painful recurrent bunion deformity to the right foot, and painful hammertoe of the second toe. She previously underwent bunion correction and hammertoe correction and was nonweightbearing but walked earlier during her postoperative course.  She had early tendon loosening and recurrence of her bunion deformity.  It was discussed the patient that the instability at her first tarsometatarsal joint was likely contributing to the recurrence of her bunion deformity and thus Lapidus bunionectomy is indicated.  Patient presents today for revision surgery all risk benefits alternatives of surgery explained to patient no guarantees were given.   Procedure in Detail: Patient was identified in pre-operative holding area. Formal  consent was signed and the right lower extremity was marked. Patient was brought back to the operating room and placed on the operating room table in the supine position. Anesthesia was induced.   The extremity was prepped and draped in the usual sterile fashion. Timeout was taken to confirm patient name, laterality, and procedure prior to incision. Attention was then directed to the right foot.   Lapidus Bunionectomy  Attention was then directed to the right first tarsometatarsal joint where fluoroscopy was used to identify the landmarks about the joint.  The joint was brought through its range of motion and was grossly excessively hypermobile.  A dorsomedial incision was made overlying the tarsometatarsal joint.  Dissection was carried down through skin and subcu tissue with care to avoid all vital neurovascular structures all bleeders were cauterized with the cautery.  Dissection was carried to the level of the medial cuneiform and first tarsometatarsal joint capsule the joint capsule was incised with a linear capsulotomy gently.  The bone the periosteum was freed all the way to the anterior cuneiform joint.  At this point we determined to proceed with planned Lapidus fixation with the [employer].  The proprietary positioning paddle was applied to the intercuneiform joint and tendon position to identify the medial aspect of the medial cuneiform for drilling of the toes.  This was checked arthroscopy and shown to be adequate.  The post was then reamed with the appropriate drill bit.  The cancellus bone was set aside for later bone grafting.  The post and shaving was then applied to the medial cuneiform.  A separate pin was fired metatarsal about the metatarsal base.  Under fluoroscopy  the first metatarsal was rotated to correct frontal plane alignment and to realign sesamoids.  Fluoroscopy confirmed adequate correction. This was pinned in position with a K wire through the jig.At this  point a large tenaculum was used to wrap around the first and fifth metatarsals to compress the intermetatarsal deformity this was checked on fluoroscopy to show adequate reduction of deformity. The jig was then expanded to distract the joint and the joint was prepped with curettage after doing all cartilage of both sides of the joint the joint was microfractured and fish scaled with a osteotome.  The joint was then brought to neutral position.  The joint was sequentially reciprocally planed and the joint proper closed together in order to provide a congruent joint surface for fusion.  Once adequate joint preparation was performed the joint was irrigated of all debris and the bone graft was applied to the joint.  The joint was then compressed through the jig with good compression noted.  At this point the orthopedist that was ready for fixation.  Two screws were sequentially drilled into the post using the targeting guides with good seating into the bone and post noted.  This was checked on fluoroscopy.  Good compression of the joint was noted good stability of the joint was noted and the construct was stable.  At this point the jig was removed and the post cap was inserted.  Removal of Hardware Attention was then directed to the distal metatarsal head area where dissection was continued through the capsule about the area of the prior exposed hardware.  The hardware was gently exposed and removed with the appropriate screw driver to prevent pain and prominence.  Capsulorrhaphy was performed in order to assist in correction of HAV deformity  2nd Hammertoe Revision Attention was then directed to the second toe where a linear incision was made overlying the second toe.  Dissection was carried down through skin and subcu tissue with care to avoid all vital neurovascular structures all the trochars electrocautery.  Dissection was carried down to the proximal interphalangeal joint capsule a linear incision was  made in the periosteum.  Dissection was carried down to the area of the previous resection and nonunion.  The fibrous joint was excised with a sagittal saw.  The toe was not excessively shortened and the toe itself was allowed to lay straight but there was still dorsiflexion deformity at the MPJ  2nd MPJ Capsulotomy and Tenotomy Due to residual contracture at the metatarsophalangeal joint  attention was then directed to the metatarsal phalangeal joint incision was made overlying this area and dissection was carried down to the MPJ capsule and tendon.  A transverse incision was made over the capsule and tendon to release the capsule and tendon to allow for release of the dorsal flexion deformity of the toe.  At this time the toe was allowed to lay rectus.  The toe was then fixated in rectus position with a 0.062 K wire in retrograde fashion and brought back onto the metatarsal bone for temporary fixation.  5th Metatarsal Head Excision Attention was then directed to the lateral aspect of the foot where a linear incision was made over the area of her previous incision dissection was carried down through skin and subcutaneous tissue with care to avoid all vital neurovascular structures all wounds were cauterized electrocautery.  Dissection was carried down to the metatarsal joint capsule linear incision was made in metatarsal capsule and the periosteum was gently freed from the bone the collateral  ligaments were freed at the metatarsophalangeal joint and the metatarsal head was exposed metatarsal was then excised with a sagittal saw and removed.  Fluoroscopy was used to confirm adequate resection of the metatarsal head.    All incisions were then copiously irrigated.  Capsules were closed with 0 Vicryl subcutaneous tissue with 3-0 and 4-0 Vicryl and skin with 4-0 nylon. The foot was then dressed with xeroform, 4x4, kerlix, and ACE bandage. Patient tolerated the procedure well.   Disposition: Following a  period of post-operative monitoring, patient will be transferred back home.

## 2018-09-10 NOTE — Telephone Encounter (Signed)
Called patient for post-op check.  Having expected post-op pain, controlled with medication. Icing, elevating as directed. No post-op issues thus far. Will see patient for scheduled f/u this week.

## 2018-09-12 ENCOUNTER — Encounter (HOSPITAL_COMMUNITY): Payer: Self-pay | Admitting: Podiatry

## 2018-09-15 ENCOUNTER — Ambulatory Visit (INDEPENDENT_AMBULATORY_CARE_PROVIDER_SITE_OTHER): Payer: PRIVATE HEALTH INSURANCE | Admitting: Podiatry

## 2018-09-15 ENCOUNTER — Ambulatory Visit (INDEPENDENT_AMBULATORY_CARE_PROVIDER_SITE_OTHER): Payer: PRIVATE HEALTH INSURANCE

## 2018-09-15 ENCOUNTER — Encounter: Payer: Self-pay | Admitting: Podiatry

## 2018-09-15 VITALS — BP 133/79 | HR 68 | Temp 98.0°F | Resp 16

## 2018-09-15 DIAGNOSIS — M21611 Bunion of right foot: Secondary | ICD-10-CM

## 2018-09-15 DIAGNOSIS — M2011 Hallux valgus (acquired), right foot: Secondary | ICD-10-CM

## 2018-09-15 MED ORDER — CEPHALEXIN 500 MG PO CAPS: 500.0000 mg | ORAL_CAPSULE | Freq: Two times a day (BID) | ORAL | 0 refills | Status: DC

## 2018-09-15 MED ORDER — OXYCODONE-ACETAMINOPHEN 10-325 MG PO TABS: 1.0000 | ORAL_TABLET | ORAL | 0 refills | Status: DC | PRN

## 2018-09-16 NOTE — Progress Notes (Signed)
Subjective:  Patient ID: [name], female    DOB: Aug 26, 1955,  MRN: [id number]  Chief Complaint  Patient presents with  . Routine Post Op    POV#1 DOS 09/07/18 LAPIDUS INCLUDING BUNIONECTOMY, METATARSAL OSTEOTOMY 5TH, AND HAMMER TOE REPAIR 2ND RT PT. states," it burns a lot like it's on fire; 9/10 constant burning." Tx: abx, and oxycodone -w/ nausea since Sx -pt denies V/F/CH    DOS: 09/07/2018 Procedure:  1) Lapidus bunionectomy right             2) Removal of Deep Hardware             3) 2nd Hammertoe Revision             4) 2nd MPJ Capsulotomy and Tenotomy             5) 5th metatarsal head excision  63 y.o. female returns for post-op check.  History as above confirmed with patient  Review of Systems: Negative except as noted in the HPI. Denies N/V/F/Ch.  Past Medical History:  Diagnosis Date  . Anxiety   . Arthritis   . Cancer ([place]) 1992   cervical  . Chronic back pain   . COPD (chronic obstructive pulmonary disease) ([place])   . Diverticulosis   . Fluid retention   . GERD (gastroesophageal reflux disease)   . High cholesterol   . Hypertension   . Hypokalemia   . Peptic ulcer   . Peripheral neuropathy   . Pneumonia    hospitalized in March 2016   . PONV (postoperative nausea and vomiting)   . Pre-diabetes    before bariatric surgery    Current Outpatient Medications:  .  ALPRAZolam (XANAX) 0.25 MG tablet, Take 0.25 mg by mouth at bedtime as needed for anxiety., Disp: , Rfl:  .  amitriptyline (ELAVIL) 25 MG tablet, Take 25 mg by mouth at bedtime. , Disp: , Rfl:  .  amLODipine (NORVASC) 5 MG tablet, Take 5 mg by mouth daily., Disp: , Rfl:  .  atorvastatin (LIPITOR) 40 MG tablet, Take 40 mg by mouth daily., Disp: , Rfl:  .  Calcium Carbonate-Vitamin D 600-400 MG-UNIT chew tablet, Chew 1 tablet by mouth 4 (four) times daily. , Disp: , Rfl:  .  cephALEXin (KEFLEX) 500 MG capsule, Take 1 capsule (500 mg total) by mouth 2 (two) times daily., Disp: 14  capsule, Rfl: 0 .  cholecalciferol (VITAMIN D3) 25 MCG (1000 UT) tablet, Take 4,000 Units by mouth daily., Disp: , Rfl:  .  Fluocinonide Emulsified Base 0.05 % CREA, APPLY TO THE AFFECTED AREA(S) TWICE DAILY, Disp: , Rfl:  .  fluticasone (FLONASE) 50 MCG/ACT nasal spray, Place 1 spray into the nose 2 (two) times daily as needed (for allergies/throat irritation). , Disp: , Rfl:  .  gabapentin (NEURONTIN) 600 MG tablet, Take 600 mg by mouth 3 (three) times daily., Disp: , Rfl: 1 .  hydrochlorothiazide (HYDRODIURIL) 25 MG tablet, Take 25 mg by mouth daily., Disp: , Rfl:  .  HYDROcodone-acetaminophen (NORCO) 10-325 MG tablet, Take 1 tablet by mouth every 4 (four) hours. , Disp: , Rfl: 0 .  Linaclotide (LINZESS) 290 MCG CAPS capsule, Take 1 capsule (290 mcg total) by mouth daily. (Patient taking differently: Take 290 mcg by mouth 2 (two) times a week. ), Disp: 30 capsule, Rfl: 11 .  MOVANTIK 25 MG TABS tablet, Take 25 mg by mouth 2 (two) times a week. , Disp: , Rfl: 11 .  Multiple Vitamins-Minerals (MULTIVITAMIN  GUMMIES WOMENS) CHEW, Chew 1 tablet by mouth daily. Flintstone's chewable, Disp: , Rfl:  .  olmesartan (BENICAR) 20 MG tablet, Take 20 mg by mouth daily., Disp: , Rfl:  .  oxyCODONE-acetaminophen (PERCOCET) 10-325 MG tablet, Take 1 tablet by mouth every 4 (four) hours as needed for pain., Disp: 20 tablet, Rfl: 0  Social History   Tobacco Use  Smoking Status Former Smoker  . Packs/day: 0.50  . Years: 43.00  . Pack years: 21.50  . Types: Cigarettes  . Last attempt to quit: 03/26/2018  . Years since quitting: 0.4  Smokeless Tobacco Never Used    Allergies  Allergen Reactions  . Clindamycin/Lincomycin Swelling    Swelling feet  . Codeine Itching  . Levaquin [Levofloxacin] Swelling  . Nsaids     Needs to avoid d/t peptic ulcer   Objective:   Vitals:   09/15/18 1311  BP: 133/79  Pulse: 68  Resp: 16  Temp: 98 F (36.7 C)   There is no height or weight on file to calculate  BMI. Constitutional Well developed. Well nourished.  Vascular Foot warm and well perfused. Capillary refill normal to all digits.   Neurologic Normal speech. Oriented to person, place, and time. Epicritic sensation to light touch grossly present bilaterally.  Dermatologic Skin healing well without signs of infection. Skin edges well coapted without signs of infection.  Orthopedic: Tenderness to palpation noted about the surgical site.  Hallux rectus pin intact second toe straight   Radiographs: Taken and reviewed consistent with postop state reduced HAV deformity interval removal of hardware hallux rectus pin fixation noted to second toe resection of fifth metatarsal head noted Assessment:   1. Hallux valgus with bunions of right foot    Plan:  Patient was evaluated and treated and all questions answered.  S/p foot surgery right -Progressing as expected post-operatively. -XR: As above -WB Status: Nonweightbearing in knee scooter -Sutures: Intact. -Medications: Percocet and Keflex refilled -Foot redressed.  No follow-ups on file.

## 2018-09-22 ENCOUNTER — Ambulatory Visit (INDEPENDENT_AMBULATORY_CARE_PROVIDER_SITE_OTHER): Payer: PRIVATE HEALTH INSURANCE | Admitting: Podiatry

## 2018-09-22 DIAGNOSIS — Z9889 Other specified postprocedural states: Secondary | ICD-10-CM

## 2018-09-22 MED ORDER — OXYCODONE-ACETAMINOPHEN 10-325 MG PO TABS: 1.0000 | ORAL_TABLET | ORAL | 0 refills | Status: DC | PRN

## 2018-09-22 NOTE — Progress Notes (Signed)
Subjective:  Patient ID: [name], female    DOB: 1956-07-29,  MRN: [id number]  Chief Complaint  Patient presents with  . Routine Post Op      POV#2 DOS 09/07/18 LAPIDUS INCLUDING BUNIONECTOMY, METATARSAL OSTEOTOMY 5TH, AND HAMMER TOE REPAIR 2ND RT   DOS: 09/07/2018 Procedure:  1) Lapidus bunionectomy right             2) Removal of Deep Hardware             3) 2nd Hammertoe Revision             4) 2nd MPJ Capsulotomy and Tenotomy             5) 5th metatarsal head excision  63 y.o. female returns for post-op check.  History as above confirmed with patient. Still having burning but it is improving. Denies new issues.  Review of Systems: Negative except as noted in the HPI. Denies N/V/F/Ch.  Past Medical History:  Diagnosis Date  . Anxiety   . Arthritis   . Cancer ([place]) 1992   cervical  . Chronic back pain   . COPD (chronic obstructive pulmonary disease) ([place])   . Diverticulosis   . Fluid retention   . GERD (gastroesophageal reflux disease)   . High cholesterol   . Hypertension   . Hypokalemia   . Peptic ulcer   . Peripheral neuropathy   . Pneumonia    hospitalized in March 2016   . PONV (postoperative nausea and vomiting)   . Pre-diabetes    before bariatric surgery    Current Outpatient Medications:  .  ALPRAZolam (XANAX) 0.25 MG tablet, Take 0.25 mg by mouth at bedtime as needed for anxiety., Disp: , Rfl:  .  amitriptyline (ELAVIL) 25 MG tablet, Take 25 mg by mouth at bedtime. , Disp: , Rfl:  .  amLODipine (NORVASC) 5 MG tablet, Take 5 mg by mouth daily., Disp: , Rfl:  .  atorvastatin (LIPITOR) 40 MG tablet, Take 40 mg by mouth daily., Disp: , Rfl:  .  Calcium Carbonate-Vitamin D 600-400 MG-UNIT chew tablet, Chew 1 tablet by mouth 4 (four) times daily. , Disp: , Rfl:  .  cephALEXin (KEFLEX) 500 MG capsule, Take 1 capsule (500 mg total) by mouth 2 (two) times daily., Disp: 14 capsule, Rfl: 0 .  cholecalciferol (VITAMIN D3) 25 MCG (1000 UT) tablet,  Take 4,000 Units by mouth daily., Disp: , Rfl:  .  Fluocinonide Emulsified Base 0.05 % CREA, APPLY TO THE AFFECTED AREA(S) TWICE DAILY, Disp: , Rfl:  .  fluticasone (FLONASE) 50 MCG/ACT nasal spray, Place 1 spray into the nose 2 (two) times daily as needed (for allergies/throat irritation). , Disp: , Rfl:  .  gabapentin (NEURONTIN) 600 MG tablet, Take 600 mg by mouth 3 (three) times daily., Disp: , Rfl: 1 .  hydrochlorothiazide (HYDRODIURIL) 25 MG tablet, Take 25 mg by mouth daily., Disp: , Rfl:  .  HYDROcodone-acetaminophen (NORCO) 10-325 MG tablet, Take 1 tablet by mouth every 4 (four) hours. , Disp: , Rfl: 0 .  Linaclotide (LINZESS) 290 MCG CAPS capsule, Take 1 capsule (290 mcg total) by mouth daily. (Patient taking differently: Take 290 mcg by mouth 2 (two) times a week. ), Disp: 30 capsule, Rfl: 11 .  MOVANTIK 25 MG TABS tablet, Take 25 mg by mouth 2 (two) times a week. , Disp: , Rfl: 11 .  Multiple Vitamins-Minerals (MULTIVITAMIN GUMMIES WOMENS) CHEW, Chew 1 tablet by mouth daily. [employer]'s chewable, Disp: ,  Rfl:  .  olmesartan (BENICAR) 20 MG tablet, Take 20 mg by mouth daily., Disp: , Rfl:  .  oxyCODONE-acetaminophen (PERCOCET) 10-325 MG tablet, Take 1 tablet by mouth every 4 (four) hours as needed for pain., Disp: 20 tablet, Rfl: 0  Social History   Tobacco Use  Smoking Status Former Smoker  . Packs/day: 0.50  . Years: 43.00  . Pack years: 21.50  . Types: Cigarettes  . Last attempt to quit: 03/26/2018  . Years since quitting: 0.4  Smokeless Tobacco Never Used    Allergies  Allergen Reactions  . Clindamycin/Lincomycin Swelling    Swelling feet  . Codeine Itching  . Levaquin [Levofloxacin] Swelling  . Nsaids     Needs to avoid d/t peptic ulcer   Objective:   There were no vitals filed for this visit. There is no height or weight on file to calculate BMI. Constitutional Well developed. Well nourished.  Vascular Foot warm and well perfused. Capillary refill normal to  all digits.   Neurologic Normal speech. Oriented to person, place, and time. Epicritic sensation to light touch grossly present bilaterally.  Dermatologic Skin healing well without signs of infection. Skin edges well coapted without signs of infection.  Orthopedic: Tenderness to palpation noted about the surgical site.  Hallux rectus pin intact second toe straight   Radiographs: None Assessment:   1. Post-operative state    Plan:  Patient was evaluated and treated and all questions answered.  S/p foot surgery right -Progressing as expected post-operatively. -XR: As above -WB Status: Nonweightbearing in knee scooter -Sutures: Intact. -Medications: Percocet refilled -Foot redressed.  Return in about 1 week (around 09/29/2018).  Likely suture removal at that time.

## 2018-09-25 ENCOUNTER — Encounter: Payer: PRIVATE HEALTH INSURANCE | Admitting: Podiatry

## 2018-09-25 ENCOUNTER — Ambulatory Visit: Payer: Self-pay

## 2018-09-28 ENCOUNTER — Ambulatory Visit (INDEPENDENT_AMBULATORY_CARE_PROVIDER_SITE_OTHER): Payer: PRIVATE HEALTH INSURANCE

## 2018-09-28 ENCOUNTER — Ambulatory Visit (INDEPENDENT_AMBULATORY_CARE_PROVIDER_SITE_OTHER): Payer: PRIVATE HEALTH INSURANCE | Admitting: Podiatry

## 2018-09-28 ENCOUNTER — Encounter: Payer: Self-pay | Admitting: Podiatry

## 2018-09-28 DIAGNOSIS — M21611 Bunion of right foot: Secondary | ICD-10-CM | POA: Diagnosis not present

## 2018-09-28 DIAGNOSIS — M2011 Hallux valgus (acquired), right foot: Secondary | ICD-10-CM | POA: Diagnosis not present

## 2018-09-28 DIAGNOSIS — Z9889 Other specified postprocedural states: Secondary | ICD-10-CM

## 2018-09-28 MED ORDER — OXYCODONE-ACETAMINOPHEN 10-325 MG PO TABS: 1.0000 | ORAL_TABLET | ORAL | 0 refills | Status: DC | PRN

## 2018-09-29 ENCOUNTER — Other Ambulatory Visit (HOSPITAL_COMMUNITY): Payer: Self-pay | Admitting: Internal Medicine

## 2018-09-29 ENCOUNTER — Ambulatory Visit (HOSPITAL_COMMUNITY)
Admission: RE | Admit: 2018-09-29 | Discharge: 2018-09-29 | Disposition: A | Discharge status: Home / Self Care | Payer: PRIVATE HEALTH INSURANCE | Source: Ambulatory Visit | Attending: Internal Medicine | Admitting: Internal Medicine

## 2018-09-29 ENCOUNTER — Encounter: Payer: PRIVATE HEALTH INSURANCE | Admitting: Podiatry

## 2018-09-29 DIAGNOSIS — M545 Low back pain, unspecified: Secondary | ICD-10-CM

## 2018-10-06 ENCOUNTER — Encounter: Payer: Self-pay | Admitting: Podiatry

## 2018-10-06 ENCOUNTER — Ambulatory Visit (INDEPENDENT_AMBULATORY_CARE_PROVIDER_SITE_OTHER): Payer: PRIVATE HEALTH INSURANCE | Admitting: Podiatry

## 2018-10-06 DIAGNOSIS — Z9889 Other specified postprocedural states: Secondary | ICD-10-CM

## 2018-10-06 MED ORDER — OXYCODONE-ACETAMINOPHEN 10-325 MG PO TABS: 1.0000 | ORAL_TABLET | ORAL | 0 refills | Status: DC | PRN

## 2018-10-12 ENCOUNTER — Ambulatory Visit (INDEPENDENT_AMBULATORY_CARE_PROVIDER_SITE_OTHER): Payer: PRIVATE HEALTH INSURANCE | Admitting: Podiatry

## 2018-10-12 ENCOUNTER — Ambulatory Visit (INDEPENDENT_AMBULATORY_CARE_PROVIDER_SITE_OTHER): Payer: PRIVATE HEALTH INSURANCE

## 2018-10-12 DIAGNOSIS — M2011 Hallux valgus (acquired), right foot: Secondary | ICD-10-CM | POA: Diagnosis not present

## 2018-10-12 DIAGNOSIS — M21611 Bunion of right foot: Secondary | ICD-10-CM

## 2018-10-12 MED ORDER — OXYCODONE-ACETAMINOPHEN 5-325 MG PO TABS: 1.0000 | ORAL_TABLET | ORAL | 0 refills | Status: DC | PRN

## 2018-10-12 NOTE — Progress Notes (Signed)
Subjective:  Patient ID: [name], female    DOB: [birth year],  MRN: [id number]  Chief Complaint  Patient presents with  . Routine Post Op    routine post op visit, pt is feeling better, but foot still hurts, pt states that meds and boot has helped tremondously   DOS: 09/07/2018 Procedure:  1) Lapidus bunionectomy right             2) Removal of Deep Hardware             3) 2nd Hammertoe Revision             4) 2nd MPJ Capsulotomy and Tenotomy             5) 5th metatarsal head excision  63 y.o. female returns for post-op check.  History as above confirmed with patient.   Review of Systems: Negative except as noted in the HPI. Denies N/V/F/Ch.  Past Medical History:  Diagnosis Date  . Anxiety   . Arthritis   . Cancer ([place]) 1992   cervical  . Chronic back pain   . COPD (chronic obstructive pulmonary disease) ([place])   . Diverticulosis   . Fluid retention   . GERD (gastroesophageal reflux disease)   . High cholesterol   . Hypertension   . Hypokalemia   . Peptic ulcer   . Peripheral neuropathy   . Pneumonia    hospitalized in March 2016   . PONV (postoperative nausea and vomiting)   . Pre-diabetes    before bariatric surgery    Current Outpatient Medications:  .  ALPRAZolam (XANAX) 0.25 MG tablet, Take 0.25 mg by mouth at bedtime as needed for anxiety., Disp: , Rfl:  .  amitriptyline (ELAVIL) 25 MG tablet, Take 25 mg by mouth at bedtime. , Disp: , Rfl:  .  amLODipine (NORVASC) 5 MG tablet, Take 5 mg by mouth daily., Disp: , Rfl:  .  atorvastatin (LIPITOR) 40 MG tablet, Take 40 mg by mouth daily., Disp: , Rfl:  .  Calcium Carbonate-Vitamin D 600-400 MG-UNIT chew tablet, Chew 1 tablet by mouth 4 (four) times daily. , Disp: , Rfl:  .  cholecalciferol (VITAMIN D3) 25 MCG (1000 UT) tablet, Take 4,000 Units by mouth daily., Disp: , Rfl:  .  Fluocinonide Emulsified Base 0.05 % CREA, APPLY TO THE AFFECTED AREA(S) TWICE DAILY, Disp: , Rfl:  .  fluticasone  (FLONASE) 50 MCG/ACT nasal spray, Place 1 spray into the nose 2 (two) times daily as needed (for allergies/throat irritation). , Disp: , Rfl:  .  gabapentin (NEURONTIN) 600 MG tablet, Take 600 mg by mouth 3 (three) times daily., Disp: , Rfl: 1 .  hydrochlorothiazide (HYDRODIURIL) 25 MG tablet, Take 25 mg by mouth daily., Disp: , Rfl:  .  HYDROcodone-acetaminophen (NORCO) 10-325 MG tablet, Take 1 tablet by mouth every 4 (four) hours. , Disp: , Rfl: 0 .  Linaclotide (LINZESS) 290 MCG CAPS capsule, Take 1 capsule (290 mcg total) by mouth daily. (Patient taking differently: Take 290 mcg by mouth 2 (two) times a week. ), Disp: 30 capsule, Rfl: 11 .  MOVANTIK 25 MG TABS tablet, Take 25 mg by mouth 2 (two) times a week. , Disp: , Rfl: 11 .  Multiple Vitamins-Minerals (MULTIVITAMIN GUMMIES WOMENS) CHEW, Chew 1 tablet by mouth daily. [employer]stone's chewable, Disp: , Rfl:  .  olmesartan (BENICAR) 20 MG tablet, Take 20 mg by mouth daily., Disp: , Rfl:  .  pantoprazole (PROTONIX) 40 MG tablet, Take 40 mg  by mouth daily., Disp: , Rfl:  .  tiZANidine (ZANAFLEX) 4 MG tablet, TAKE 1 TABLET BY MOUTH FOUR TIMES DAILY AS NEEDED FOR SPASMS, Disp: , Rfl:  .  oxyCODONE-acetaminophen (PERCOCET) 5-325 MG tablet, Take 1 tablet by mouth every 4 (four) hours as needed for severe pain., Disp: 20 tablet, Rfl: 0  Social History   Tobacco Use  Smoking Status Former Smoker  . Packs/day: 0.50  . Years: 43.00  . Pack years: 21.50  . Types: Cigarettes  . Last attempt to quit: 03/26/2018  . Years since quitting: 0.5  Smokeless Tobacco Never Used    Allergies  Allergen Reactions  . Clindamycin/Lincomycin Swelling    Swelling feet  . Codeine Itching  . Levaquin [Levofloxacin] Swelling  . Nsaids     Needs to avoid d/t peptic ulcer   Objective:   There were no vitals filed for this visit. There is no height or weight on file to calculate BMI. Constitutional Well developed. Well nourished.  Vascular Foot warm and well  perfused. Capillary refill normal to all digits.   Neurologic Normal speech. Oriented to person, place, and time. Epicritic sensation to light touch grossly present bilaterally.  Dermatologic Skin well-healed  Orthopedic:  No tenderness about the surgical sites.  Second toe rectus   Radiographs: Taken and reviewed osteotomy appears to be bridging but not completely healed, second toe appears to be in good position Assessment:   1. Hallux valgus with bunions of right foot    Plan:  Patient was evaluated and treated and all questions answered.  S/p foot surgery right -Progressing as expected post-operatively. -XR: As above -Pin pulled from second toe completely -WB Status: Nonweightbearing.  Patient returned to his knee scooter advised her to use a walker or crutches -Digital alignment splints dispensed and applied -Sutures: Out -Medications: Percocet refilled -Foot redressed.  Return in about 2 weeks (around 10/26/2018) for XRs, Post-op, Right.

## 2018-10-28 NOTE — Progress Notes (Signed)
This encounter was created in error - please disregard.

## 2018-11-03 ENCOUNTER — Ambulatory Visit (INDEPENDENT_AMBULATORY_CARE_PROVIDER_SITE_OTHER): Payer: PRIVATE HEALTH INSURANCE

## 2018-11-03 ENCOUNTER — Ambulatory Visit (INDEPENDENT_AMBULATORY_CARE_PROVIDER_SITE_OTHER): Payer: PRIVATE HEALTH INSURANCE | Admitting: Podiatry

## 2018-11-03 ENCOUNTER — Encounter: Payer: Self-pay | Admitting: Podiatry

## 2018-11-03 ENCOUNTER — Other Ambulatory Visit: Payer: Self-pay

## 2018-11-03 DIAGNOSIS — Z9889 Other specified postprocedural states: Secondary | ICD-10-CM

## 2018-11-03 DIAGNOSIS — M21611 Bunion of right foot: Secondary | ICD-10-CM

## 2018-11-03 DIAGNOSIS — M21621 Bunionette of right foot: Secondary | ICD-10-CM

## 2018-11-03 DIAGNOSIS — M2011 Hallux valgus (acquired), right foot: Secondary | ICD-10-CM

## 2018-11-03 DIAGNOSIS — M2041 Other hammer toe(s) (acquired), right foot: Secondary | ICD-10-CM | POA: Diagnosis not present

## 2018-11-07 ENCOUNTER — Other Ambulatory Visit: Payer: Self-pay | Admitting: Dermatology

## 2018-11-07 DIAGNOSIS — C4491 Basal cell carcinoma of skin, unspecified: Secondary | ICD-10-CM

## 2018-11-07 HISTORY — DX: Basal cell carcinoma of skin, unspecified: C44.91

## 2018-11-24 DIAGNOSIS — G894 Chronic pain syndrome: Secondary | ICD-10-CM | POA: Diagnosis not present

## 2018-11-24 DIAGNOSIS — I10 Essential (primary) hypertension: Secondary | ICD-10-CM | POA: Diagnosis not present

## 2018-12-05 NOTE — Progress Notes (Signed)
Subjective:  Patient ID: [name], female    DOB: 07/05/1956,  MRN: [id number]  Chief Complaint  Patient presents with  . Routine Post Op    DOS 09/07/18 LAPIDUS INCLUDING BUNIONECTOMY, METATARSAL OSTEOTOMY 5TH, AND HAMMER TOE REPAIR 2ND RT   "Its doing good"   DOS: 09/07/2018 Procedure:  1) Lapidus bunionectomy right             2) Removal of Deep Hardware             3) 2nd Hammertoe Revision             4) 2nd MPJ Capsulotomy and Tenotomy             5) 5th metatarsal head excision  63 y.o. female returns for post-op check.  History as above confirmed with patient.   Review of Systems: Negative except as noted in the HPI. Denies N/V/F/Ch.  Past Medical History:  Diagnosis Date  . Anxiety   . Arthritis   . Cancer ([place]) 1992   cervical  . Chronic back pain   . COPD (chronic obstructive pulmonary disease) ([place])   . Diverticulosis   . Fluid retention   . GERD (gastroesophageal reflux disease)   . High cholesterol   . Hypertension   . Hypokalemia   . Peptic ulcer   . Peripheral neuropathy   . Pneumonia    hospitalized in March 2016   . PONV (postoperative nausea and vomiting)   . Pre-diabetes    before bariatric surgery  . 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  ALPRAZolam (XANAX) 0.25 MG tablet, Take 0.25 mg by mouth at bedtime as needed for anxiety., Disp: , Rfl:  .  amitriptyline (ELAVIL) 25 MG tablet, Take 25 mg by mouth at bedtime. , Disp: , Rfl:  .  amLODipine (NORVASC) 5 MG tablet, Take 5 mg by mouth daily., Disp: , Rfl:  .  atorvastatin (LIPITOR) 40 MG tablet, Take 40 mg by mouth daily., Disp: , Rfl:  .  Calcium Carbonate-Vitamin D 600-400 MG-UNIT chew tablet, Chew 1 tablet by mouth 4 (four) times daily. , Disp: , Rfl:  .  cholecalciferol (VITAMIN D3) 25 MCG (1000 UT) tablet, Take 4,000 Units by mouth daily., Disp: , Rfl:  .  Fluocinonide Emulsified Base 0.05 % CREA, APPLY  TO THE AFFECTED AREA(S) TWICE DAILY, Disp: , Rfl:  .  fluticasone (FLONASE) 50 MCG/ACT nasal spray, Place 1 spray into the nose 2 (two) times daily as needed (for allergies/throat irritation). , Disp: , Rfl:  .  gabapentin (NEURONTIN) 600 MG tablet, Take 600 mg by mouth 3 (three) times daily., Disp: , Rfl: 1 .  hydrochlorothiazide (HYDRODIURIL) 25 MG tablet, Take 25 mg by mouth daily., Disp: , Rfl:  .  HYDROcodone-acetaminophen (NORCO) 10-325 MG tablet, Take 1 tablet by mouth every 4 (four) hours. , Disp: , Rfl: 0 .  Linaclotide (LINZESS) 290 MCG CAPS capsule, Take 1 capsule (290 mcg total) by mouth daily. (Patient taking differently: Take 290 mcg by mouth 2 (two) times a week. ), Disp: 30 capsule, Rfl: 11 .  MOVANTIK 25 MG TABS tablet, Take 25 mg by mouth 2 (two) times a week. , Disp: , Rfl: 11 .  Multiple Vitamins-Minerals (MULTIVITAMIN GUMMIES WOMENS) CHEW, Chew 1 tablet by mouth daily. Flintstone's chewable, Disp: , Rfl:  .  olmesartan (BENICAR) 20 MG tablet, Take 20 mg by mouth daily., Disp: ,  Rfl:  .  oxyCODONE-acetaminophen (PERCOCET) 5-325 MG tablet, Take 1 tablet by mouth every 4 (four) hours as needed for severe pain., Disp: 20 tablet, Rfl: 0 .  pantoprazole (PROTONIX) 40 MG tablet, Take 40 mg by mouth daily., Disp: , Rfl:  .  tiZANidine (ZANAFLEX) 4 MG tablet, TAKE 1 TABLET BY MOUTH FOUR TIMES DAILY AS NEEDED FOR SPASMS, Disp: , Rfl:   Social History   Tobacco Use  Smoking Status Former Smoker  . Packs/day: 0.50  . Years: 43.00  . Pack years: 21.50  . Types: Cigarettes  . Last attempt to quit: 03/26/2018  . Years since quitting: 0.6  Smokeless Tobacco Never Used    Allergies  Allergen Reactions  . Clindamycin/Lincomycin Swelling    Swelling feet  . Codeine Itching  . Levaquin [Levofloxacin] Swelling  . Nsaids     Needs to avoid d/t peptic ulcer   Objective:   There were no vitals filed for this visit. There is no height or weight on file to calculate BMI.  Constitutional Well developed. Well nourished.  Vascular Foot warm and well perfused. Capillary refill normal to all digits.   Neurologic Normal speech. Oriented to person, place, and time. Epicritic sensation to light touch grossly present bilaterally.  Dermatologic Skin well-healed  Orthopedic:  No tenderness about the surgical sites.  Second toe rectus, first metatarsal rectus   Radiographs: Taken and reviewed arthrodesis site healed with good position noted Assessment:   1. 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 Hammer toe of right foot   3. Tailor's bunion of right foot   4. Post-operative state    Plan:  Patient was evaluated and treated and all questions answered.  S/p foot surgery right -Progressing as expected post-operatively. -XR: As above -Patient is very pleased with results of surgery -Continue weightbearing as tolerated in surgical shoe.  We will have patient come back in a few weeks to check on the right foot and potentially plan for surgery on her left foot  Return in about 6 weeks (around 12/15/2018) for XR Bilat, f/u right foot bunion f/u, left foot hammertoe eval .

## 2018-12-14 ENCOUNTER — Encounter: Payer: PRIVATE HEALTH INSURANCE | Admitting: Podiatry

## 2019-01-09 DIAGNOSIS — E782 Mixed hyperlipidemia: Secondary | ICD-10-CM | POA: Diagnosis not present

## 2019-01-09 DIAGNOSIS — G894 Chronic pain syndrome: Secondary | ICD-10-CM | POA: Diagnosis not present

## 2019-01-09 DIAGNOSIS — Z0001 Encounter for general adult medical examination with abnormal findings: Secondary | ICD-10-CM | POA: Diagnosis not present

## 2019-01-09 DIAGNOSIS — Z1389 Encounter for screening for other disorder: Secondary | ICD-10-CM | POA: Diagnosis not present

## 2019-01-09 DIAGNOSIS — E7849 Other hyperlipidemia: Secondary | ICD-10-CM | POA: Diagnosis not present

## 2019-01-09 DIAGNOSIS — I10 Essential (primary) hypertension: Secondary | ICD-10-CM | POA: Diagnosis not present

## 2019-01-25 ENCOUNTER — Ambulatory Visit: Payer: Medicare Other | Admitting: Podiatry

## 2019-01-25 ENCOUNTER — Other Ambulatory Visit: Payer: Self-pay | Admitting: Podiatry

## 2019-01-25 ENCOUNTER — Other Ambulatory Visit: Payer: Self-pay

## 2019-01-25 ENCOUNTER — Ambulatory Visit (INDEPENDENT_AMBULATORY_CARE_PROVIDER_SITE_OTHER): Payer: Medicare Other

## 2019-01-25 VITALS — Temp 97.5°F

## 2019-01-25 DIAGNOSIS — M9689 Other intraoperative and postprocedural complications and disorders of the musculoskeletal system: Secondary | ICD-10-CM

## 2019-01-25 DIAGNOSIS — M79671 Pain in right foot: Secondary | ICD-10-CM | POA: Diagnosis not present

## 2019-01-25 DIAGNOSIS — M2011 Hallux valgus (acquired), right foot: Secondary | ICD-10-CM

## 2019-01-25 DIAGNOSIS — M2042 Other hammer toe(s) (acquired), left foot: Secondary | ICD-10-CM

## 2019-01-25 DIAGNOSIS — M79672 Pain in left foot: Secondary | ICD-10-CM | POA: Diagnosis not present

## 2019-01-25 DIAGNOSIS — M2012 Hallux valgus (acquired), left foot: Secondary | ICD-10-CM

## 2019-01-25 DIAGNOSIS — B351 Tinea unguium: Secondary | ICD-10-CM

## 2019-01-25 DIAGNOSIS — T84498A Other mechanical complication of other internal orthopedic devices, implants and grafts, initial encounter: Secondary | ICD-10-CM

## 2019-01-25 DIAGNOSIS — M21611 Bunion of right foot: Secondary | ICD-10-CM

## 2019-01-26 ENCOUNTER — Telehealth: Payer: Self-pay | Admitting: *Deleted

## 2019-01-26 MED ORDER — FLUCONAZOLE 150 MG PO TABS: ORAL_TABLET | ORAL | 0 refills | Status: DC

## 2019-01-26 NOTE — Telephone Encounter (Signed)
Left message informing pt Dr. March Rummage had sent the order to [facility].

## 2019-01-26 NOTE — Telephone Encounter (Signed)
Pt states she was seen yesterday and was to be prescribed a medication for her toenail. Left message for pt to call with more information concerning the medication Dr. March Rummage was to order.

## 2019-01-30 ENCOUNTER — Telehealth: Payer: Self-pay | Admitting: *Deleted

## 2019-01-30 NOTE — Telephone Encounter (Signed)
I called pt and she states the mediation is for a yeast infection. I told pt yeast could also be a type of fungus in a nail. Pt states understanding.

## 2019-01-30 NOTE — Telephone Encounter (Signed)
Pt called states she was prescribed fluconazole and she wanted to know if that was the medication Dr. March Rummage wanted her to have.

## 2019-02-04 NOTE — Progress Notes (Signed)
Subjective:  Patient ID: [name], female    DOB: Nov 29, 1955,  MRN: [id number]  Chief Complaint  Patient presents with  . Routine Post Op    Pt states right foot is doing better, still some swelling and dorsal pain proximal 1st MT post bunion surgery.  Esmeralda Links Toe    Surgery consult left hammertoe.   DOS: 09/07/2018 Procedure:  1) Lapidus bunionectomy right             2) Removal of Deep Hardware             3) 2nd Hammertoe Revision             4) 2nd MPJ Capsulotomy and Tenotomy             5) 5th metatarsal head excision  63 y.o. female returns for post-op check.  History as above confirmed with patient. Had to cancel her appointments due to the Covid Pandemic - not seen for hte past 3 months. During the interval past 3 months she states she has been doing rather well she has not really been having much pain she is having swelling throughout the day.  When she wakes up she does not have any pain or swelling in the foot but the foot does tend to swell throughout the day.  Review of Systems: Negative except as noted in the HPI. Denies N/V/F/Ch.  Past Medical History:  Diagnosis Date  . Anxiety   . Arthritis   . Cancer ([place]) 1992   cervical  . Chronic back pain   . COPD (chronic obstructive pulmonary disease) ([place])   . Diverticulosis   . Fluid retention   . GERD (gastroesophageal reflux disease)   . High cholesterol   . Hypertension   . Hypokalemia   . Peptic ulcer   . Peripheral neuropathy   . Pneumonia    hospitalized in March 2016   . PONV (postoperative nausea and vomiting)   . Pre-diabetes    before bariatric surgery  . 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  ALPRAZolam (XANAX) 0.25 MG tablet, Take 0.25 mg by mouth at bedtime as needed for anxiety., Disp: , Rfl:  .  amitriptyline (ELAVIL) 25 MG tablet, Take 25 mg by mouth at bedtime. , Disp: , Rfl:  .  amLODipine (NORVASC) 5  MG tablet, Take 5 mg by mouth daily., Disp: , Rfl:  .  atorvastatin (LIPITOR) 40 MG tablet, Take 40 mg by mouth daily., Disp: , Rfl:  .  atorvastatin (LIPITOR) 80 MG tablet, Take 80 mg by mouth daily., Disp: , Rfl:  .  Calcium Carbonate-Vitamin D 600-400 MG-UNIT chew tablet, Chew 1 tablet by mouth 4 (four) times daily. , Disp: , Rfl:  .  cholecalciferol (VITAMIN D3) 25 MCG (1000 UT) tablet, Take 4,000 Units by mouth daily., Disp: , Rfl:  .  Fluocinonide Emulsified Base 0.05 % CREA, APPLY TO THE AFFECTED AREA(S) TWICE DAILY, Disp: , Rfl:  .  fluticasone (FLONASE) 50 MCG/ACT nasal spray, Place 1 spray into the nose 2 (two) times daily as needed (for allergies/throat irritation). , Disp: , Rfl:  .  gabapentin (NEURONTIN) 600 MG tablet, Take 600 mg by mouth 3 (three) times daily., Disp: , Rfl: 1 .  hydrochlorothiazide (HYDRODIURIL) 25 MG tablet, Take 25 mg by mouth daily., Disp: , Rfl:  .  HYDROcodone-acetaminophen (NORCO) 10-325 MG tablet, Take 1 tablet by mouth every 4 (  four) hours. , Disp: , Rfl: 0 .  Linaclotide (LINZESS) 290 MCG CAPS capsule, Take 1 capsule (290 mcg total) by mouth daily. (Patient taking differently: Take 290 mcg by mouth 2 (two) times a week. ), Disp: 30 capsule, Rfl: 11 .  MOVANTIK 25 MG TABS tablet, Take 25 mg by mouth 2 (two) times a week. , Disp: , Rfl: 11 .  Multiple Vitamins-Minerals (MULTIVITAMIN GUMMIES WOMENS) CHEW, Chew 1 tablet by mouth daily. Flintstone's chewable, Disp: , Rfl:  .  olmesartan (BENICAR) 20 MG tablet, Take 20 mg by mouth daily., Disp: , Rfl:  .  oxyCODONE-acetaminophen (PERCOCET) 5-325 MG tablet, Take 1 tablet by mouth every 4 (four) hours as needed for severe pain., Disp: 20 tablet, Rfl: 0 .  pantoprazole (PROTONIX) 40 MG tablet, Take 40 mg by mouth daily., Disp: , Rfl:  .  tiZANidine (ZANAFLEX) 4 MG tablet, TAKE 1 TABLET BY MOUTH FOUR TIMES DAILY AS NEEDED FOR SPASMS, Disp: , Rfl:  .  fluconazole (DIFLUCAN) 150 MG tablet, Take one tablet weekly for 6  weeks., Disp: 6 tablet, Rfl: 0  Social History   Tobacco Use  Smoking Status Former Smoker  . Packs/day: 0.50  . Years: 43.00  . Pack years: 21.50  . Types: Cigarettes  . Quit date: 03/26/2018  . Years since quitting: 0.8  Smokeless Tobacco Never Used    Allergies  Allergen Reactions  . Clindamycin/Lincomycin Swelling    Swelling feet  . Codeine Itching  . Levaquin [Levofloxacin] Swelling  . Nsaids     Needs to avoid d/t peptic ulcer   Objective:   Vitals:   01/25/19 1329  Temp: (!) 97.5 F (36.4 C)   There is no height or weight on file to calculate BMI. Constitutional Well developed. Well nourished.  Vascular Foot warm and well perfused. Capillary refill normal to all digits.   Neurologic Normal speech. Oriented to person, place, and time. Epicritic sensation to light touch grossly present bilaterally.  Dermatologic Skin well-healed Thickening discoloration yellowing nails bilat  Orthopedic:  No pain to palpation about the first tarsometatarsal joint but with edema of the forefoot noted.  First TMT does appear clinically stiff   Radiographs: Taken and reviewed hardware failure of both of the screws.  The tarsometatarsal joint does appear bridged without definite evidence of nonunion.  There appears to be a tract of 1 of the screws with there is likely micromotion occurring Assessment:   1. 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 Onychomycosis   4. Pain in right foot   5. Pain in left foot   6. Failed orthopedic implant, initial encounter ([place])   7. Hallux valgus, left    Plan:  Patient was evaluated and treated and all questions answered.  S/p right Lapidus bunionectomy with apparent hardware failure concerning for possible nonunion -At this point given the lack of follow-up during the last 3 months is unclear whether there is an active nonunion or whether the joint did have a nonunion which led to failure of the  hardware but did go on to subsequent fusion.  Her joint did appear fused at the last visit.  Will order CT to evaluate the joint for fusion. -If non-union present will do infectious nonunion workup/  Left foot bunion -Hold off surgical discussion until we determine course for right foot  Onychomycosis  Return in about 1 month (around 02/24/2019).

## 2019-02-05 ENCOUNTER — Telehealth: Payer: Self-pay | Admitting: *Deleted

## 2019-02-05 DIAGNOSIS — M79671 Pain in right foot: Secondary | ICD-10-CM

## 2019-02-05 DIAGNOSIS — M2011 Hallux valgus (acquired), right foot: Secondary | ICD-10-CM

## 2019-02-05 DIAGNOSIS — M9689 Other intraoperative and postprocedural complications and disorders of the musculoskeletal system: Secondary | ICD-10-CM

## 2019-02-05 DIAGNOSIS — T84498A Other mechanical complication of other internal orthopedic devices, implants and grafts, initial encounter: Secondary | ICD-10-CM

## 2019-02-05 NOTE — Telephone Encounter (Signed)
-----   Message from Evelina Bucy, DPM sent at 02/04/2019  9:42 PM EDT ----- CT Right foot eval non-union 1st TMT with broken hardware

## 2019-02-05 NOTE — Telephone Encounter (Signed)
Orders to J. Quintana, RN for pre-cert, faxed to [facility]. 

## 2019-02-13 ENCOUNTER — Other Ambulatory Visit: Payer: Self-pay

## 2019-02-13 ENCOUNTER — Ambulatory Visit
Admission: RE | Admit: 2019-02-13 | Discharge: 2019-02-13 | Disposition: A | Discharge status: Home / Self Care | Payer: Medicare Other | Source: Ambulatory Visit | Attending: Podiatry | Admitting: Podiatry

## 2019-02-13 DIAGNOSIS — M2011 Hallux valgus (acquired), right foot: Secondary | ICD-10-CM

## 2019-02-13 DIAGNOSIS — M19071 Primary osteoarthritis, right ankle and foot: Secondary | ICD-10-CM | POA: Diagnosis not present

## 2019-02-13 DIAGNOSIS — T84498A Other mechanical complication of other internal orthopedic devices, implants and grafts, initial encounter: Secondary | ICD-10-CM

## 2019-02-13 DIAGNOSIS — M79671 Pain in right foot: Secondary | ICD-10-CM

## 2019-02-13 DIAGNOSIS — M9689 Other intraoperative and postprocedural complications and disorders of the musculoskeletal system: Secondary | ICD-10-CM

## 2019-02-14 DIAGNOSIS — R55 Syncope and collapse: Secondary | ICD-10-CM | POA: Diagnosis not present

## 2019-02-14 DIAGNOSIS — I10 Essential (primary) hypertension: Secondary | ICD-10-CM | POA: Diagnosis not present

## 2019-02-14 DIAGNOSIS — G894 Chronic pain syndrome: Secondary | ICD-10-CM | POA: Diagnosis not present

## 2019-02-14 DIAGNOSIS — M5481 Occipital neuralgia: Secondary | ICD-10-CM | POA: Diagnosis not present

## 2019-03-02 ENCOUNTER — Ambulatory Visit: Payer: Medicare Other | Admitting: Podiatry

## 2019-03-02 ENCOUNTER — Encounter: Payer: Self-pay | Admitting: Podiatry

## 2019-03-02 ENCOUNTER — Other Ambulatory Visit: Payer: Self-pay

## 2019-03-02 VITALS — Temp 97.7°F

## 2019-03-02 DIAGNOSIS — M2011 Hallux valgus (acquired), right foot: Secondary | ICD-10-CM

## 2019-03-02 DIAGNOSIS — M21611 Bunion of right foot: Secondary | ICD-10-CM

## 2019-03-02 DIAGNOSIS — M9689 Other intraoperative and postprocedural complications and disorders of the musculoskeletal system: Secondary | ICD-10-CM

## 2019-03-02 DIAGNOSIS — M79671 Pain in right foot: Secondary | ICD-10-CM

## 2019-03-02 DIAGNOSIS — T84498A Other mechanical complication of other internal orthopedic devices, implants and grafts, initial encounter: Secondary | ICD-10-CM
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
## 2019-03-04 NOTE — Progress Notes (Signed)
Subjective:  Patient ID: [name], female    DOB: 12-07-1955,  MRN: [id number]  Chief Complaint  Patient presents with  . Routine Post Op    DOS 09/07/2018 Lapidus w/bunionectomy HR 2nd Rt   "Its okay, swelling is down and hurts some"  . Suture / Staple Removal    Removed sutures   DOS: 09/07/2018 Procedure:  1) Lapidus bunionectomy right             2) Removal of Deep Hardware             3) 2nd Hammertoe Revision             4) 2nd MPJ Capsulotomy and Tenotomy             5) 5th metatarsal head excision  63 y.o. female returns for post-op check.  History as above confirmed with patient. Review of Systems: Negative except as noted in the HPI. Denies N/V/F/Ch.  Past Medical History:  Diagnosis Date  . Anxiety   . Arthritis   . Cancer ([place]) 1992   cervical  . Chronic back pain   . COPD (chronic obstructive pulmonary disease) ([place])   . Diverticulosis   . Fluid retention   . GERD (gastroesophageal reflux disease)   . High cholesterol   . Hypertension   . Hypokalemia   . Peptic ulcer   . Peripheral neuropathy   . Pneumonia    hospitalized in March 2016   . PONV (postoperative nausea and vomiting)   . Pre-diabetes    before bariatric surgery  . 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  ALPRAZolam (XANAX) 0.25 MG tablet, Take 0.25 mg by mouth at bedtime as needed for anxiety., Disp: , Rfl:  .  amitriptyline (ELAVIL) 25 MG tablet, Take 25 mg by mouth at bedtime. , Disp: , Rfl:  .  amLODipine (NORVASC) 5 MG tablet, Take 5 mg by mouth daily., Disp: , Rfl:  .  atorvastatin (LIPITOR) 40 MG tablet, Take 40 mg by mouth daily., Disp: , Rfl:  .  atorvastatin (LIPITOR) 80 MG tablet, Take 80 mg by mouth daily., Disp: , Rfl:  .  Calcium Carbonate-Vitamin D 600-400 MG-UNIT chew tablet, Chew 1 tablet by mouth 4 (four) times daily. , Disp: , Rfl:  .  cholecalciferol (VITAMIN D3) 25 MCG (1000 UT)  tablet, Take 4,000 Units by mouth daily., Disp: , Rfl:  .  fluconazole (DIFLUCAN) 150 MG tablet, Take one tablet weekly for 6 weeks., Disp: 6 tablet, Rfl: 0 .  Fluocinonide Emulsified Base 0.05 % CREA, APPLY TO THE AFFECTED AREA(S) TWICE DAILY, Disp: , Rfl:  .  fluticasone (FLONASE) 50 MCG/ACT nasal spray, Place 1 spray into the nose 2 (two) times daily as needed (for allergies/throat irritation). , Disp: , Rfl:  .  gabapentin (NEURONTIN) 600 MG tablet, Take 600 mg by mouth 3 (three) times daily., Disp: , Rfl: 1 .  hydrochlorothiazide (HYDRODIURIL) 25 MG tablet, Take 25 mg by mouth daily., Disp: , Rfl:  .  HYDROcodone-acetaminophen (NORCO) 10-325 MG tablet, Take 1 tablet by mouth every 4 (four) hours. , Disp: , Rfl: 0 .  Linaclotide (LINZESS) 290 MCG CAPS capsule, Take 1 capsule (290 mcg total) by mouth daily. (Patient taking differently: Take 290 mcg by mouth 2 (two) times a week. ), Disp: 30 capsule, Rfl: 11 .  MOVANTIK 25 MG TABS tablet, Take 25 mg by mouth 2 (two)  times a week. , Disp: , Rfl: 11 .  Multiple Vitamins-Minerals (MULTIVITAMIN GUMMIES WOMENS) CHEW, Chew 1 tablet by mouth daily. Flintstone's chewable, Disp: , Rfl:  .  olmesartan (BENICAR) 20 MG tablet, Take 20 mg by mouth daily., Disp: , Rfl:  .  oxyCODONE-acetaminophen (PERCOCET) 5-325 MG tablet, Take 1 tablet by mouth every 4 (four) hours as needed for severe pain., Disp: 20 tablet, Rfl: 0 .  pantoprazole (PROTONIX) 40 MG tablet, Take 40 mg by mouth daily., Disp: , Rfl:  .  tiZANidine (ZANAFLEX) 4 MG tablet, TAKE 1 TABLET BY MOUTH FOUR TIMES DAILY AS NEEDED FOR SPASMS, Disp: , Rfl:   Social History   Tobacco Use  Smoking Status Former Smoker  . Packs/day: 0.50  . Years: 43.00  . Pack years: 21.50  . Types: Cigarettes  . Quit date: 03/26/2018  . Years since quitting: 0.9  Smokeless Tobacco Never Used    Allergies  Allergen Reactions  . Clindamycin/Lincomycin Swelling    Swelling feet  . Codeine Itching  . Levaquin  [Levofloxacin] Swelling  . Nsaids     Needs to avoid d/t peptic ulcer   Objective:   There were no vitals filed for this visit. There is no height or weight on file to calculate BMI. Constitutional Well developed. Well nourished.  Vascular Foot warm and well perfused. Capillary refill normal to all digits.   Neurologic Normal speech. Oriented to person, place, and time. Epicritic sensation to light touch grossly present bilaterally.  Dermatologic Skin healing well without signs of infection. Skin edges well coapted without signs of infection.  Orthopedic: Tenderness to palpation noted about the surgical site.  Hallux rectus pin intact second toe straight   Radiographs: None today Assessment:   1. Post-operative state    Plan:  Patient was evaluated and treated and all questions answered.  S/p foot surgery right -Progressing as expected post-operatively. -XR: As above -WB Status: Nonweightbearing in knee scooter -Sutures: Removed today Steri's applied. -Medications: Percocet refilled -Foot redressed.  No follow-ups on file.

## 2019-03-05 ENCOUNTER — Telehealth: Payer: Self-pay | Admitting: *Deleted

## 2019-03-05 NOTE — Telephone Encounter (Signed)
[facility]. Nicole Kindred to pick up paperwork for bone growth stimulator. I will fax LOV 03/02/2019 once available to T. Nicole Kindred.

## 2019-03-05 NOTE — Telephone Encounter (Signed)
-----   Message from Evelina Bucy, DPM sent at 03/02/2019  2:23 PM EDT ----- Can we order bone stim right foot non-union with 61mm gapping CT evidence of non-union

## 2019-03-06 NOTE — Telephone Encounter (Signed)
Faxed 03/02/2019 clinicals to [facility] - T. Nicole Kindred.

## 2019-03-06 NOTE — Progress Notes (Signed)
Subjective:  Patient ID: [name], female    DOB: 1956-01-27,  MRN: [id number]  Chief Complaint  Patient presents with  . Routine Post Op    the anklet does help with the swelling and hurts alot if i am on my foot alot and does get sore and tender at times   DOS: 09/07/2018 Procedure:  1) Lapidus bunionectomy right             2) Removal of Deep Hardware             3) 2nd Hammertoe Revision             4) 2nd MPJ Capsulotomy and Tenotomy             5) 5th metatarsal head excision  63 y.o. female returns for post-op check.  History as above confirmed with patient.  States that the foot only hurts if she is on a lot but she does have soreness and tenderness at times.  Wearing normal flip-flops today.  Review of Systems: Negative except as noted in the HPI. Denies N/V/F/Ch.  Past Medical History:  Diagnosis Date  . Anxiety   . Arthritis   . Cancer ([place]) 1992   cervical  . Chronic back pain   . COPD (chronic obstructive pulmonary disease) ([place])   . Diverticulosis   . Fluid retention   . GERD (gastroesophageal reflux disease)   . High cholesterol   . Hypertension   . Hypokalemia   . Peptic ulcer   . Peripheral neuropathy   . Pneumonia    hospitalized in March 2016   . PONV (postoperative nausea and vomiting)   . Pre-diabetes    before bariatric surgery  . 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  ALPRAZolam (XANAX) 0.25 MG tablet, Take 0.25 mg by mouth at bedtime as needed for anxiety., Disp: , Rfl:  .  amitriptyline (ELAVIL) 25 MG tablet, Take 25 mg by mouth at bedtime. , Disp: , Rfl:  .  amLODipine (NORVASC) 5 MG tablet, Take 5 mg by mouth daily., Disp: , Rfl:  .  atorvastatin (LIPITOR) 40 MG tablet, Take 40 mg by mouth daily., Disp: , Rfl:  .  atorvastatin (LIPITOR) 80 MG tablet, Take 80 mg by mouth daily., Disp: , Rfl:  .  Calcium Carbonate-Vitamin D 600-400 MG-UNIT chew tablet,  Chew 1 tablet by mouth 4 (four) times daily. , Disp: , Rfl:  .  cholecalciferol (VITAMIN D3) 25 MCG (1000 UT) tablet, Take 4,000 Units by mouth daily., Disp: , Rfl:  .  fluconazole (DIFLUCAN) 150 MG tablet, Take one tablet weekly for 6 weeks., Disp: 6 tablet, Rfl: 0 .  Fluocinonide Emulsified Base 0.05 % CREA, APPLY TO THE AFFECTED AREA(S) TWICE DAILY, Disp: , Rfl:  .  fluticasone (FLONASE) 50 MCG/ACT nasal spray, Place 1 spray into the nose 2 (two) times daily as needed (for allergies/throat irritation). , Disp: , Rfl:  .  gabapentin (NEURONTIN) 600 MG tablet, Take 600 mg by mouth 3 (three) times daily., Disp: , Rfl: 1 .  hydrochlorothiazide (HYDRODIURIL) 25 MG tablet, Take 25 mg by mouth daily., Disp: , Rfl:  .  HYDROcodone-acetaminophen (NORCO) 10-325 MG tablet, Take 1 tablet by mouth every 4 (four) hours. , Disp: , Rfl: 0 .  Linaclotide (LINZESS) 290 MCG CAPS capsule, Take 1 capsule (290 mcg total) by mouth daily. (Patient taking differently: Take 290 mcg by mouth  2 (two) times a week. ), Disp: 30 capsule, Rfl: 11 .  MOVANTIK 25 MG TABS tablet, Take 25 mg by mouth 2 (two) times a week. , Disp: , Rfl: 11 .  Multiple Vitamins-Minerals (MULTIVITAMIN GUMMIES WOMENS) CHEW, Chew 1 tablet by mouth daily. Flintstone's chewable, Disp: , Rfl:  .  olmesartan (BENICAR) 20 MG tablet, Take 20 mg by mouth daily., Disp: , Rfl:  .  oxyCODONE-acetaminophen (PERCOCET) 5-325 MG tablet, Take 1 tablet by mouth every 4 (four) hours as needed for severe pain., Disp: 20 tablet, Rfl: 0 .  pantoprazole (PROTONIX) 40 MG tablet, Take 40 mg by mouth daily., Disp: , Rfl:  .  tiZANidine (ZANAFLEX) 4 MG tablet, TAKE 1 TABLET BY MOUTH FOUR TIMES DAILY AS NEEDED FOR SPASMS, Disp: , Rfl:   Social History   Tobacco Use  Smoking Status Former Smoker  . Packs/day: 0.50  . Years: 43.00  . Pack years: 21.50  . Types: Cigarettes  . Quit date: 03/26/2018  . Years since quitting: 0.9  Smokeless Tobacco Never Used    Allergies   Allergen Reactions  . Clindamycin/Lincomycin Swelling    Swelling feet  . Codeine Itching  . Levaquin [Levofloxacin] Swelling  . Nsaids     Needs to avoid d/t peptic ulcer   Objective:   Vitals:   03/02/19 1357  Temp: 97.7 F (36.5 C)   There is no height or weight on file to calculate BMI. Constitutional Well developed. Well nourished.  Vascular Foot warm and well perfused. Capillary refill normal to all digits.   Neurologic Normal speech. Oriented to person, place, and time. Epicritic sensation to light touch grossly present bilaterally.  Dermatologic Skin well-healed Thickening discoloration yellowing nails bilat  Orthopedic:  No pain to palpation about the first tarsometatarsal joint but with edema of the forefoot noted.  First TMT does appear clinically stiff   Radiographs: CT reviewed.  Gapping of nonunion of 2 mm noted Assessment:   1. 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 Failed orthopedic implant, initial encounter ([place])   4. Pain in right foot    Plan:  Patient was evaluated and treated and all questions answered.  S/p right Lapidus bunionectomy with apparent hardware failure concerning for possible nonunion -CT results reviewed with patient.  Discussed evidence of nonunion.  Discussed proceeding with bone stimulator and attempt to resolve the nonunion.  Should the nonunion resolve we may not have to remove the hardware.  She at present does not have any pain.  She does not have any frank symptoms of infectious nonunion however we will closely monitor.  Prior to considering revision or screw removal we will rule out infectious nonunion.  Trial bone stimulator to see if this can help improve the nonunion and reduce edema -Discussed reverting to the boot for immobilization while we are using a bone stimulator.  Patient states that she will do so  Left foot bunion -Hold off surgical discussion until we determine course  for right foot  Onychomycosis -Continue fluconazole.  Return in about 6 weeks (around 04/13/2019) for Bunion f/u right foot .

## 2019-03-20 DIAGNOSIS — C44311 Basal cell carcinoma of skin of nose: Secondary | ICD-10-CM | POA: Diagnosis not present

## 2019-03-23 DIAGNOSIS — M960 Pseudarthrosis after fusion or arthrodesis: Secondary | ICD-10-CM | POA: Diagnosis not present

## 2019-03-27 DIAGNOSIS — G894 Chronic pain syndrome: Secondary | ICD-10-CM | POA: Diagnosis not present

## 2019-03-27 DIAGNOSIS — I10 Essential (primary) hypertension: Secondary | ICD-10-CM | POA: Diagnosis not present

## 2019-03-28 ENCOUNTER — Other Ambulatory Visit (HOSPITAL_COMMUNITY): Payer: Self-pay | Admitting: Internal Medicine

## 2019-03-28 DIAGNOSIS — E2839 Other primary ovarian failure: Secondary | ICD-10-CM

## 2019-04-05 ENCOUNTER — Other Ambulatory Visit: Payer: Self-pay

## 2019-04-05 ENCOUNTER — Ambulatory Visit (HOSPITAL_COMMUNITY)
Admission: RE | Admit: 2019-04-05 | Discharge: 2019-04-05 | Disposition: A | Discharge status: Home / Self Care | Payer: Medicare Other | Source: Ambulatory Visit | Attending: Internal Medicine | Admitting: Internal Medicine

## 2019-04-05 DIAGNOSIS — M85851 Other specified disorders of bone density and structure, right thigh: Secondary | ICD-10-CM | POA: Diagnosis not present

## 2019-04-05 DIAGNOSIS — E2839 Other primary ovarian failure: Secondary | ICD-10-CM | POA: Diagnosis present

## 2019-04-05 DIAGNOSIS — Z780 Asymptomatic menopausal state: Secondary | ICD-10-CM | POA: Diagnosis not present

## 2019-04-13 ENCOUNTER — Ambulatory Visit: Payer: Medicare Other | Admitting: Podiatry

## 2019-04-13 ENCOUNTER — Other Ambulatory Visit: Payer: Self-pay

## 2019-04-13 ENCOUNTER — Ambulatory Visit (INDEPENDENT_AMBULATORY_CARE_PROVIDER_SITE_OTHER): Payer: Medicare Other

## 2019-04-13 ENCOUNTER — Encounter: Payer: Self-pay | Admitting: Podiatry

## 2019-04-13 VITALS — Temp 97.4°F

## 2019-04-13 DIAGNOSIS — T84498A Other mechanical complication of other internal orthopedic devices, implants and grafts, initial encounter: Secondary | ICD-10-CM

## 2019-04-13 DIAGNOSIS — M2011 Hallux valgus (acquired), right foot: Secondary | ICD-10-CM

## 2019-04-13 DIAGNOSIS — M21611 Bunion of right foot: Secondary | ICD-10-CM

## 2019-04-13 DIAGNOSIS — M9689 Other intraoperative and postprocedural complications and disorders of the musculoskeletal system: Secondary | ICD-10-CM | POA: Diagnosis not present

## 2019-04-24 ENCOUNTER — Other Ambulatory Visit: Payer: Self-pay

## 2019-04-24 ENCOUNTER — Encounter: Payer: Self-pay | Admitting: Gastroenterology

## 2019-04-24 ENCOUNTER — Ambulatory Visit: Payer: Medicare Other | Admitting: Gastroenterology

## 2019-04-24 VITALS — BP 157/89 | HR 70 | Temp 95.9°F | Ht 66.0 in | Wt 194.8 lb

## 2019-04-24 DIAGNOSIS — Z9189 Other specified personal risk factors, not elsewhere classified: Secondary | ICD-10-CM | POA: Insufficient documentation

## 2019-04-24 DIAGNOSIS — K219 Gastro-esophageal reflux disease without esophagitis: Secondary | ICD-10-CM | POA: Diagnosis not present

## 2019-04-24 DIAGNOSIS — R1310 Dysphagia, unspecified: Secondary | ICD-10-CM

## 2019-04-24 DIAGNOSIS — R1319 Other dysphagia: Secondary | ICD-10-CM

## 2019-04-24 DIAGNOSIS — Z1159 Encounter for screening for other viral diseases: Secondary | ICD-10-CM | POA: Diagnosis not present

## 2019-04-24 MED ORDER — PANTOPRAZOLE SODIUM 40 MG PO TBEC: 40.0000 mg | DELAYED_RELEASE_TABLET | Freq: Two times a day (BID) | ORAL | 5 refills | Status: DC

## 2019-04-24 NOTE — Patient Instructions (Signed)
1. Increase pantoprazole to 40mg  twice daily before meals. New RX sent to pharmacy.  2. Please go to [facility] for labs. We will contact you with results as available.  3. Upper endoscopy as scheduled. See separate instructions.  4. You next colonoscopy will be due in 05/2023 unless you develops problems in the meantime. New bleeding, change in bowels, unintentional weight loss etc would warrant sooner colonoscopy.

## 2019-04-24 NOTE — Progress Notes (Signed)
Primary Care Physician: Redmond School, MD  Primary Gastroenterologist:  Garfield Cornea, MD   Chief Complaint  Patient presents with  . Gastroesophageal Reflux    Pantoprazole 40 once daily  . Dysphagia    with soreness of throat x 6 months    HPI: [name] is a 63 y.o. female here for further evaluation GERD, dysphagia.  She also has a history of constipation.  She was last seen in February 2017.  She has a history of gastric ulcer.  Last EGD September 2015 for surveillance of gastric ulcer, showed single nonbleeding and shallow ulcer ranging from 3 to 5 mm in size in the gastric antrum, multiple satellite erosions.  Pathology benign.  Felt to be NSAID related.  No plans for follow-up EGD.  Currently up-to-date on colonoscopy, due again in October 2024.  Previous colonoscopy in October 2014 showed colonic diverticulosis, internal hemorrhoids, single colon polyp removed, too small to process.  Gastric sleeve along with hiatal hernia repair in 2017.  Got down to 185 pounds the first year after her surgery.  Weight has been stable around 195 for the past 1 year.  Now having some issues with heartburn, really didn't have that before.  Wakes up in the middle the night with burning and regurgitation.  Takes a while to get everything up.  Sour, bitter mucus.  She will have a sore throat for days after this occurs.  Sleeping reclined.  Occasionally takes second pantoprazole at night for fear of reflux.  Also having problems swallowing.  Feels like food getting stuck in the chest.  Eating small bites.  Has to wash her food down.  Denies abdominal pain.  Bowel movements regular.  No blood in the stool or melena.  Pantoprazole chronically, once every morning.  On occasion takes a second dose at night.  Takes Movantik and/or Linzess only as needed.  Lately bowel movements have been regular.  Nasal surgery for basal cell carcinoma in 02/2019.   Stop regular cigarettes. Continues  e-cigarettes with meals however.   Current Outpatient Medications  Medication Sig Dispense Refill  . amitriptyline (ELAVIL) 25 MG tablet Take 25 mg by mouth at bedtime.     Marland Kitchen amLODipine (NORVASC) 5 MG tablet Take 5 mg by mouth daily.    Marland Kitchen atorvastatin (LIPITOR) 80 MG tablet Take 80 mg by mouth daily.    . Calcium Carbonate-Vitamin D 600-400 MG-UNIT chew tablet Chew 1 tablet by mouth 4 (four) times daily.     . cholecalciferol (VITAMIN D3) 25 MCG (1000 UT) tablet Take 4,000 Units by mouth daily.    . Fluocinonide Emulsified Base 0.05 % CREA APPLY TO THE AFFECTED AREA(S) TWICE DAILY    . fluticasone (FLONASE) 50 MCG/ACT nasal spray Place 1 spray into the nose 2 (two) times daily as needed (for allergies/throat irritation).     . gabapentin (NEURONTIN) 600 MG tablet Take 600 mg by mouth 3 (three) times daily.  1  . hydrochlorothiazide (HYDRODIURIL) 25 MG tablet Take 25 mg by mouth daily.    Marland Kitchen HYDROcodone-acetaminophen (NORCO) 10-325 MG tablet Take 1 tablet by mouth every 4 (four) hours.   0  . Linaclotide (LINZESS) 290 MCG CAPS capsule Take 1 capsule (290 mcg total) by mouth daily. (Patient taking differently: Take 290 mcg by mouth 2 (two) times a week. ) 30 capsule 11  . MOVANTIK 25 MG TABS tablet Take 25 mg by mouth 2 (two) times a week.   11  .  Multiple Vitamins-Minerals (MULTIVITAMIN GUMMIES WOMENS) CHEW Chew 1 tablet by mouth daily. Flintstone's chewable    . olmesartan (BENICAR) 20 MG tablet Take 20 mg by mouth daily.    Marland Kitchen oxyCODONE-acetaminophen (PERCOCET) 5-325 MG tablet Take 1 tablet by mouth every 4 (four) hours as needed for severe pain. 20 tablet 0  . pantoprazole (PROTONIX) 40 MG tablet Take 40 mg by mouth daily.    Marland Kitchen tiZANidine (ZANAFLEX) 4 MG tablet TAKE 1 TABLET BY MOUTH FOUR TIMES DAILY AS NEEDED FOR SPASMS     No current facility-administered medications for this visit.     Allergies as of 04/24/2019 - Review Complete 04/24/2019  Allergen Reaction Noted  .  Clindamycin/lincomycin Swelling 01/29/2013  . Codeine Itching 01/23/2013  . Levaquin [levofloxacin] Swelling 01/29/2013  . Nsaids  06/11/2013    Past Medical History:  Diagnosis Date  . Anxiety   . Arthritis   . Cancer ([place]) 1992   cervical  . Chronic back pain   . COPD (chronic obstructive pulmonary disease) ([place])   . Diverticulosis   . Fluid retention   . GERD (gastroesophageal reflux disease)   . High cholesterol   . Hypertension   . Hypokalemia   . Peptic ulcer   . Peripheral neuropathy   . Pneumonia    hospitalized in March 2016   . PONV (postoperative nausea and vomiting)   . Pre-diabetes    before bariatric surgery  . Superficial basal cell carcinoma (BCC) 11/07/2018   Right Tip of Nose   Past Surgical History:  Procedure Laterality Date  . ABDOMINAL HYSTERECTOMY    . CAPSULOTOMY Right 09/08/2018   Procedure: CAPSULOTOMY 2nd digit;  Surgeon: Evelina Bucy, DPM;  Location: [facility];  Service: Podiatry;  Laterality: Right;  . CARDIAC CATHETERIZATION  2005  . CHOLECYSTECTOMY    . COLONOSCOPY WITH ESOPHAGOGASTRODUODENOSCOPY (EGD) N/A 06/08/2013   [id number] antral ulcer status post biopsy (benign). Hiatal hernia/ Tortuous colon. Internal hemorrhoids. Colonic diverticulosis. Single colonic polyp removed as described above (too small to process). Next TCS 05/2023  . ESOPHAGOGASTRODUODENOSCOPY N/A 05/20/2014   [id number] ulcer ranging between 3-63mm in size was found in the gastric antrum with satellite erosions. Status post ulcer body. Likely NSAID effect.  Marland Kitchen HALLUX VALGUS LAPIDUS Right 09/08/2018   Procedure: HALLUX VALGUS LAPIDUS;  Surgeon: Evelina Bucy, DPM;  Location: [facility];  Service: Podiatry;  Laterality: Right;  . HAMMER TOE SURGERY Right 09/08/2018   Procedure: HAMMER TOE CORRECTION 2nd digit;  Surgeon: Evelina Bucy, DPM;  Location: [facility];  Service: Podiatry;  Laterality: Right;  . INTRAMEDULLARY (IM) NAIL INTERTROCHANTERIC Left 10/05/2013   Procedure:  ORIF LEFT HIP  INTERTROCHANTRIC FRACTURE;  Surgeon: [name], MD;  Location: [facility];  Service: Orthopedics;  Laterality: Left;  . KNEE SURGERY     right and left  . LAPAROSCOPIC GASTRIC SLEEVE RESECTION WITH HIATAL HERNIA REPAIR N/A 07/19/2016   Procedure: LAPAROSCOPIC GASTRIC SLEEVE RESECTION WITH HIATAL HERNIA REPAIR;  Surgeon: Arta Bruce Kinsinger, MD;  Location: [facility];  Service: General;  Laterality: N/A;  . METATARSAL OSTEOTOMY Right 09/08/2018   Procedure: METATARSAL OSTEOTOMY 5th digit;  Surgeon: Evelina Bucy, DPM;  Location: [facility];  Service: Podiatry;  Laterality: Right;  . TOTAL KNEE ARTHROPLASTY Right 01/29/2013   Procedure: TOTAL KNEE ARTHROPLASTY WITH HARDWARE REMOVAL;  Surgeon: [name], MD;  Location: [facility];  Service: Orthopedics;  Laterality: Right;  DEPUY/SIGMA RP, SYNTHES SCREWDRIVERS  . TOTAL KNEE ARTHROPLASTY Left 06/11/2013  Dr Mayer Camel  . TOTAL KNEE ARTHROPLASTY Left 06/11/2013   Procedure: TOTAL KNEE ARTHROPLASTY;  Surgeon: [name], MD;  Location: [facility];  Service: Orthopedics;  Laterality: Left;   Family History  Problem Relation Age of Onset  . Ulcers Father   . Prostate cancer Father   . Kidney cancer Father   . Hypertension Father   . Parkinson's disease Father   . Hypercholesterolemia Father   . Heart disease Father   . Hypotension Mother   . Hypercholesterolemia Mother   . Lung cancer Mother   . Bone cancer Mother   . Neuropathy Neg Hx    Social History   Tobacco Use  . Smoking status: Former Smoker    Packs/day: 0.50    Years: 43.00    Pack years: 21.50    Types: Cigarettes    Quit date: 03/26/2018    Years since quitting: 1.0  . Smokeless tobacco: Never Used  Substance Use Topics  . Alcohol use: No    Alcohol/week: 0.0 standard drinks  . Drug use: No    ROS:  General: Negative for anorexia, weight loss, fever, chills, fatigue, weakness. ENT: Negative for hoarseness,   nasal congestion.see  HPI CV: Negative for chest pain,  angina, palpitations, dyspnea on exertion, peripheral edema.  Respiratory: Negative for dyspnea at rest, dyspnea on exertion, cough, sputum, wheezing.  GI: See history of present illness. GU:  Negative for dysuria, hematuria, urinary incontinence, urinary frequency, nocturnal urination.  Endo: Negative for unusual weight change.    Physical Examination:   BP (!) 157/89   Pulse 70   Temp (!) 95.9 F (35.5 C) (Oral)   Ht 5\' 6"  (1.676 m)   Wt 194 lb 12.8 oz (88.4 kg)   BMI 31.44 kg/m   General: Well-nourished, well-developed in no acute distress.  Eyes: No icterus. Mouth: Oropharyngeal mucosa moist and pink , no lesions erythema or exudate. Lungs: Clear to auscultation bilaterally.  Heart: Regular rate and rhythm, no murmurs rubs or gallops.  Abdomen: Bowel sounds are normal, nontender, nondistended, no hepatosplenomegaly or masses, no abdominal bruits or hernia , no rebound or guarding.   Extremities: No lower extremity edema. No clubbing or deformities. Neuro: Alert and oriented x 4   Skin: Warm and dry, no jaundice.   Psych: Alert and cooperative, normal mood and affect.  Labs:  Lab Results  Component Value Date   CREATININE 0.87 09/06/2018   BUN 21 09/06/2018   NA 140 09/06/2018   K 4.0 09/06/2018   CL 103 09/06/2018   CO2 27 09/06/2018   Lab Results  Component Value Date   ALT 14 04/21/2018   AST 19 04/21/2018   ALKPHOS 73 04/21/2018   BILITOT 0.4 04/21/2018   Lab Results  Component Value Date   WBC 9.3 09/06/2018   HGB 12.6 09/06/2018   HCT 39.9 09/06/2018   MCV 95.9 09/06/2018   PLT 184 09/06/2018    Imaging Studies: Dg Bone Density (dxa)  Result Date: 04/05/2019 EXAM: DUAL X-RAY ABSORPTIOMETRY (DXA) FOR BONE MINERAL DENSITY IMPRESSION: Ordering Physician:  Dr. Redmond School, Your patient [name] completed a BMD test on 04/05/2019 using the [place] (software version: 14.10) manufactured by [employer]. The following  summarizes the results of our evaluation. Technologist: [facility] PATIENT BIOGRAPHICAL: Name: [name], [name] Patient ID: [id number] Birth Date: [birth year] Height: 66.0 in. Gender: Female Exam Date: 04/05/2019 Weight: 195.0 lbs. Indications: Bilateral Oophrectomy, Caucasian, Early Menopause, History of Fracture (Adult),  Post Menopausal Fractures: Femur Treatments: Calcium, Multivitamin, Vitamin D DENSITOMETRY RESULTS: Site         Region     Measured Date Measured Age WHO Classification Young Adult T-score BMD         %Change vs. Previous Significant Change (*) AP Spine L1-L3 (L2) 04/05/2019 65.3 Normal 0.8 1.265 g/cm2 - - Right Femur Neck 04/05/2019 65.3 Osteopenia -2.2 0.737 g/cm2 - - Left Forearm Radius 33% 04/05/2019 65.3 Normal -0.7 0.667 g/cm2 - - ASSESSMENT: The BMD measured at Femur Neck is 0.737 g/cm2 with a T-score of -2.2. This patient is considered osteopenic according to [facility]) criteria. The scan quality is good. L-2 and L-4 were excluded due to advanced degenerative changes. Lt. hip could not be utilized due to surgical hardware. World Pharmacologist [facility]) criteria for post-menopausal, Caucasian Women: Normal:       T-score at or above -1 SD Osteopenia:   T-score between -1 and -2.5 SD Osteoporosis: T-score at or below -2.5 SD RECOMMENDATIONS: 1. All patients should optimize calcium and vitamin D intake. 2. Consider FDA-approved medical therapies in postmenopausal women and med aged 3 years and older, based on the following: a. A hip or vertebral (clinical or morphometric) fracture b. T-score< -2.5 at the femoral neck or spine after appropriate evaluation to exclude secondary causes c. Low bone mass (T-score between -1.0 and -2.5 at the femoral neck or spine) and a 10-year probability of a hip fracture > 3% or a 10-year probability of a major osteoporosis-related fracture > 20% based on the [place]-adapted WHO algorithm d. Clinician judgment and/or patient preferences may indicate  treatment for people with 10-year fracture probabilities above or below these levels FOLLOW-UP: People with diagnosed cases of osteoporosis or at high risk for fracture should have regular bone mineral density tests. For patients eligible for Medicare, routine testing is allowed once every 2 years. The testing frequency can be increased to one year for patients who have rapidly progressing disease, those who are receiving or discontinuing medical therapy to restore bone mass, or have additional risk factors. FRAX* RESULTS:  (version: 3.5) 10-year Probability of Fracture1 Major Osteoporotic Fracture2 Hip Fracture 18.1% 3.1% Population: [place] (Caucasian) Risk Factors: History of Fracture (Adult) Based on Femur (Right) Neck BMD 1 -The 10-year probability of fracture may be lower than reported if the patient has received treatment. 2 -Major Osteoporotic Fracture: Clinical Spine, Forearm, Hip or Shoulder *FRAX is a Materials engineer of the [employer] for Metabolic Bone Disease, a [place] (WHO) [facility]. ASSESSMENT: The probability of a major osteoporotic fracture is 18.1% within the next ten years. The probability of a hip fracture is 3.1% within the next ten years. Electronically Signed   By: Marlaine Hind M.D.   On: 04/05/2019 11:03   Dg Foot Complete Right  Result Date: 04/13/2019 Please see detailed radiograph report in office note.

## 2019-04-24 NOTE — Assessment & Plan Note (Signed)
Patient would like to be screened for hepatitis C.  She has lived with her brother who has a history of hepatitis C but has since been treated.  Her previous husband also had hepatitis C.

## 2019-04-24 NOTE — Assessment & Plan Note (Signed)
63 year old female on chronic pantoprazole, history of gastric sleeve 2017, presenting with refractory reflux, new onset solid food esophageal dysphagia.  Complains of frequent nocturnal symptoms of regurgitation and burning. Trying to follow antireflux measures.  Has cleaned up her diet.  Sleeping reclined in a mechanical bed.  Plan on upper endoscopy with possible esophageal dilation in the near future.  For deep sedation given history of failed conscious sedation and polypharmacy.  I have discussed the risks, alternatives, benefits with regards to but not limited to the risk of reaction to medication, bleeding, infection, perforation and the patient is agreeable to proceed. Written consent to be obtained.  Reinforced antireflux measures. Increase pantoprazole to 40 mg twice daily before meals.

## 2019-04-25 LAB — HEPATITIS C ANTIBODY: Hep C Virus Ab: <0.1 s/co ratio (ref 0.0–0.9)

## 2019-05-11 ENCOUNTER — Ambulatory Visit: Payer: Medicare Other | Admitting: Podiatry

## 2019-05-17 ENCOUNTER — Encounter (HOSPITAL_COMMUNITY): Payer: Self-pay

## 2019-05-20 NOTE — Progress Notes (Signed)
Subjective:  Patient ID: [name], female    DOB: 1955/10/04,  MRN: [id number]  Chief Complaint  Patient presents with  . Routine Post Op    routine post op, pt states that she is doing a lot better since the last time she was here, pt also states that she would like to know if she is using the machine correctly   DOS: 09/07/2018 Procedure:  1) Lapidus bunionectomy right             2) Removal of Deep Hardware             3) 2nd Hammertoe Revision             4) 2nd MPJ Capsulotomy and Tenotomy             5) 5th metatarsal head excision  63 y.o. female returns for post-op check.  History as above.  Using a bone stimulator states that is doing much better  Review of Systems: Negative except as noted in the HPI. Denies N/V/F/Ch.  Past Medical History:  Diagnosis Date  . Anxiety   . Arthritis   . Cancer ([place]) 1992   cervical  . Chronic back pain   . COPD (chronic obstructive pulmonary disease) ([place])   . Diverticulosis   . Fluid retention   . GERD (gastroesophageal reflux disease)   . High cholesterol   . Hypertension   . Hypokalemia   . Peptic ulcer   . Peripheral neuropathy   . Pneumonia    hospitalized in March 2016   . PONV (postoperative nausea and vomiting)   . Pre-diabetes    before bariatric surgery  . 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  amitriptyline (ELAVIL) 25 MG tablet, Take 25 mg by mouth at bedtime. , Disp: , Rfl:  .  amLODipine (NORVASC) 5 MG tablet, Take 5 mg by mouth daily., Disp: , Rfl:  .  atorvastatin (LIPITOR) 80 MG tablet, Take 80 mg by mouth daily., Disp: , Rfl:  .  Calcium Carbonate-Vitamin D 600-400 MG-UNIT chew tablet, Chew 1 tablet by mouth 4 (four) times daily. , Disp: , Rfl:  .  cholecalciferol (VITAMIN D3) 25 MCG (1000 UT) tablet, Take 4,000 Units by mouth daily., Disp: , Rfl:  .  Fluocinonide Emulsified Base 0.05 % CREA, APPLY TO THE AFFECTED  AREA(S) TWICE DAILY, Disp: , Rfl:  .  fluticasone (FLONASE) 50 MCG/ACT nasal spray, Place 1 spray into the nose 2 (two) times daily as needed (for allergies/throat irritation). , Disp: , Rfl:  .  gabapentin (NEURONTIN) 600 MG tablet, Take 600 mg by mouth 3 (three) times daily., Disp: , Rfl: 1 .  hydrochlorothiazide (HYDRODIURIL) 25 MG tablet, Take 25 mg by mouth daily., Disp: , Rfl:  .  HYDROcodone-acetaminophen (NORCO) 10-325 MG tablet, Take 1 tablet by mouth every 4 (four) hours. , Disp: , Rfl: 0 .  Linaclotide (LINZESS) 290 MCG CAPS capsule, Take 1 capsule (290 mcg total) by mouth daily. (Patient taking differently: Take 290 mcg by mouth 2 (two) times a week. ), Disp: 30 capsule, Rfl: 11 .  MOVANTIK 25 MG TABS tablet, Take 25 mg by mouth 2 (two) times a week. , Disp: , Rfl: 11 .  Multiple Vitamins-Minerals (MULTIVITAMIN GUMMIES WOMENS) CHEW, Chew 1 tablet by mouth daily. [employer]stone's chewable, Disp: , Rfl:  .  olmesartan (BENICAR) 20 MG tablet, Take 20 mg by mouth daily., Disp: ,  Rfl:  .  oxyCODONE-acetaminophen (PERCOCET) 5-325 MG tablet, Take 1 tablet by mouth every 4 (four) hours as needed for severe pain., Disp: 20 tablet, Rfl: 0 .  tiZANidine (ZANAFLEX) 4 MG tablet, TAKE 1 TABLET BY MOUTH FOUR TIMES DAILY AS NEEDED FOR SPASMS, Disp: , Rfl:  .  pantoprazole (PROTONIX) 40 MG tablet, Take 1 tablet (40 mg total) by mouth 2 (two) times daily before a meal., Disp: 60 tablet, Rfl: 5  Social History   Tobacco Use  Smoking Status Former Smoker  . Packs/day: 0.50  . Years: 43.00  . Pack years: 21.50  . Types: Cigarettes  . Quit date: 03/26/2018  . Years since quitting: 1.1  Smokeless Tobacco Never Used    Allergies  Allergen Reactions  . Clindamycin/Lincomycin Swelling    Swelling feet  . Codeine Itching  . Levaquin [Levofloxacin] Swelling  . Nsaids     Needs to avoid d/t peptic ulcer   Objective:   Vitals:   04/13/19 1319  Temp: (!) 97.4 F (36.3 C)   There is no height or  weight on file to calculate BMI. Constitutional Well developed. Well nourished.  Vascular Foot warm and well perfused. Capillary refill normal to all digits.   Neurologic Normal speech. Oriented to person, place, and time. Epicritic sensation to light touch grossly present bilaterally.  Dermatologic Skin well-healed Thickening discoloration yellowing nails bilat  Orthopedic:  No pain to palpation about the first tarsometatarsal joint but with edema of the forefoot noted.  First TMT does appear clinically stiff   Radiographs: XR show progressive bridging of the arthrodesis site Assessment:   1. 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 Failed orthopedic implant, initial encounter [facility])    Plan:  Patient was evaluated and treated and all questions answered.  S/p right Lapidus bunionectomy with apparent hardware failure concerning for possible nonunion -XR as above -Progressive degenerative pain decreasing continue use of the wound simulator follow-up in 1 month for recheck  No follow-ups on file.

## 2019-06-05 DIAGNOSIS — Z23 Encounter for immunization: Secondary | ICD-10-CM | POA: Diagnosis not present

## 2019-06-05 DIAGNOSIS — I10 Essential (primary) hypertension: Secondary | ICD-10-CM | POA: Diagnosis not present

## 2019-06-05 DIAGNOSIS — K219 Gastro-esophageal reflux disease without esophagitis: Secondary | ICD-10-CM | POA: Diagnosis not present

## 2019-06-05 DIAGNOSIS — G894 Chronic pain syndrome: Secondary | ICD-10-CM | POA: Diagnosis not present

## 2019-06-21 ENCOUNTER — Ambulatory Visit: Payer: Medicare Other | Admitting: Podiatry

## 2019-06-21 ENCOUNTER — Other Ambulatory Visit: Payer: Self-pay

## 2019-06-21 NOTE — Patient Instructions (Signed)
[name]  06/21/2019     @PREFPERIOPPHARMACY @   Your procedure is scheduled on  06/28/2019 .  Report to [facility] at  1100  A.M.  Call this number if you have problems the morning of surgery:  [phone]   Remember:  Follow the diet instructions given to you by Dr Roseanne Kaufman office.                     Take these medicines the morning of surgery with A SIP OF WATER  Amlodipine, gabapentin, oxycodone(if needed), protonix.    Do not wear jewelry, make-up or nail polish.  Do not wear lotions, powders, or perfumes. Please wear deodorant and brush your teeth.  Do not shave 48 hours prior to surgery.  Men may shave face and neck.  Do not bring valuables to the hospital.  [facility] is not responsible for any belongings or valuables.  Contacts, dentures or bridgework may not be worn into surgery.  Leave your suitcase in the car.  After surgery it may be brought to your room.  For patients admitted to the hospital, discharge time will be determined by your treatment team.  Patients discharged the day of surgery will not be allowed to drive home.   Name and phone number of your driver:   family Special instructions:  None  Please read over the following fact sheets that you were given. Anesthesia Post-op Instructions and Care and Recovery After Surgery       Upper Endoscopy, Adult, Care After This sheet gives you information about how to care for yourself after your procedure. Your health care provider may also give you more specific instructions. If you have problems or questions, contact your health care provider. What can I expect after the procedure? After the procedure, it is common to have:  A sore throat.  Mild stomach pain or discomfort.  Bloating.  Nausea. Follow these instructions at home:   Follow instructions from your health care provider about what to eat or drink after your procedure.  Return to your normal activities as told by your  health care provider. Ask your health care provider what activities are safe for you.  Take over-the-counter and prescription medicines only as told by your health care provider.  Do not drive for 24 hours if you were given a sedative during your procedure.  Keep all follow-up visits as told by your health care provider. This is important. Contact a health care provider if you have:  A sore throat that lasts longer than one day.  Trouble swallowing. Get help right away if:  You vomit blood or your vomit looks like coffee grounds.  You have: ? A fever. ? Bloody, black, or tarry stools. ? A severe sore throat or you cannot swallow. ? Difficulty breathing. ? Severe pain in your chest or abdomen. Summary  After the procedure, it is common to have a sore throat, mild stomach discomfort, bloating, and nausea.  Do not drive for 24 hours if you were given a sedative during the procedure.  Follow instructions from your health care provider about what to eat or drink after your procedure.  Return to your normal activities as told by your health care provider. This information is not intended to replace advice given to you by your health care provider. Make sure you discuss any questions you have with your health care provider. Document Released: 02/08/2012 Document Revised: 01/31/2018  Document Reviewed: 01/09/2018 Elsevier Patient Education  [place].  Esophageal Dilatation Esophageal dilatation, also called esophageal dilation, is a procedure to widen or open (dilate) a blocked or narrowed part of the esophagus. The esophagus is the part of the body that moves food and liquid from the mouth to the stomach. You may need this procedure if:  You have a buildup of scar tissue in your esophagus that makes it difficult, painful, or impossible to swallow. This can be caused by gastroesophageal reflux disease (GERD).  You have cancer of the esophagus.  There is a problem with how  food moves through your esophagus. In some cases, you may need this procedure repeated at a later time to dilate the esophagus gradually. Tell a health care provider about:  Any allergies you have.  All medicines you are taking, including vitamins, herbs, eye drops, creams, and over-the-counter medicines.  Any problems you or family members have had with anesthetic medicines.  Any blood disorders you have.  Any surgeries you have had.  Any medical conditions you have.  Any antibiotic medicines you are required to take before dental procedures.  Whether you are pregnant or may be pregnant. What are the risks? Generally, this is a safe procedure. However, problems may occur, including:  Bleeding due to a tear in the lining of the esophagus.  A hole (perforation) in the esophagus. What happens before the procedure?  Follow instructions from your health care provider about eating or drinking restrictions.  Ask your health care provider about changing or stopping your regular medicines. This is especially important if you are taking diabetes medicines or blood thinners.  Plan to have someone take you home from the hospital or clinic.  Plan to have a responsible adult care for you for at least 24 hours after you leave the hospital or clinic. This is important. What happens during the procedure?  You may be given a medicine to help you relax (sedative).  A numbing medicine may be sprayed into the back of your throat, or you may gargle the medicine.  Your health care provider may perform the dilatation using various surgical instruments, such as: ? Simple dilators. This instrument is carefully placed in the esophagus to stretch it. ? Guided wire bougies. This involves using an endoscope to insert a wire into the esophagus. A dilator is passed over this wire to enlarge the esophagus. Then the wire is removed. ? Balloon dilators. An endoscope with a small balloon at the end is  inserted into the esophagus. The balloon is inflated to stretch the esophagus and open it up. The procedure may vary among health care providers and hospitals. What happens after the procedure?  Your blood pressure, heart rate, breathing rate, and blood oxygen level will be monitored until the medicines you were given have worn off.  Your throat may feel slightly sore and numb. This will improve slowly over time.  You will not be allowed to eat or drink until your throat is no longer numb.  When you are able to drink, urinate, and sit on the edge of the bed without nausea or dizziness, you may be able to return home. Follow these instructions at home:  Take over-the-counter and prescription medicines only as told by your health care provider.  Do not drive for 24 hours if you were given a sedative during your procedure.  You should have a responsible adult with you for 24 hours after the procedure.  Follow instructions from your  health care provider about any eating or drinking restrictions.  Do not use any products that contain nicotine or tobacco, such as cigarettes and e-cigarettes. If you need help quitting, ask your health care provider.  Keep all follow-up visits as told by your health care provider. This is important. Get help right away if you:  Have a fever.  Have chest pain.  Have pain that is not relieved by medication.  Have trouble breathing.  Have trouble swallowing.  Vomit blood. Summary  Esophageal dilatation, also called esophageal dilation, is a procedure to widen or open (dilate) a blocked or narrowed part of the esophagus.  Plan to have someone take you home from the hospital or clinic.  For this procedure, a numbing medicine may be sprayed into the back of your throat, or you may gargle the medicine.  Do not drive for 24 hours if you were given a sedative during your procedure. This information is not intended to replace advice given to you by your  health care provider. Make sure you discuss any questions you have with your health care provider. Document Released: 09/30/2005 Document Revised: 07/22/2017 Document Reviewed: 06/14/2017 Elsevier Patient Education  2020 [place] After These instructions provide you with information about caring for yourself after your procedure. Your health care provider may also give you more specific instructions. Your treatment has been planned according to current medical practices, but problems sometimes occur. Call your health care provider if you have any problems or questions after your procedure. What can I expect after the procedure? After your procedure, you may:  Feel sleepy for several hours.  Feel clumsy and have poor balance for several hours.  Feel forgetful about what happened after the procedure.  Have poor judgment for several hours.  Feel nauseous or vomit.  Have a sore throat if you had a breathing tube during the procedure. Follow these instructions at home: For at least 24 hours after the procedure:      Have a responsible adult stay with you. It is important to have someone help care for you until you are awake and alert.  Rest as needed.  Do not: ? Participate in activities in which you could fall or become injured. ? Drive. ? Use heavy machinery. ? Drink alcohol. ? Take sleeping pills or medicines that cause drowsiness. ? Make important decisions or sign legal documents. ? Take care of children on your own. Eating and drinking  Follow the diet that is recommended by your health care provider.  If you vomit, drink water, juice, or soup when you can drink without vomiting.  Make sure you have little or no nausea before eating solid foods. General instructions  Take over-the-counter and prescription medicines only as told by your health care provider.  If you have sleep apnea, surgery and certain medicines can increase your  risk for breathing problems. Follow instructions from your health care provider about wearing your sleep device: ? Anytime you are sleeping, including during daytime naps. ? While taking prescription pain medicines, sleeping medicines, or medicines that make you drowsy.  If you smoke, do not smoke without supervision.  Keep all follow-up visits as told by your health care provider. This is important. Contact a health care provider if:  You keep feeling nauseous or you keep vomiting.  You feel light-headed.  You develop a rash.  You have a fever. Get help right away if:  You have trouble breathing. Summary  For several hours  after your procedure, you may feel sleepy and have poor judgment.  Have a responsible adult stay with you for at least 24 hours or until you are awake and alert. This information is not intended to replace advice given to you by your health care provider. Make sure you discuss any questions you have with your health care provider. Document Released: 11/30/2015 Document Revised: 11/07/2017 Document Reviewed: 11/30/2015 Elsevier Patient Education  2020 [employer].

## 2019-06-26 ENCOUNTER — Other Ambulatory Visit (HOSPITAL_COMMUNITY)
Admission: RE | Admit: 2019-06-26 | Discharge: 2019-06-26 | Disposition: A | Discharge status: Home / Self Care | Payer: Medicare Other | Source: Ambulatory Visit | Attending: Internal Medicine | Admitting: Internal Medicine

## 2019-06-26 ENCOUNTER — Encounter (HOSPITAL_COMMUNITY)
Admission: RE | Admit: 2019-06-26 | Discharge: 2019-06-26 | Disposition: A | Discharge status: Home / Self Care | Payer: Medicare Other | Source: Ambulatory Visit | Attending: Internal Medicine | Admitting: Internal Medicine

## 2019-06-26 ENCOUNTER — Encounter (HOSPITAL_COMMUNITY): Payer: Self-pay

## 2019-06-26 ENCOUNTER — Other Ambulatory Visit: Payer: Self-pay

## 2019-06-26 DIAGNOSIS — Z01812 Encounter for preprocedural laboratory examination: Secondary | ICD-10-CM | POA: Insufficient documentation

## 2019-06-26 DIAGNOSIS — Z20828 Contact with and (suspected) exposure to other viral communicable diseases: Secondary | ICD-10-CM | POA: Insufficient documentation

## 2019-06-26 LAB — BASIC METABOLIC PANEL
Anion gap: 8 (ref 5–15)
BUN: 15 mg/dL (ref 8–23)
CO2: 27 mmol/L (ref 22–32)
Calcium: 9.6 mg/dL (ref 8.9–10.3)
Chloride: 106 mmol/L (ref 98–111)
Creatinine, Ser: 0.99 mg/dL (ref 0.44–1.00)
GFR calc Af Amer: >60 mL/min (ref >60)
GFR calc non Af Amer: 60 mL/min — ABNORMAL LOW (ref >60)
Glucose, Bld: 119 mg/dL — ABNORMAL HIGH (ref 70–99)
Potassium: 4.1 mmol/L (ref 3.5–5.1)
Sodium: 141 mmol/L (ref 135–145)

## 2019-06-26 LAB — SARS CORONAVIRUS 2 (TAT 6-24 HRS): SARS Coronavirus 2: NEGATIVE

## 2019-06-28 ENCOUNTER — Ambulatory Visit (HOSPITAL_COMMUNITY): Payer: Medicare Other | Admitting: Anesthesiology

## 2019-06-28 ENCOUNTER — Encounter (HOSPITAL_COMMUNITY): Payer: Self-pay | Admitting: *Deleted

## 2019-06-28 ENCOUNTER — Ambulatory Visit (HOSPITAL_COMMUNITY)
Admission: RE | Admit: 2019-06-28 | Discharge: 2019-06-28 | Disposition: A | Discharge status: Home / Self Care | Payer: Medicare Other | Attending: Internal Medicine | Admitting: Internal Medicine

## 2019-06-28 ENCOUNTER — Encounter (HOSPITAL_COMMUNITY)
Admission: RE | Disposition: A | Discharge status: Home / Self Care | Payer: Self-pay | Source: Home / Self Care | Attending: Internal Medicine

## 2019-06-28 ENCOUNTER — Other Ambulatory Visit: Payer: Self-pay

## 2019-06-28 DIAGNOSIS — K449 Diaphragmatic hernia without obstruction or gangrene: Secondary | ICD-10-CM | POA: Insufficient documentation

## 2019-06-28 DIAGNOSIS — Z96653 Presence of artificial knee joint, bilateral: Secondary | ICD-10-CM | POA: Diagnosis not present

## 2019-06-28 DIAGNOSIS — Z885 Allergy status to narcotic agent status: Secondary | ICD-10-CM | POA: Diagnosis not present

## 2019-06-28 DIAGNOSIS — Z8349 Family history of other endocrine, nutritional and metabolic diseases: Secondary | ICD-10-CM | POA: Insufficient documentation

## 2019-06-28 DIAGNOSIS — K279 Peptic ulcer, site unspecified, unspecified as acute or chronic, without hemorrhage or perforation: Secondary | ICD-10-CM | POA: Diagnosis not present

## 2019-06-28 DIAGNOSIS — R1310 Dysphagia, unspecified: Secondary | ICD-10-CM | POA: Diagnosis not present

## 2019-06-28 DIAGNOSIS — I10 Essential (primary) hypertension: Secondary | ICD-10-CM | POA: Diagnosis not present

## 2019-06-28 DIAGNOSIS — K219 Gastro-esophageal reflux disease without esophagitis: Secondary | ICD-10-CM | POA: Diagnosis not present

## 2019-06-28 DIAGNOSIS — R1314 Dysphagia, pharyngoesophageal phase: Secondary | ICD-10-CM | POA: Insufficient documentation

## 2019-06-28 DIAGNOSIS — J449 Chronic obstructive pulmonary disease, unspecified: Secondary | ICD-10-CM | POA: Diagnosis not present

## 2019-06-28 DIAGNOSIS — Z8249 Family history of ischemic heart disease and other diseases of the circulatory system: Secondary | ICD-10-CM | POA: Insufficient documentation

## 2019-06-28 DIAGNOSIS — Z79899 Other long term (current) drug therapy: Secondary | ICD-10-CM | POA: Diagnosis not present

## 2019-06-28 DIAGNOSIS — M1990 Unspecified osteoarthritis, unspecified site: Secondary | ICD-10-CM | POA: Insufficient documentation

## 2019-06-28 DIAGNOSIS — Z87891 Personal history of nicotine dependence: Secondary | ICD-10-CM | POA: Insufficient documentation

## 2019-06-28 DIAGNOSIS — F419 Anxiety disorder, unspecified: Secondary | ICD-10-CM | POA: Insufficient documentation

## 2019-06-28 DIAGNOSIS — I739 Peripheral vascular disease, unspecified: Secondary | ICD-10-CM | POA: Diagnosis not present

## 2019-06-28 DIAGNOSIS — Z791 Long term (current) use of non-steroidal anti-inflammatories (NSAID): Secondary | ICD-10-CM | POA: Diagnosis not present

## 2019-06-28 DIAGNOSIS — Z881 Allergy status to other antibiotic agents status: Secondary | ICD-10-CM | POA: Insufficient documentation

## 2019-06-28 DIAGNOSIS — E7800 Pure hypercholesterolemia, unspecified: Secondary | ICD-10-CM | POA: Diagnosis not present

## 2019-06-28 DIAGNOSIS — R1319 Other dysphagia: Secondary | ICD-10-CM

## 2019-06-28 DIAGNOSIS — Z85828 Personal history of other malignant neoplasm of skin: Secondary | ICD-10-CM | POA: Diagnosis not present

## 2019-06-28 DIAGNOSIS — Z8541 Personal history of malignant neoplasm of cervix uteri: Secondary | ICD-10-CM | POA: Diagnosis not present

## 2019-06-28 HISTORY — PX: ESOPHAGOGASTRODUODENOSCOPY (EGD) WITH PROPOFOL: SHX5813

## 2019-06-28 HISTORY — PX: MALONEY DILATION: SHX5535

## 2019-06-28 SURGERY — ESOPHAGOGASTRODUODENOSCOPY (EGD) WITH PROPOFOL
Anesthesia: General

## 2019-06-28 MED ORDER — PROPOFOL 10 MG/ML IV BOLUS
INTRAVENOUS | Status: DC | PRN
  Administered 2019-06-28 (×2): 40 mg via INTRAVENOUS

## 2019-06-28 MED ORDER — PROPOFOL 500 MG/50ML IV EMUL
INTRAVENOUS | Status: DC | PRN
  Administered 2019-06-28: 200 ug/kg/min via INTRAVENOUS
  Administered 2019-06-28: 100 ug/kg/min via INTRAVENOUS

## 2019-06-28 MED ORDER — LACTATED RINGERS IV SOLN
INTRAVENOUS | Status: DC
  Administered 2019-06-28: 10:00:00 via INTRAVENOUS

## 2019-06-28 MED ORDER — CHLORHEXIDINE GLUCONATE CLOTH 2 % EX PADS: 6.0000 | MEDICATED_PAD | Freq: Once | CUTANEOUS | Status: DC

## 2019-06-28 MED ORDER — PROPOFOL 10 MG/ML IV BOLUS
INTRAVENOUS | Status: AC
  Filled 2019-06-28: qty 60

## 2019-06-28 MED ORDER — MIDAZOLAM HCL 2 MG/2ML IJ SOLN: 0.5000 mg | Freq: Once | INTRAMUSCULAR | Status: DC | PRN

## 2019-06-28 MED ORDER — PROMETHAZINE HCL 25 MG/ML IJ SOLN: 6.2500 mg | INTRAMUSCULAR | Status: DC | PRN

## 2019-06-28 NOTE — Anesthesia Procedure Notes (Signed)
Date/Time: 06/28/2019 11:20 AM Performed by: Vista Deck, CRNA Pre-anesthesia Checklist: Patient identified, Emergency Drugs available, Suction available, Timeout performed and Patient being monitored Patient Re-evaluated:Patient Re-evaluated prior to induction Oxygen Delivery Method: Nasal Cannula

## 2019-06-28 NOTE — Op Note (Signed)
[facility] Patient Name: [name] Procedure Date: 06/28/2019 10:59 AM MRN: [id number] Date of Birth: [birth year] Attending MD: Norvel Richards , MD CSN: [id number] Age: 63 Admit Type: Outpatient Procedure:                Upper GI endoscopy Indications:              Dysphagia; refractory GERD Providers:                Norvel Richards, MD, Janeece Riggers, RN, Aram Candela Referring MD:              Medicines:                Propofol per Anesthesia Complications:            No immediate complications. Estimated Blood Loss:     Estimated blood loss: none. Procedure:                Pre-Anesthesia Assessment:                           - Prior to the procedure, a History and Physical                            was performed, and patient medications and                            allergies were reviewed. The patient's tolerance of                            previous anesthesia was also reviewed. The risks                            and benefits of the procedure and the sedation                            options and risks were discussed with the patient.                            All questions were answered, and informed consent                            was obtained. Prior Anticoagulants: The patient has                            taken no previous anticoagulant or antiplatelet                            agents. ASA Grade Assessment: III - A patient with                            severe systemic disease. After reviewing the risks  and benefits, the patient was deemed in                            satisfactory condition to undergo the procedure.                           After obtaining informed consent, the endoscope was                            passed under direct vision. Throughout the                            procedure, the patient's blood pressure, pulse, and                            oxygen saturations were  monitored continuously. The                            [id number]-[id number]) scope was introduced through the                            mouth, and advanced to the second part of duodenum.                            The upper GI endoscopy was accomplished without                            difficulty. The patient tolerated the procedure                            well. Scope In: 11:25:57 AM Scope Out: 11:32:55 AM Total Procedure Duration: 0 hours 6 minutes 58 seconds  Findings:      A small hiatal hernia was present.      The examined esophagus was normal. The scope was withdrawn. Dilation was       performed with a Maloney dilator with mild resistance at 14 Fr. The       scope was withdrawn. Dilation was performed with a Maloney dilator with       mild resistance at 56 Fr. The dilation site was examined following       endoscope reinsertion and showed no change. Estimated blood loss: none.      The duodenal bulb and second portion of the duodenum were normal. Impression:               - Small hiatal hernia.                           - Normal esophagus. Dilated.                           - Normal duodenal bulb and second portion of the                            duodenum.                           -  No specimens collected. Moderate Sedation:      Moderate (conscious) sedation was administered by the endoscopy nurse       and supervised by the endoscopist. The following parameters were       monitored: oxygen saturation, heart rate, blood pressure, respiratory       rate, EKG, adequacy of pulmonary ventilation, and response to care. Recommendation:           - Patient has a contact number available for                            emergencies. The signs and symptoms of potential                            delayed complications were discussed with the                            patient. Return to normal activities tomorrow.                            Written discharge instructions were  provided to the                            patient.                           - Resume previous diet.                           - Continue present medications. However, stop                            Protonix; begin Dexilant 60 mg daily                           - Return to my office in 3 months. Procedure Code(s):        --- Professional ---                           [phone], Esophagogastroduodenoscopy, flexible,                            transoral; diagnostic, including collection of                            specimen(s) by brushing or washing, when performed                            (separate procedure)                           43450, Dilation of esophagus, by unguided sound or                            bougie, single or multiple passes Diagnosis Code(s):        --- Professional ---  K44.9, Diaphragmatic hernia without obstruction or                            gangrene                           R13.10, Dysphagia, unspecified CPT copyright 2019 American Medical Association. All rights reserved. The codes documented in this report are preliminary and upon coder review may  be revised to meet current compliance requirements. Cristopher Estimable. Celestina Gironda, MD Norvel Richards, MD 06/28/2019 11:43:26 AM This report has been signed electronically. Number of Addenda: 0

## 2019-06-28 NOTE — H&P (Signed)
@LOGO @   Primary Care Physician:  Redmond School, MD Primary Gastroenterologist:  Dr. Gala Romney  Pre-Procedure History & Physical: HPI:  [name] is a 63 y.o. female here for further evaluation of GERD and now esophageal dysphagia..  Persisting reflux despite of taking Protonix 40 mg twice daily.  Past Medical History:  Diagnosis Date  . Anxiety   . Arthritis   . Cancer ([place]) 1992   cervical  . Chronic back pain   . COPD (chronic obstructive pulmonary disease) ([place])   . Diverticulosis   . Fluid retention   . GERD (gastroesophageal reflux disease)   . High cholesterol   . Hypertension   . Hypokalemia   . Peptic ulcer   . Peripheral neuropathy   . Pneumonia    hospitalized in March 2016   . PONV (postoperative nausea and vomiting)   . Pre-diabetes    before bariatric surgery  . Superficial basal cell carcinoma (BCC) 11/07/2018   Right Tip of Nose    Past Surgical History:  Procedure Laterality Date  . ABDOMINAL HYSTERECTOMY    . CAPSULOTOMY Right 09/08/2018   Procedure: CAPSULOTOMY 2nd digit;  Surgeon: Evelina Bucy, DPM;  Location: [facility];  Service: Podiatry;  Laterality: Right;  . CARDIAC CATHETERIZATION  2005  . CHOLECYSTECTOMY    . COLONOSCOPY WITH ESOPHAGOGASTRODUODENOSCOPY (EGD) N/A 06/08/2013   [id number] antral ulcer status post biopsy (benign). Hiatal hernia/ Tortuous colon. Internal hemorrhoids. Colonic diverticulosis. Single colonic polyp removed as described above (too small to process). Next TCS 05/2023  . ESOPHAGOGASTRODUODENOSCOPY N/A 05/20/2014   [id number] ulcer ranging between 3-79mm in size was found in the gastric antrum with satellite erosions. Status post ulcer body. Likely NSAID effect.  Marland Kitchen HALLUX VALGUS LAPIDUS Right 09/08/2018   Procedure: HALLUX VALGUS LAPIDUS;  Surgeon: Evelina Bucy, DPM;  Location: [place];  Service: Podiatry;  Laterality: Right;  . HAMMER TOE SURGERY Right 09/08/2018   Procedure: HAMMER TOE CORRECTION 2nd digit;   Surgeon: Evelina Bucy, DPM;  Location: [facility];  Service: Podiatry;  Laterality: Right;  . INTRAMEDULLARY (IM) NAIL INTERTROCHANTERIC Left 10/05/2013   Procedure: ORIF LEFT HIP  INTERTROCHANTRIC FRACTURE;  Surgeon: [name], MD;  Location: [facility];  Service: Orthopedics;  Laterality: Left;  . KNEE SURGERY     right and left  . LAPAROSCOPIC GASTRIC SLEEVE RESECTION WITH HIATAL HERNIA REPAIR N/A 07/19/2016   Procedure: LAPAROSCOPIC GASTRIC SLEEVE RESECTION WITH HIATAL HERNIA REPAIR;  Surgeon: Arta Bruce Kinsinger, MD;  Location: [facility];  Service: General;  Laterality: N/A;  . METATARSAL OSTEOTOMY Right 09/08/2018   Procedure: METATARSAL OSTEOTOMY 5th digit;  Surgeon: Evelina Bucy, DPM;  Location: [facility];  Service: Podiatry;  Laterality: Right;  . TOTAL KNEE ARTHROPLASTY Right 01/29/2013   Procedure: TOTAL KNEE ARTHROPLASTY WITH HARDWARE REMOVAL;  Surgeon: [name], MD;  Location: [place] [facility] [place];  Service: Orthopedics;  Laterality: Right;  DEPUY/SIGMA RP, SYNTHES SCREWDRIVERS  . TOTAL KNEE ARTHROPLASTY Left 06/11/2013   Dr Mayer Camel  . TOTAL KNEE ARTHROPLASTY Left 06/11/2013   Procedure: TOTAL KNEE ARTHROPLASTY;  Surgeon: [name], MD;  Location: [facility];  Service: Orthopedics;  Laterality: Left;    Prior to Admission medications   Medication Sig Start Date End Date Taking? Authorizing Provider  amitriptyline (ELAVIL) 25 MG tablet Take 25 mg by mouth at bedtime as needed for sleep.  01/08/15  Yes [provider]  amLODipine (NORVASC) 5 MG tablet Take 5 mg by mouth daily.   Yes  [provider]  atorvastatin (LIPITOR) 80 MG tablet Take 80 mg by mouth daily. 01/19/19  Yes [provider]  Calcium Carbonate-Vitamin D 600-400 MG-UNIT chew tablet Chew 1 tablet by mouth daily.    Yes [provider]  diphenhydrAMINE (BENADRYL ALLERGY) 25 mg capsule Take 50 mg by mouth daily.   Yes [provider]  Fluocinonide Emulsified Base 0.05 % CREA Apply 1 application  topically 2 (two) times daily as needed (eczema).  08/22/18  Yes [provider]  fluticasone (FLONASE) 50 MCG/ACT nasal spray Place 2 sprays into both nostrils daily as needed for allergies.    Yes [provider]  gabapentin (NEURONTIN) 600 MG tablet Take 600 mg by mouth 3 (three) times daily. 05/04/18  Yes [provider]  hydrochlorothiazide (HYDRODIURIL) 25 MG tablet Take 25 mg by mouth daily.   Yes [provider]  Linaclotide (LINZESS) 290 MCG CAPS capsule Take 1 capsule (290 mcg total) by mouth daily. Patient taking differently: Take 290 mcg by mouth daily as needed (constipation).  10/16/15  Yes Mahala Menghini, PA-C  MOVANTIK 25 MG TABS tablet Take 25 mg by mouth daily as needed (constipation).  06/10/18  Yes [provider]  Multiple Vitamins-Minerals (MULTIVITAMIN GUMMIES WOMENS) CHEW Chew 1 tablet by mouth daily.    Yes [provider]  olmesartan (BENICAR) 20 MG tablet Take 20 mg by mouth daily.   Yes [provider]  oxyCODONE-acetaminophen (PERCOCET) 10-325 MG tablet Take 1 tablet by mouth every 4 (four) hours as needed for moderate pain. 06/12/19  Yes [provider]  pantoprazole (PROTONIX) 40 MG tablet Take 1 tablet (40 mg total) by mouth 2 (two) times daily before a meal. 04/24/19  Yes Mahala Menghini, PA-C  tiZANidine (ZANAFLEX) 4 MG tablet Take 4 mg by mouth at bedtime.  09/29/18  Yes [provider]  oxyCODONE-acetaminophen (PERCOCET) 5-325 MG tablet Take 1 tablet by mouth every 4 (four) hours as needed for severe pain. Patient not taking: Reported on 06/19/2019 10/12/18   Evelina Bucy, DPM    Allergies as of 04/24/2019 - Review Complete 04/24/2019  Allergen Reaction Noted  . Clindamycin/lincomycin Swelling 01/29/2013  . Codeine Itching 01/23/2013  . Levaquin [levofloxacin] Swelling 01/29/2013  . Nsaids  06/11/2013    Family History  Problem Relation Age of Onset  . Ulcers Father   .  Prostate cancer Father   . Kidney cancer Father   . Hypertension Father   . Parkinson's disease Father   . Hypercholesterolemia Father   . Heart disease Father   . Hypotension Mother   . Hypercholesterolemia Mother   . Lung cancer Mother   . Bone cancer Mother   . Neuropathy Neg Hx   . Colon cancer Neg Hx     Social History   Socioeconomic History  . Marital status: Widowed    Spouse name: Not on file  . Number of children: 3  . Years of education: 17  . Highest education level: Not on file  Occupational History  . Occupation: Home health aid  Social Needs  . Financial resource strain: Not on file  . Food insecurity    Worry: Not on file    Inability: Not on file  . Transportation needs    Medical: Not on file    Non-medical: Not on file  Tobacco Use  . Smoking status: Former Smoker    Packs/day: 0.50    Years: 43.00    Pack years: 21.50  Types: Cigarettes    Quit date: 03/26/2018    Years since quitting: 1.2  . Smokeless tobacco: Never Used  Substance and Sexual Activity  . Alcohol use: No    Alcohol/week: 0.0 standard drinks  . Drug use: No  . Sexual activity: Yes  Lifestyle  . Physical activity    Days per week: Not on file    Minutes per session: Not on file  . Stress: Not on file  Relationships  . Social Herbalist on phone: Not on file    Gets together: Not on file    Attends religious service: Not on file    Active member of club or organization: Not on file    Attends meetings of clubs or organizations: Not on file    Relationship status: Not on file  . Intimate partner violence    Fear of current or ex partner: Not on file    Emotionally abused: Not on file    Physically abused: Not on file    Forced sexual activity: Not on file  Other Topics Concern  . Not on file  Social History Narrative   Lives at home with her brother.   Right-handed.   1-2 cups caffeine daily.    Review of Systems: See HPI, otherwise negative  ROS  Physical Exam: BP (!) 160/85   Pulse 63   Temp 98.3 F (36.8 C) (Oral)   Ht 5\' 6"  (1.676 m)   Wt 86.6 kg   SpO2 97%   BMI 30.81 kg/m  General:   Alert,  Well-developed, well-nourished, pleasant and cooperative in NAD Neck:  Supple; no masses or thyromegaly. No significant cervical adenopathy. Lungs:  Clear throughout to auscultation.   No wheezes, crackles, or rhonchi. No acute distress. Heart:  Regular rate and rhythm; no murmurs, clicks, rubs,  or gallops. Abdomen: Non-distended, normal bowel sounds.  Soft and nontender without appreciable mass or hepatosplenomegaly.  Pulses:  Normal pulses noted. Extremities:  Without clubbing or edema.  Impression/Plan: 63 year old lady with a longstanding GERD poorly controlled now esophageal dysphagia.  Solid food dysphagia. EGD now being done with possible esophageal dilation as feasible/appropriate  per plan. The risks, benefits, limitations, alternatives and imponderables have been reviewed with the patient. Potential for esophageal dilation, biopsy, etc. have also been reviewed.  Questions have been answered. All parties agreeable.    Notice: This dictation was prepared with Dragon dictation along with smaller phrase technology. Any transcriptional errors that result from this process are unintentional and may not be corrected upon review.

## 2019-06-28 NOTE — Transfer of Care (Signed)
Immediate Anesthesia Transfer of Care Note  Patient: [name]  Procedure(s) Performed: ESOPHAGOGASTRODUODENOSCOPY (EGD) WITH PROPOFOL (N/A ) MALONEY DILATION (N/A )  Patient Location: PACU  Anesthesia Type:General  Level of Consciousness: awake, alert  and patient cooperative  Airway & Oxygen Therapy: Patient Spontanous Breathing  Post-op Assessment: Report given to RN and Post -op Vital signs reviewed and stable  Post vital signs: Reviewed and stable  Last Vitals:  Vitals Value Taken Time  BP 131/75   Temp 98   Pulse 66   Resp 18   SpO2 97 06/28/19 1140  Vitals shown include unvalidated device data.  Last Pain:  Vitals:   06/28/19 0930  TempSrc: Oral  PainSc: 0-No pain         Complications: No apparent anesthesia complications

## 2019-06-28 NOTE — Discharge Instructions (Signed)
EGD Discharge instructions Please read the instructions outlined below and refer to this sheet in the next few weeks. These discharge instructions provide you with general information on caring for yourself after you leave the hospital. Your doctor may also give you specific instructions. While your treatment has been planned according to the most current medical practices available, unavoidable complications occasionally occur. If you have any problems or questions after discharge, please call your doctor. ACTIVITY  You may resume your regular activity but move at a slower pace for the next 24 hours.   Take frequent rest periods for the next 24 hours.   Walking will help expel (get rid of) the air and reduce the bloated feeling in your abdomen.   No driving for 24 hours (because of the anesthesia (medicine) used during the test).   You may shower.   Do not sign any important legal documents or operate any machinery for 24 hours (because of the anesthesia used during the test).  NUTRITION  Drink plenty of fluids.   You may resume your normal diet.   Begin with a light meal and progress to your normal diet.   Avoid alcoholic beverages for 24 hours or as instructed by your caregiver.  MEDICATIONS  You may resume your normal medications unless your caregiver tells you otherwise.  WHAT YOU CAN EXPECT TODAY  You may experience abdominal discomfort such as a feeling of fullness or gas pains.  FOLLOW-UP  Your doctor will discuss the results of your test with you.  SEEK IMMEDIATE MEDICAL ATTENTION IF ANY OF THE FOLLOWING OCCUR:  Excessive nausea (feeling sick to your stomach) and/or vomiting.   Severe abdominal pain and distention (swelling).   Trouble swallowing.   Temperature over 101 F (37.8 C).   Rectal bleeding or vomiting of blood.   GERD information provided  Stop Protonix  Begin Dexilant 60 mg daily  Your medication list has conflicts.  Please review all your  medications with prescribing physician prior to resuming.  Office visit with [place] in 3 months  At patient request, I spoke to her brother, Vonna Drafts, at [phone] with the impression and recommendations    Gastroesophageal Reflux Disease, Adult Gastroesophageal reflux (GER) happens when acid from the stomach flows up into the tube that connects the mouth and the stomach (esophagus). Normally, food travels down the esophagus and stays in the stomach to be digested. With GER, food and stomach acid sometimes move back up into the esophagus. You may have a disease called gastroesophageal reflux disease (GERD) if the reflux:  Happens often.  Causes frequent or very bad symptoms.  Causes problems such as damage to the esophagus. When this happens, the esophagus becomes sore and swollen (inflamed). Over time, GERD can make small holes (ulcers) in the lining of the esophagus. What are the causes? This condition is caused by a problem with the muscle between the esophagus and the stomach. When this muscle is weak or not normal, it does not close properly to keep food and acid from coming back up from the stomach. The muscle can be weak because of:  Tobacco use.  Pregnancy.  Having a certain type of hernia (hiatal hernia).  Alcohol use.  Certain foods and drinks, such as coffee, chocolate, onions, and peppermint. What increases the risk? You are more likely to develop this condition if you:  Are overweight.  Have a disease that affects your connective tissue.  Use NSAID medicines. What are the signs or symptoms? Symptoms of  this condition include:  Heartburn.  Difficult or painful swallowing.  The feeling of having a lump in the throat.  A bitter taste in the mouth.  Bad breath.  Having a lot of saliva.  Having an upset or bloated stomach.  Belching.  Chest pain. Different conditions can cause chest pain. Make sure you see your doctor if you have chest  pain.  Shortness of breath or noisy breathing (wheezing).  Ongoing (chronic) cough or a cough at night.  Wearing away of the surface of teeth (tooth enamel).  Weight loss. How is this treated? Treatment will depend on how bad your symptoms are. Your doctor may suggest:  Changes to your diet.  Medicine.  Surgery. Follow these instructions at home: Eating and drinking   Follow a diet as told by your doctor. You may need to avoid foods and drinks such as: ? Coffee and tea (with or without caffeine). ? Drinks that contain alcohol. ? Energy drinks and sports drinks. ? Bubbly (carbonated) drinks or sodas. ? Chocolate and cocoa. ? Peppermint and mint flavorings. ? Garlic and onions. ? Horseradish. ? Spicy and acidic foods. These include peppers, chili powder, curry powder, vinegar, hot sauces, and BBQ sauce. ? Citrus fruit juices and citrus fruits, such as oranges, lemons, and limes. ? Tomato-based foods. These include red sauce, chili, salsa, and pizza with red sauce. ? Fried and fatty foods. These include donuts, french fries, potato chips, and high-fat dressings. ? High-fat meats. These include hot dogs, rib eye steak, sausage, ham, and bacon. ? High-fat dairy items, such as whole milk, butter, and cream cheese.  Eat small meals often. Avoid eating large meals.  Avoid drinking large amounts of liquid with your meals.  Avoid eating meals during the 2-3 hours before bedtime.  Avoid lying down right after you eat.  Do not exercise right after you eat. Lifestyle   Do not use any products that contain nicotine or tobacco. These include cigarettes, e-cigarettes, and chewing tobacco. If you need help quitting, ask your doctor.  Try to lower your stress. If you need help doing this, ask your doctor.  If you are overweight, lose an amount of weight that is healthy for you. Ask your doctor about a safe weight loss goal. General instructions  Pay attention to any changes in  your symptoms.  Take over-the-counter and prescription medicines only as told by your doctor. Do not take aspirin, ibuprofen, or other NSAIDs unless your doctor says it is okay.  Wear loose clothes. Do not wear anything tight around your waist.  Raise (elevate) the head of your bed about 6 inches (15 cm).  Avoid bending over if this makes your symptoms worse.  Keep all follow-up visits as told by your doctor. This is important. Contact a doctor if:  You have new symptoms.  You lose weight and you do not know why.  You have trouble swallowing or it hurts to swallow.  You have wheezing or a cough that keeps happening.  Your symptoms do not get better with treatment.  You have a hoarse voice. Get help right away if:  You have pain in your arms, neck, jaw, teeth, or back.  You feel sweaty, dizzy, or light-headed.  You have chest pain or shortness of breath.  You throw up (vomit) and your throw-up looks like blood or coffee grounds.  You pass out (faint).  Your poop (stool) is bloody or black.  You cannot swallow, drink, or eat. Summary  If a  person has gastroesophageal reflux disease (GERD), food and stomach acid move back up into the esophagus and cause symptoms or problems such as damage to the esophagus.  Treatment will depend on how bad your symptoms are.  Follow a diet as told by your doctor.  Take all medicines only as told by your doctor. This information is not intended to replace advice given to you by your health care provider. Make sure you discuss any questions you have with your health care provider. Document Released: 01/26/2008 Document Revised: 02/15/2018 Document Reviewed: 02/15/2018 Elsevier Patient Education  2020 [employer] After These instructions provide you with information about caring for yourself after your procedure. Your health care provider may also give you more specific instructions. Your  treatment has been planned according to current medical practices, but problems sometimes occur. Call your health care provider if you have any problems or questions after your procedure. What can I expect after the procedure? After your procedure, you may:  Feel sleepy for several hours.  Feel clumsy and have poor balance for several hours.  Feel forgetful about what happened after the procedure.  Have poor judgment for several hours.  Feel nauseous or vomit.  Have a sore throat if you had a breathing tube during the procedure. Follow these instructions at home: For at least 24 hours after the procedure:      Have a responsible adult stay with you. It is important to have someone help care for you until you are awake and alert.  Rest as needed.  Do not: ? Participate in activities in which you could fall or become injured. ? Drive. ? Use heavy machinery. ? Drink alcohol. ? Take sleeping pills or medicines that cause drowsiness. ? Make important decisions or sign legal documents. ? Take care of children on your own. Eating and drinking  Follow the diet that is recommended by your health care provider.  If you vomit, drink water, juice, or soup when you can drink without vomiting.  Make sure you have little or no nausea before eating solid foods. General instructions  Take over-the-counter and prescription medicines only as told by your health care provider.  If you have sleep apnea, surgery and certain medicines can increase your risk for breathing problems. Follow instructions from your health care provider about wearing your sleep device: ? Anytime you are sleeping, including during daytime naps. ? While taking prescription pain medicines, sleeping medicines, or medicines that make you drowsy.  If you smoke, do not smoke without supervision.  Keep all follow-up visits as told by your health care provider. This is important. Contact a health care provider  if:  You keep feeling nauseous or you keep vomiting.  You feel light-headed.  You develop a rash.  You have a fever. Get help right away if:  You have trouble breathing. Summary  For several hours after your procedure, you may feel sleepy and have poor judgment.  Have a responsible adult stay with you for at least 24 hours or until you are awake and alert. This information is not intended to replace advice given to you by your health care provider. Make sure you discuss any questions you have with your health care provider. Document Released: 11/30/2015 Document Revised: 11/07/2017 Document Reviewed: 11/30/2015 Elsevier Patient Education  2020 [employer].

## 2019-06-28 NOTE — Anesthesia Postprocedure Evaluation (Signed)
Anesthesia Post Note  Patient: [name]  Procedure(s) Performed: ESOPHAGOGASTRODUODENOSCOPY (EGD) WITH PROPOFOL (N/A ) MALONEY DILATION (N/A )  Patient location during evaluation: PACU Anesthesia Type: General Level of consciousness: awake and alert and patient cooperative Pain management: satisfactory to patient Vital Signs Assessment: post-procedure vital signs reviewed and stable Respiratory status: spontaneous breathing Cardiovascular status: stable Postop Assessment: no apparent nausea or vomiting Anesthetic complications: no     Last Vitals:  Vitals:   06/28/19 0930 06/28/19 1140  BP: (!) 160/85 131/75  Pulse: 63   Resp:  13  Temp: 36.8 C 36.7 C  SpO2: 97% 98%    Last Pain:  Vitals:   06/28/19 1140  TempSrc:   PainSc: 0-No pain                 Lashawna Poche

## 2019-06-28 NOTE — Anesthesia Preprocedure Evaluation (Signed)
Anesthesia Evaluation  Patient identified by MRN, date of birth, ID band Patient awake    Reviewed: Allergy & Precautions, NPO status , Patient's Chart, lab work & pertinent test results  History of Anesthesia Complications (+) PONV and history of anesthetic complications  Airway Mallampati: II  TM Distance: >3 FB Neck ROM: Full    Dental no notable dental hx. (+) Edentulous Lower, Edentulous Upper   Pulmonary pneumonia, resolved, neg COPD, former smoker,  Denies COPD when questioned     Pulmonary exam normal breath sounds clear to auscultation       Cardiovascular hypertension, Pt. on medications and Pt. on home beta blockers (-) anginanegative cardio ROS Normal cardiovascular examI Rhythm:Regular Rate:Normal  Active  S/p Gastric sleeve with ~100lbs weight loss Goes to gym 3x/week  dewnies CP/MI/DOE Denies Angina when questioned   Neuro/Psych Anxiety  Neuromuscular disease negative psych ROS   GI/Hepatic Neg liver ROS, PUD, GERD  Medicated and Controlled,  Endo/Other  negative endocrine ROS  Renal/GU negative Renal ROS  negative genitourinary   Musculoskeletal  (+) Arthritis , Osteoarthritis,    Abdominal   Peds negative pediatric ROS (+)  Hematology negative hematology ROS (+)   Anesthesia Other Findings   Reproductive/Obstetrics negative OB ROS                             Anesthesia Physical Anesthesia Plan  ASA: III  Anesthesia Plan: General   Post-op Pain Management:    Induction: Intravenous  PONV Risk Score and Plan: 4 or greater and Dexamethasone, Ondansetron, Propofol infusion, TIVA and Treatment may vary due to age or medical condition  Airway Management Planned: Nasal Cannula and Simple Face Mask  Additional Equipment:   Intra-op Plan:   Post-operative Plan:   Informed Consent: I have reviewed the patients History and Physical, chart, labs and discussed the  procedure including the risks, benefits and alternatives for the proposed anesthesia with the patient or authorized representative who has indicated his/her understanding and acceptance.     Dental advisory given  Plan Discussed with: CRNA  Anesthesia Plan Comments: (Plan Full PPE use  Plan GA with GETA as needed d/w pt -WTP with same after Q&A)        Anesthesia Quick Evaluation

## 2019-06-29 ENCOUNTER — Telehealth: Payer: Self-pay | Admitting: Internal Medicine

## 2019-06-29 ENCOUNTER — Other Ambulatory Visit: Payer: Self-pay

## 2019-06-29 MED ORDER — PANTOPRAZOLE SODIUM 40 MG PO TBEC: 40.0000 mg | DELAYED_RELEASE_TABLET | Freq: Two times a day (BID) | ORAL | 11 refills | Status: DC

## 2019-06-29 NOTE — Telephone Encounter (Signed)
Noted. Pt is aware that medication was called into her pharmacy. Pt will call our office back if the medication doesn't work well for her.

## 2019-06-29 NOTE — Telephone Encounter (Signed)
Reason I changed her PPI was due to pantoprazole not working adequately.  For now, we can just go with pantoprazole 40 mg twice daily (before breakfast and before supper).  Just take every day.  Dispense 60 (1) twice daily.  11 refills.

## 2019-06-29 NOTE — Telephone Encounter (Signed)
[phone]  PATIENT CALLED AND SAID DEXILANT WAS TOO EXPENSIVE AND WOULD LIKE TO TRY SOMETHING ELSE, PATIENT USES [facility]

## 2019-06-29 NOTE — Telephone Encounter (Signed)
Spoke with pt. Pt had her procedure EGD yesterday 06/28/2019 and was asked to d/c Pantoprazole 40 mg and start Dexilant 60 mg. Pt went to pick up her Dexilant RX and it's going to be $140 a month. Pt would like to know if it's ok to try the Pantoprazole 40 mg back to see if it helps since she had her procedure? Please advise.

## 2019-07-06 ENCOUNTER — Ambulatory Visit (INDEPENDENT_AMBULATORY_CARE_PROVIDER_SITE_OTHER): Payer: Medicare Other

## 2019-07-06 ENCOUNTER — Other Ambulatory Visit: Payer: Self-pay | Admitting: Podiatry

## 2019-07-06 ENCOUNTER — Ambulatory Visit (INDEPENDENT_AMBULATORY_CARE_PROVIDER_SITE_OTHER): Payer: Medicare Other | Admitting: Podiatry

## 2019-07-06 ENCOUNTER — Other Ambulatory Visit: Payer: Self-pay

## 2019-07-06 DIAGNOSIS — B351 Tinea unguium: Secondary | ICD-10-CM

## 2019-07-06 DIAGNOSIS — M79671 Pain in right foot: Secondary | ICD-10-CM

## 2019-07-06 DIAGNOSIS — M2011 Hallux valgus (acquired), right foot: Secondary | ICD-10-CM

## 2019-07-06 DIAGNOSIS — M21611 Bunion of right foot: Secondary | ICD-10-CM

## 2019-07-06 DIAGNOSIS — M9689 Other intraoperative and postprocedural complications and disorders of the musculoskeletal system: Secondary | ICD-10-CM | POA: Diagnosis not present

## 2019-07-06 MED ORDER — FLUCONAZOLE 150 MG PO TABS: 150.0000 mg | ORAL_TABLET | ORAL | 2 refills | Status: DC

## 2019-07-10 ENCOUNTER — Encounter (HOSPITAL_COMMUNITY): Payer: Self-pay | Admitting: Internal Medicine

## 2019-08-06 DIAGNOSIS — K219 Gastro-esophageal reflux disease without esophagitis: Secondary | ICD-10-CM | POA: Diagnosis not present

## 2019-08-06 DIAGNOSIS — G894 Chronic pain syndrome: Secondary | ICD-10-CM | POA: Diagnosis not present

## 2019-08-23 ENCOUNTER — Encounter: Payer: Self-pay | Admitting: Podiatry

## 2019-08-23 ENCOUNTER — Ambulatory Visit: Payer: Medicare Other | Admitting: Podiatry

## 2019-08-23 ENCOUNTER — Other Ambulatory Visit: Payer: Self-pay

## 2019-08-23 DIAGNOSIS — M2011 Hallux valgus (acquired), right foot: Secondary | ICD-10-CM

## 2019-08-23 DIAGNOSIS — M9689 Other intraoperative and postprocedural complications and disorders of the musculoskeletal system: Secondary | ICD-10-CM

## 2019-08-23 DIAGNOSIS — M21611 Bunion of right foot: Secondary | ICD-10-CM

## 2019-08-23 DIAGNOSIS — E7849 Other hyperlipidemia: Secondary | ICD-10-CM | POA: Diagnosis not present

## 2019-08-23 DIAGNOSIS — I10 Essential (primary) hypertension: Secondary | ICD-10-CM | POA: Diagnosis not present

## 2019-08-23 DIAGNOSIS — B351 Tinea unguium: Secondary | ICD-10-CM

## 2019-08-23 DIAGNOSIS — G894 Chronic pain syndrome: Secondary | ICD-10-CM | POA: Diagnosis not present

## 2019-08-23 MED ORDER — FLUCONAZOLE 150 MG PO TABS: 150.0000 mg | ORAL_TABLET | ORAL | 2 refills | Status: DC

## 2019-08-23 NOTE — Progress Notes (Signed)
  Subjective:  Patient ID: [name], female    DOB: Jul 09, 1956,  MRN: [id number]  Chief Complaint  Patient presents with  . Foot Pain    pt is here for a f/u of hallux valgus of the right foot, pt states that the right foot is doing alot better since the last time she was here, pt also states that the right foot will often ache from time to time, pt also states that her nail fungus is improving as well    63 y.o. female presents with the above complaint. Hx confirmed with patient. Only having occasional right foot pain. Nails improving. Objective:  Physical Exam: warm, good capillary refill, normal DP and PT pulses, and no pain to palpation right 1st TMT; dystrophic nail changes bilateral hallux.  Radiographs:  None today Assessment:  1. 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 Onychomycosis     Plan:  Patient was evaluated and treated and all questions answered.  Hlallux valgus right s/p correction with nonunion -Improving,  continue bone stimulator.  - XR next visit   Onychoymcosis -Nails debrided x2 -Refill Fluconazole  Return in about 6 weeks (around 10/04/2019) for bunion, Right, with XRs, Nail Fungus.

## 2019-09-05 ENCOUNTER — Encounter: Payer: Self-pay | Admitting: Cardiovascular Disease

## 2019-09-05 ENCOUNTER — Telehealth (INDEPENDENT_AMBULATORY_CARE_PROVIDER_SITE_OTHER): Payer: Medicare Other | Admitting: Cardiovascular Disease

## 2019-09-05 VITALS — BP 117/73 | HR 67 | Ht 66.0 in | Wt 186.0 lb

## 2019-09-05 DIAGNOSIS — E785 Hyperlipidemia, unspecified: Secondary | ICD-10-CM | POA: Diagnosis not present

## 2019-09-05 DIAGNOSIS — I10 Essential (primary) hypertension: Secondary | ICD-10-CM

## 2019-09-05 DIAGNOSIS — Z8249 Family history of ischemic heart disease and other diseases of the circulatory system: Secondary | ICD-10-CM | POA: Diagnosis not present

## 2019-09-05 NOTE — Progress Notes (Signed)
Virtual Visit via Telephone Note   This visit type was conducted due to national recommendations for restrictions regarding the COVID-19 Pandemic (e.g. social distancing) in an effort to limit this patient's exposure and mitigate transmission in our community.  Due to her co-morbid illnesses, this patient is at least at moderate risk for complications without adequate follow up.  This format is felt to be most appropriate for this patient at this time.  The patient did not have access to video technology/had technical difficulties with video requiring transitioning to audio format only (telephone).  All issues noted in this document were discussed and addressed.  No physical exam could be performed with this format.  Please refer to the patient's chart for her  consent to telehealth for [facility].   Date:  09/05/2019   ID:  [name], DOB 08-12-1956, MRN [id number]  Patient Location: Home Provider Location: Office  PCP:  Redmond School, MD  Cardiologist:  Kate Sable, MD  Electrophysiologist:  None   Evaluation Performed:  Follow-Up Visit  Chief Complaint: Family history of heart disease  History of Present Illness:    [name] is a 64 y.o. female with a family history of heart disease at the request of Redmond School, MD.   Past medical history includes hypertension, tobacco abuse, and hyperlipidemia.  She takes care of her brother who was in a car accident in 8. He's had multiple orthopedic surgeries.  She also takes care of a 66 yr old woman.  The patient denies any symptoms of chest pain, palpitations, shortness of breath, lightheadedness, dizziness, leg swelling, orthopnea, PND, and syncope.  She's had some shoulder issues.  She quit smoking.   Family history: Mother underwent 2 percutaneous coronary interventions at the age of 56.  Her father underwent three-vessel CABG at the age of 1.  Both parents are deceased.  Her mother died of cancer  and her father died of Alzheimer's disease.  Social history: She has been widowed since 2006.  She is originally from the [place], [place] area.  Her son-in-law is the mayor of [place], [place].  She is an [place] and enjoys cooking homemade [place] food from [employer] account [employer].  She works in home health care. She takes care of her brother who was in a car accident in 3. He's had multiple orthopedic surgeries.   Past Medical History:  Diagnosis Date  . Anxiety   . Arthritis   . Cancer ([place]) 1992   cervical  . Chronic back pain   . COPD (chronic obstructive pulmonary disease) ([place])   . Diverticulosis   . Fluid retention   . GERD (gastroesophageal reflux disease)   . High cholesterol   . Hypertension   . Hypokalemia   . Peptic ulcer   . Peripheral neuropathy   . Pneumonia    hospitalized in March 2016   . PONV (postoperative nausea and vomiting)   . Pre-diabetes    before bariatric surgery  . Superficial basal cell carcinoma (BCC) 11/07/2018   Right Tip of Nose   Past Surgical History:  Procedure Laterality Date  . ABDOMINAL HYSTERECTOMY    . CAPSULOTOMY Right 09/08/2018   Procedure: CAPSULOTOMY 2nd digit;  Surgeon: Evelina Bucy, DPM;  Location: [place];  Service: Podiatry;  Laterality: Right;  . CARDIAC CATHETERIZATION  2005  . CHOLECYSTECTOMY    . COLONOSCOPY WITH ESOPHAGOGASTRODUODENOSCOPY (EGD) N/A 06/08/2013   [id number] antral ulcer status post biopsy (benign). Hiatal hernia/ Tortuous colon. Internal hemorrhoids. Colonic diverticulosis.  Single colonic polyp removed as described above (too small to process). Next TCS 05/2023  . ESOPHAGOGASTRODUODENOSCOPY N/A 05/20/2014   [id number] ulcer ranging between 3-22mm in size was found in the gastric antrum with satellite erosions. Status post ulcer body. Likely NSAID effect.  . ESOPHAGOGASTRODUODENOSCOPY (EGD) WITH PROPOFOL N/A 06/28/2019   Procedure: ESOPHAGOGASTRODUODENOSCOPY (EGD) WITH PROPOFOL;  Surgeon: Daneil Dolin, MD;  [facility];  Service: Endoscopy;  Laterality: N/A;  12:30pm  . HALLUX VALGUS LAPIDUS Right 09/08/2018   Procedure: HALLUX VALGUS LAPIDUS;  Surgeon: Evelina Bucy, DPM;  Location: [place];  Service: Podiatry;  Laterality: Right;  . HAMMER TOE SURGERY Right 09/08/2018   Procedure: HAMMER TOE CORRECTION 2nd digit;  Surgeon: Evelina Bucy, DPM;  Location: [place];  Service: Podiatry;  Laterality: Right;  . INTRAMEDULLARY (IM) NAIL INTERTROCHANTERIC Left 10/05/2013   Procedure: ORIF LEFT HIP  INTERTROCHANTRIC FRACTURE;  Surgeon: [name], MD;  Location: [place];  Service: Orthopedics;  Laterality: Left;  . KNEE SURGERY     right and left  . LAPAROSCOPIC GASTRIC SLEEVE RESECTION WITH HIATAL HERNIA REPAIR N/A 07/19/2016   Procedure: LAPAROSCOPIC GASTRIC SLEEVE RESECTION WITH HIATAL HERNIA REPAIR;  Surgeon: Mickeal Skinner, MD;  [facility];  Service: General;  Laterality: N/A;  . Venia Minks DILATION N/A 06/28/2019   Procedure: Keturah Shavers;  Surgeon: Daneil Dolin, MD;  [facility];  Service: Endoscopy;  Laterality: N/A;  . METATARSAL OSTEOTOMY Right 09/08/2018   Procedure: METATARSAL OSTEOTOMY 5th digit;  Surgeon: Evelina Bucy, DPM;  Location: [place];  Service: Podiatry;  Laterality: Right;  . TOTAL KNEE ARTHROPLASTY Right 01/29/2013   Procedure: TOTAL KNEE ARTHROPLASTY WITH HARDWARE REMOVAL;  Surgeon: [name], MD;  Location: [place];  Service: Orthopedics;  Laterality: Right;  DEPUY/SIGMA RP, SYNTHES SCREWDRIVERS  . TOTAL KNEE ARTHROPLASTY Left 06/11/2013   Dr Mayer Camel  . TOTAL KNEE ARTHROPLASTY Left 06/11/2013   Procedure: TOTAL KNEE ARTHROPLASTY;  Surgeon: [name], MD;  Location: [place];  Service: Orthopedics;  Laterality: Left;     Current Meds  Medication Sig  . amitriptyline (ELAVIL) 25 MG tablet Take 25 mg by mouth at bedtime as needed for sleep.   Marland Kitchen amLODipine (NORVASC) 5 MG tablet Take 5 mg by mouth daily.  Marland Kitchen atorvastatin (LIPITOR) 80  MG tablet Take 80 mg by mouth daily.  . Calcium Carbonate-Vitamin D 600-400 MG-UNIT chew tablet Chew 1 tablet by mouth daily.   . diphenhydrAMINE (BENADRYL ALLERGY) 25 mg capsule Take 50 mg by mouth daily.  . fluconazole (DIFLUCAN) 150 MG tablet Take 1 tablet (150 mg total) by mouth once a week.  . Fluocinonide Emulsified Base 0.05 % CREA Apply 1 application topically 2 (two) times daily as needed (eczema).   . fluticasone (FLONASE) 50 MCG/ACT nasal spray Place 2 sprays into both nostrils daily as needed for allergies.   Marland Kitchen gabapentin (NEURONTIN) 600 MG tablet Take 600 mg by mouth 3 (three) times daily.  . hydrochlorothiazide (HYDRODIURIL) 25 MG tablet Take 25 mg by mouth daily.  . Linaclotide (LINZESS) 290 MCG CAPS capsule Take 1 capsule (290 mcg total) by mouth daily. (Patient taking differently: Take 290 mcg by mouth daily as needed (constipation). )  . MOVANTIK 25 MG TABS tablet Take 25 mg by mouth daily as needed (constipation).   . Multiple Vitamins-Minerals (MULTIVITAMIN GUMMIES WOMENS) CHEW Chew 1 tablet by mouth daily.   Marland Kitchen olmesartan (BENICAR) 20 MG tablet Take 20 mg  by mouth daily.  Marland Kitchen oxyCODONE-acetaminophen (PERCOCET) 10-325 MG tablet Take 1 tablet by mouth every 4 (four) hours as needed for moderate pain.  . pantoprazole (PROTONIX) 40 MG tablet Take 1 tablet (40 mg total) by mouth 2 (two) times daily. (Patient taking differently: Take 80 mg by mouth 2 (two) times daily. )  . tiZANidine (ZANAFLEX) 4 MG tablet Take 4 mg by mouth at bedtime.      Allergies:   Clindamycin/lincomycin, Codeine, Levaquin [levofloxacin], and Nsaids   Social History   Tobacco Use  . Smoking status: Former Smoker    Packs/day: 0.50    Years: 43.00    Pack years: 21.50    Types: Cigarettes    Quit date: 03/26/2018    Years since quitting: 1.4  . Smokeless tobacco: Never Used  Substance Use Topics  . Alcohol use: No    Alcohol/week: 0.0 standard drinks  . Drug use: No     Family Hx: The patient's  family history includes Bone cancer in her mother; Heart disease in her father; Hypercholesterolemia in her father and mother; Hypertension in her father; Hypotension in her mother; Kidney cancer in her father; Lung cancer in her mother; Parkinson's disease in her father; Prostate cancer in her father; Ulcers in her father. There is no history of Neuropathy or Colon cancer.  ROS:   Please see the history of present illness.     All other systems reviewed and are negative.   Prior CV studies:   The following studies were reviewed today:  NA  Labs/Other Tests and Data Reviewed:    EKG:  No ECG reviewed.  Recent Labs: 09/06/2018: Hemoglobin 12.6; Platelets 184 06/26/2019: BUN 15; Creatinine, Ser 0.99; Potassium 4.1; Sodium 141   Recent Lipid Panel No results found for: CHOL, TRIG, HDL, CHOLHDL, LDLCALC, LDLDIRECT  Wt Readings from Last 3 Encounters:  09/05/19 186 lb (84.4 kg)  06/28/19 190 lb 14.7 oz (86.6 kg)  06/26/19 191 lb (86.6 kg)     Objective:    Vital Signs:  BP 117/73   Pulse 67   Ht 5\' 6"  (1.676 m)   Wt 186 lb (84.4 kg)   BMI 30.02 kg/m    VITAL SIGNS:  reviewed  ASSESSMENT & PLAN:    1.  Family history of premature coronary artery disease: The patient has several cardiovascular risk factors including hypertension, hyperlipidemia, and tobacco abuse.  She quit smoking.  She is on atorvastatin 80 mg daily. Blood pressure is controlled.  At this time, no cardiac testing is indicated.  2.  Hypertension: Blood pressure is controlled on present therapy. No changes to therapy.  3.  Hyperlipidemia: Continue atorvastatin 80 mg.    COVID-19 Education: The signs and symptoms of COVID-19 were discussed with the patient and how to seek care for testing (follow up with PCP or arrange E-visit).  The importance of social distancing was discussed today.  Time:   Today, I have spent 10 minutes with the patient with telehealth technology discussing the above problems.      Medication Adjustments/Labs and Tests Ordered: Current medicines are reviewed at length with the patient today.  Concerns regarding medicines are outlined above.   Tests Ordered: No orders of the defined types were placed in this encounter.   Medication Changes: No orders of the defined types were placed in this encounter.   Follow Up:  Virtual Visit  in 1 year(s)  Signed, Kate Sable, MD  09/05/2019 1:29 PM    [facility]

## 2019-09-05 NOTE — Patient Instructions (Addendum)
Medication Instructions:   Your physician recommends that you continue on your current medications as directed. Please refer to the Current Medication list given to you today.  Labwork:  NONE  Testing/Procedures:  NONE  Follow-Up:  Your physician recommends that you schedule a follow-up appointment in: 1 year (virtual). You will receive a reminder letter in the mail in about 10 months reminding you to call and schedule your appointment. If you don't receive this letter, please contact our office.  Any Other Special Instructions Will Be Listed Below (If Applicable).  If you need a refill on your cardiac medications before your next appointment, please call your pharmacy. 

## 2019-09-10 ENCOUNTER — Other Ambulatory Visit (HOSPITAL_COMMUNITY): Payer: Self-pay | Admitting: Internal Medicine

## 2019-09-10 DIAGNOSIS — Z1231 Encounter for screening mammogram for malignant neoplasm of breast: Secondary | ICD-10-CM

## 2019-09-14 ENCOUNTER — Other Ambulatory Visit: Payer: Self-pay

## 2019-09-14 ENCOUNTER — Ambulatory Visit (HOSPITAL_COMMUNITY)
Admission: RE | Admit: 2019-09-14 | Discharge: 2019-09-14 | Disposition: A | Discharge status: Home / Self Care | Payer: Medicare Other | Source: Ambulatory Visit | Attending: Internal Medicine | Admitting: Internal Medicine

## 2019-09-14 DIAGNOSIS — Z0000 Encounter for general adult medical examination without abnormal findings: Secondary | ICD-10-CM | POA: Diagnosis not present

## 2019-09-14 DIAGNOSIS — K5909 Other constipation: Secondary | ICD-10-CM | POA: Diagnosis not present

## 2019-09-14 DIAGNOSIS — M47812 Spondylosis without myelopathy or radiculopathy, cervical region: Secondary | ICD-10-CM | POA: Diagnosis not present

## 2019-09-14 DIAGNOSIS — J449 Chronic obstructive pulmonary disease, unspecified: Secondary | ICD-10-CM | POA: Diagnosis not present

## 2019-09-14 DIAGNOSIS — Z1389 Encounter for screening for other disorder: Secondary | ICD-10-CM | POA: Diagnosis not present

## 2019-09-14 DIAGNOSIS — G8929 Other chronic pain: Secondary | ICD-10-CM | POA: Diagnosis not present

## 2019-09-14 DIAGNOSIS — I10 Essential (primary) hypertension: Secondary | ICD-10-CM | POA: Diagnosis not present

## 2019-09-14 DIAGNOSIS — Z1231 Encounter for screening mammogram for malignant neoplasm of breast: Secondary | ICD-10-CM | POA: Diagnosis not present

## 2019-09-14 DIAGNOSIS — E039 Hypothyroidism, unspecified: Secondary | ICD-10-CM | POA: Diagnosis not present

## 2019-10-04 ENCOUNTER — Other Ambulatory Visit: Payer: Self-pay | Admitting: Podiatry

## 2019-10-04 ENCOUNTER — Encounter: Payer: Self-pay | Admitting: Gastroenterology

## 2019-10-04 ENCOUNTER — Other Ambulatory Visit: Payer: Self-pay

## 2019-10-04 ENCOUNTER — Ambulatory Visit (INDEPENDENT_AMBULATORY_CARE_PROVIDER_SITE_OTHER): Payer: Medicare Other

## 2019-10-04 ENCOUNTER — Ambulatory Visit: Payer: Medicare Other | Admitting: Podiatry

## 2019-10-04 VITALS — Temp 96.7°F

## 2019-10-04 DIAGNOSIS — B351 Tinea unguium: Secondary | ICD-10-CM | POA: Diagnosis not present

## 2019-10-04 DIAGNOSIS — M9689 Other intraoperative and postprocedural complications and disorders of the musculoskeletal system: Secondary | ICD-10-CM | POA: Diagnosis not present

## 2019-10-04 DIAGNOSIS — M2011 Hallux valgus (acquired), right foot: Secondary | ICD-10-CM

## 2019-10-04 DIAGNOSIS — M21611 Bunion of right foot: Secondary | ICD-10-CM

## 2019-10-04 DIAGNOSIS — T84498D Other mechanical complication of other internal orthopedic devices, implants and grafts, subsequent encounter: Secondary | ICD-10-CM

## 2019-10-04 MED ORDER — FLUCONAZOLE 150 MG PO TABS: 150.0000 mg | ORAL_TABLET | ORAL | 2 refills | Status: AC

## 2019-10-04 NOTE — Progress Notes (Signed)
Primary Care Physician:  Redmond School, MD Primary GI:  Garfield Cornea, MD   Patient Location: Home  Provider Location: [facility] office  Reason for Visit:  Chief Complaint  Patient presents with  . Follow-up    fu from EGD,heartburn    Persons present on the virtual encounter, with roles: Patient, myself (provider),Angela Stallings CMA(updated meds and allergies)  Total time (minutes) spent on medical discussion: 15 mintes  Due to COVID-19, visit was conducted using [email or web address] method.  Visit was requested by patient.  Virtual Visit via [email or web address]  I connected with [name] on 10/05/19 at  8:00 AM EST by [email or web address] and verified that I am speaking with the correct person using two identifiers.   I discussed the limitations, risks, security and privacy concerns of performing an evaluation and management service by telephone/video and the availability of in person appointments. I also discussed with the patient that there may be a patient responsible charge related to this service. The patient expressed understanding and agreed to proceed.   HPI:   [name] is a 64 y.o. female who presents for virtual visit regarding GERD, dysphagia, constipation.    H/o gastric sleeve along with hiatal hernia repair in 2017. H/o remote PUD. Colonoscopy up to date with next one due 05/2023.  EGD 06/2019: normal esophagus s/p dilation. Normal stomach/duodenum.  Doing well.  Swallowing improved.  Her heartburn is reasonably well controlled now on pantoprazole 40 mg twice daily.  She ran out of her medication 1 day and tried her brothers omeprazole 20 mg and she believes it works better and would like a prescription.  Denies any abdominal pain.  Still struggles with constipation.  May have 1 or 2 bowel movements per week.  Typically on the weekends she will take Movantik and Linzess. Does get abdominal cramping with this.  No melena or rectal bleeding.     Current Outpatient Medications    Medication Sig Dispense Refill  . amitriptyline (ELAVIL) 25 MG tablet Take 25 mg by mouth at bedtime as needed for sleep.     Marland Kitchen amLODipine (NORVASC) 5 MG tablet Take 5 mg by mouth daily.    Marland Kitchen atorvastatin (LIPITOR) 80 MG tablet Take 80 mg by mouth daily.    . Calcium Carbonate-Vitamin D 600-400 MG-UNIT chew tablet Chew 1 tablet by mouth daily.     . diphenhydrAMINE (BENADRYL ALLERGY) 25 mg capsule Take 50 mg by mouth daily.    . fluconazole (DIFLUCAN) 150 MG tablet Take 1 tablet (150 mg total) by mouth once a week. 4 tablet 2  . Fluocinonide Emulsified Base 0.05 % CREA Apply 1 application topically 2 (two) times daily as needed (eczema).     . fluticasone (FLONASE) 50 MCG/ACT nasal spray Place 2 sprays into both nostrils daily as needed for allergies.     Marland Kitchen gabapentin (NEURONTIN) 600 MG tablet Take 600 mg by mouth 3 (three) times daily.  1  . hydrochlorothiazide (HYDRODIURIL) 25 MG tablet Take 25 mg by mouth daily.    . Linaclotide (LINZESS) 290 MCG CAPS capsule Take 1 capsule (290 mcg total) by mouth daily. (Patient taking differently: Take 290 mcg by mouth daily as needed (constipation). ) 30 capsule 11  . MOVANTIK 25 MG TABS tablet Take 25 mg by mouth daily as needed (constipation).   11  . olmesartan (BENICAR) 20 MG tablet Take 20 mg by mouth daily.    Marland Kitchen oxyCODONE-acetaminophen (PERCOCET) 10-325 MG tablet Take 1 tablet  by mouth every 4 (four) hours as needed for moderate pain.    . pantoprazole (PROTONIX) 40 MG tablet Take 1 tablet (40 mg total) by mouth 2 (two) times daily. 60 tablet 11  . Pediatric Multiple Vitamins (FLINTSTONES MULTIVITAMIN PO) Take by mouth daily.    Marland Kitchen tiZANidine (ZANAFLEX) 4 MG tablet Take 4 mg by mouth at bedtime.      No current facility-administered medications for this visit.    Past Medical History:  Diagnosis Date  . Anxiety   . Arthritis   . Cancer ([place]) 1992   cervical  . Chronic back pain   . COPD (chronic obstructive pulmonary disease) ([place])   .  Diverticulosis   . Fluid retention   . GERD (gastroesophageal reflux disease)   . High cholesterol   . Hypertension   . Hypokalemia   . Peptic ulcer   . Peripheral neuropathy   . Pneumonia    hospitalized in March 2016   . PONV (postoperative nausea and vomiting)   . Pre-diabetes    before bariatric surgery  . Superficial basal cell carcinoma (BCC) 11/07/2018   Right Tip of Nose    Past Surgical History:  Procedure Laterality Date  . ABDOMINAL HYSTERECTOMY    . CAPSULOTOMY Right 09/08/2018   Procedure: CAPSULOTOMY 2nd digit;  Surgeon: Evelina Bucy, DPM;  Location: [place];  Service: Podiatry;  Laterality: Right;  . CARDIAC CATHETERIZATION  2005  . CHOLECYSTECTOMY    . COLONOSCOPY WITH ESOPHAGOGASTRODUODENOSCOPY (EGD) N/A 06/08/2013   [id number] antral ulcer status post biopsy (benign). Hiatal hernia/ Tortuous colon. Internal hemorrhoids. Colonic diverticulosis. Single colonic polyp removed as described above (too small to process). Next TCS 05/2023  . ESOPHAGOGASTRODUODENOSCOPY N/A 05/20/2014   [id number] ulcer ranging between 3-45mm in size was found in the gastric antrum with satellite erosions. Status post ulcer body. Likely NSAID effect.  . ESOPHAGOGASTRODUODENOSCOPY (EGD) WITH PROPOFOL N/A 06/28/2019   RMR: normal esophagus s/p dilation for dysphagia. normal stomach/duodenum  . HALLUX VALGUS LAPIDUS Right 09/08/2018   Procedure: HALLUX VALGUS LAPIDUS;  Surgeon: Evelina Bucy, DPM;  Location: [place];  Service: Podiatry;  Laterality: Right;  . HAMMER TOE SURGERY Right 09/08/2018   Procedure: HAMMER TOE CORRECTION 2nd digit;  Surgeon: Evelina Bucy, DPM;  Location: [place];  Service: Podiatry;  Laterality: Right;  . INTRAMEDULLARY (IM) NAIL INTERTROCHANTERIC Left 10/05/2013   Procedure: ORIF LEFT HIP  INTERTROCHANTRIC FRACTURE;  Surgeon: [name], MD;  Location: [place];  Service: Orthopedics;  Laterality: Left;  . KNEE SURGERY     right and left  . LAPAROSCOPIC GASTRIC  SLEEVE RESECTION WITH HIATAL HERNIA REPAIR N/A 07/19/2016   Procedure: LAPAROSCOPIC GASTRIC SLEEVE RESECTION WITH HIATAL HERNIA REPAIR;  Surgeon: Mickeal Skinner, MD;  [facility];  Service: General;  Laterality: N/A;  . Venia Minks DILATION N/A 06/28/2019   Procedure: Keturah Shavers;  Surgeon: Daneil Dolin, MD;  [facility];  Service: Endoscopy;  Laterality: N/A;  . METATARSAL OSTEOTOMY Right 09/08/2018   Procedure: METATARSAL OSTEOTOMY 5th digit;  Surgeon: Evelina Bucy, DPM;  Location: [place];  Service: Podiatry;  Laterality: Right;  . TOTAL KNEE ARTHROPLASTY Right 01/29/2013   Procedure: TOTAL KNEE ARTHROPLASTY WITH HARDWARE REMOVAL;  Surgeon: [name], MD;  Location: [place];  Service: Orthopedics;  Laterality: Right;  DEPUY/SIGMA RP, SYNTHES SCREWDRIVERS  . TOTAL KNEE ARTHROPLASTY Left 06/11/2013   Dr Mayer Camel  . TOTAL KNEE ARTHROPLASTY Left 06/11/2013   Procedure: TOTAL KNEE  ARTHROPLASTY;  Surgeon: [name], MD;  Location: [place];  Service: Orthopedics;  Laterality: Left;    Family History  Problem Relation Age of Onset  . Ulcers Father   . Prostate cancer Father   . Kidney cancer Father   . Hypertension Father   . Parkinson's disease Father   . Hypercholesterolemia Father   . Heart disease Father   . Hypotension Mother   . Hypercholesterolemia Mother   . Lung cancer Mother   . Bone cancer Mother   . Neuropathy Neg Hx   . Colon cancer Neg Hx     Social History   Socioeconomic History  . Marital status: Widowed    Spouse name: Not on file  . Number of children: 3  . Years of education: 54  . Highest education level: Not on file  Occupational History  . Occupation: Home health aid  Tobacco Use  . Smoking status: Former Smoker    Packs/day: 0.50    Years: 43.00    Pack years: 21.50    Types: Cigarettes    Quit date: 03/26/2018    Years since quitting: 1.5  . Smokeless tobacco: Never Used  Substance and Sexual Activity  . Alcohol use: No     Alcohol/week: 0.0 standard drinks  . Drug use: No  . Sexual activity: Yes  Other Topics Concern  . Not on file  Social History Narrative   Lives at home with her brother.   Right-handed.   1-2 cups caffeine daily.   Social Determinants of Health   Financial Resource Strain:   . Difficulty of Paying Living Expenses: Not on file  Food Insecurity:   . Worried About Charity fundraiser in the Last Year: Not on file  . Ran Out of Food in the Last Year: Not on file  Transportation Needs:   . Lack of Transportation (Medical): Not on file  . Lack of Transportation (Non-Medical): Not on file  Physical Activity:   . Days of Exercise per Week: Not on file  . Minutes of Exercise per Session: Not on file  Stress:   . Feeling of Stress : Not on file  Social Connections:   . Frequency of Communication with Friends and Family: Not on file  . Frequency of Social Gatherings with Friends and Family: Not on file  . Attends Religious Services: Not on file  . Active Member of Clubs or Organizations: Not on file  . Attends Archivist Meetings: Not on file  . Marital Status: Not on file  Intimate Partner Violence:   . Fear of Current or Ex-Partner: Not on file  . Emotionally Abused: Not on file  . Physically Abused: Not on file  . Sexually Abused: Not on file      ROS:  General: Negative for anorexia, weight loss, fever, chills, fatigue, weakness. Eyes: Negative for vision changes.  ENT: Negative for hoarseness, difficulty swallowing , nasal congestion. CV: Negative for chest pain, angina, palpitations, dyspnea on exertion, peripheral edema.  Respiratory: Negative for dyspnea at rest, dyspnea on exertion, cough, sputum, wheezing.  GI: See history of present illness. GU:  Negative for dysuria, hematuria, urinary incontinence, urinary frequency, nocturnal urination.  MS: Negative for joint pain, low back pain.  Derm: Negative for rash or itching.  Neuro: Negative for weakness,  abnormal sensation, seizure, frequent headaches, memory loss, confusion.  Psych: Negative for anxiety, depression, suicidal ideation, hallucinations.  Endo: Negative for unusual weight change.  Heme: Negative for bruising  or bleeding. Allergy: Negative for rash or hives.   Observations/Objective: Appears well, good color. No SOB.  Assessment and Plan: GERD: doing well at this time. Pantoprazole 40mg  twice daily is helping. She wants to try omeprazole 20mg  once daily as she tried her brother's and it helped a lot. We will try and adjust to BID if needed. RX to be sent.  Dysphagia: resolved.  Constipation: likely due to opioids. She is not taking her Movantik every day and taking both Movantik and Linzess only on weekends. Can go a week without BM. Instructed to take movantik every day that she takes her pain medication (patient takes every day). Can still use Linzess on occasion if needed.   Return to the office in one year or sooner if needed.   Follow Up Instructions:    I discussed the assessment and treatment plan with the patient. The patient was provided an opportunity to ask questions and all were answered. The patient agreed with the plan and demonstrated an understanding of the instructions. AVS mailed to patient's home address.   The patient was advised to call back or seek an in-person evaluation if the symptoms worsen or if the condition fails to improve as anticipated.  I provided 15 minutes of virtual face-to-face time during this encounter.   Neil Crouch, PA-C

## 2019-10-05 ENCOUNTER — Ambulatory Visit: Payer: Medicare Other | Admitting: Gastroenterology

## 2019-10-05 ENCOUNTER — Ambulatory Visit (INDEPENDENT_AMBULATORY_CARE_PROVIDER_SITE_OTHER): Payer: Medicare Other | Admitting: Gastroenterology

## 2019-10-05 ENCOUNTER — Encounter: Payer: Self-pay | Admitting: Gastroenterology

## 2019-10-05 DIAGNOSIS — K5909 Other constipation: Secondary | ICD-10-CM

## 2019-10-05 DIAGNOSIS — R1310 Dysphagia, unspecified: Secondary | ICD-10-CM | POA: Diagnosis not present

## 2019-10-05 DIAGNOSIS — K219 Gastro-esophageal reflux disease without esophagitis: Secondary | ICD-10-CM

## 2019-10-05 DIAGNOSIS — R1319 Other dysphagia: Secondary | ICD-10-CM

## 2019-10-05 MED ORDER — OMEPRAZOLE 20 MG PO CPDR: 20.0000 mg | DELAYED_RELEASE_CAPSULE | Freq: Every day | ORAL | 3 refills | Status: DC

## 2019-10-05 NOTE — Patient Instructions (Signed)
1. Recommend taking Movantik 25 mg daily given your constipation.  Movantik is only meant to be taking while on pain medication to help offset opioid-induced constipation. 2. If you continue to have constipation even after being on Movantik daily, you can utilize Linzess as needed. 3. Stop pantoprazole.  Start omeprazole 20 mg daily before breakfast.  Prescription sent to your pharmacy.  Let me know if you feel like you need twice daily dosing to control your symptoms. 4. Return to the office in 1 year or call sooner if needed.

## 2019-10-31 DIAGNOSIS — Z1389 Encounter for screening for other disorder: Secondary | ICD-10-CM | POA: Diagnosis not present

## 2019-10-31 DIAGNOSIS — I10 Essential (primary) hypertension: Secondary | ICD-10-CM | POA: Diagnosis not present

## 2019-10-31 DIAGNOSIS — G64 Other disorders of peripheral nervous system: Secondary | ICD-10-CM | POA: Diagnosis not present

## 2019-10-31 DIAGNOSIS — G894 Chronic pain syndrome: Secondary | ICD-10-CM | POA: Diagnosis not present

## 2019-10-31 DIAGNOSIS — R5383 Other fatigue: Secondary | ICD-10-CM | POA: Diagnosis not present

## 2019-10-31 DIAGNOSIS — E538 Deficiency of other specified B group vitamins: Secondary | ICD-10-CM | POA: Diagnosis not present

## 2019-11-06 ENCOUNTER — Other Ambulatory Visit: Payer: Self-pay | Admitting: Internal Medicine

## 2019-11-06 ENCOUNTER — Other Ambulatory Visit: Payer: Self-pay

## 2019-11-06 DIAGNOSIS — M5412 Radiculopathy, cervical region: Secondary | ICD-10-CM

## 2019-11-06 NOTE — Patient Outreach (Signed)
[facility]) Care Management  11/06/2019  SELEAH RUBEN 1956-07-11 [id number]   Medication Adherence call to Mrs. Dawn Brach patient ask to call back at a later time Mrs. Denice Paradise is showing past due on Victoza under [place].  [place] [facility] Direct Dial [phone]  Fax [phone] [email or web address]

## 2019-11-06 NOTE — Progress Notes (Signed)
  Subjective:  Patient ID: [name], female    DOB: 18-Jun-1956,  MRN: [id number]  Chief Complaint  Patient presents with  . Routine Post Op    Pt states right foot is regaining sensation, and that the dorsal aspect and 1st digit are painful. Pt states right ankle swelling on lateral aspect.   . Nail Problem    Pt requests oral anti-fungal for her nails.    64 y.o. female presents with the above complaint. History confirmed with patient.   Objective:  Physical Exam: warm, good capillary refill, no trophic changes or ulcerative lesions, normal DP and PT pulses and normal sensory exam. Nails thickened and dystrophic with some proximal clearing. Right Foot: no pain to palpation right foot, no warmth erythema. No edema.   Assessment:   1. Hallux valgus, right   2. 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 Pain in right foot   4. Onychomycosis      Plan:  Patient was evaluated and treated and all questions answered.  Hallux Valgus Right -Improving. Continue bone stimulator  Onychomycosis -Continue Fluconazole. Refilled.  Return in about 6 weeks (around 08/17/2019) for Hallux valgus f/u, nail fungus f/u .   MDM

## 2019-11-13 ENCOUNTER — Other Ambulatory Visit: Payer: Self-pay | Admitting: Neurology

## 2019-11-13 ENCOUNTER — Other Ambulatory Visit (HOSPITAL_COMMUNITY): Payer: Self-pay | Admitting: Neurology

## 2019-11-13 DIAGNOSIS — G629 Polyneuropathy, unspecified: Secondary | ICD-10-CM | POA: Diagnosis not present

## 2019-11-13 DIAGNOSIS — M25512 Pain in left shoulder: Secondary | ICD-10-CM | POA: Diagnosis not present

## 2019-11-13 DIAGNOSIS — G8190 Hemiplegia, unspecified affecting unspecified side: Secondary | ICD-10-CM | POA: Diagnosis not present

## 2019-11-13 DIAGNOSIS — G5732 Lesion of lateral popliteal nerve, left lower limb: Secondary | ICD-10-CM | POA: Diagnosis not present

## 2019-11-15 DIAGNOSIS — G603 Idiopathic progressive neuropathy: Secondary | ICD-10-CM | POA: Diagnosis not present

## 2019-11-15 DIAGNOSIS — G5732 Lesion of lateral popliteal nerve, left lower limb: Secondary | ICD-10-CM | POA: Diagnosis not present

## 2019-11-16 DIAGNOSIS — G629 Polyneuropathy, unspecified: Secondary | ICD-10-CM | POA: Diagnosis not present

## 2019-12-06 ENCOUNTER — Ambulatory Visit (HOSPITAL_COMMUNITY): Payer: Medicare Other

## 2019-12-14 DIAGNOSIS — J449 Chronic obstructive pulmonary disease, unspecified: Secondary | ICD-10-CM | POA: Diagnosis not present

## 2019-12-14 DIAGNOSIS — R600 Localized edema: Secondary | ICD-10-CM | POA: Diagnosis not present

## 2019-12-14 DIAGNOSIS — I10 Essential (primary) hypertension: Secondary | ICD-10-CM | POA: Diagnosis not present

## 2019-12-14 DIAGNOSIS — K219 Gastro-esophageal reflux disease without esophagitis: Secondary | ICD-10-CM | POA: Diagnosis not present

## 2019-12-18 ENCOUNTER — Ambulatory Visit (HOSPITAL_COMMUNITY): Payer: Medicare Other

## 2020-01-10 ENCOUNTER — Ambulatory Visit (HOSPITAL_COMMUNITY)
Admission: RE | Admit: 2020-01-10 | Discharge: 2020-01-10 | Disposition: A | Discharge status: Home / Self Care | Payer: Medicare Other | Source: Ambulatory Visit | Attending: Internal Medicine | Admitting: Internal Medicine

## 2020-01-10 ENCOUNTER — Other Ambulatory Visit: Payer: Self-pay

## 2020-01-10 ENCOUNTER — Ambulatory Visit (HOSPITAL_COMMUNITY)
Admission: RE | Admit: 2020-01-10 | Discharge: 2020-01-10 | Disposition: A | Discharge status: Home / Self Care | Payer: Medicare Other | Source: Ambulatory Visit | Attending: Neurology | Admitting: Neurology

## 2020-01-10 DIAGNOSIS — G8190 Hemiplegia, unspecified affecting unspecified side: Secondary | ICD-10-CM | POA: Insufficient documentation

## 2020-01-10 DIAGNOSIS — R519 Headache, unspecified: Secondary | ICD-10-CM | POA: Diagnosis not present

## 2020-01-10 DIAGNOSIS — M542 Cervicalgia: Secondary | ICD-10-CM | POA: Diagnosis not present

## 2020-01-10 DIAGNOSIS — M5412 Radiculopathy, cervical region: Secondary | ICD-10-CM

## 2020-01-16 DIAGNOSIS — G629 Polyneuropathy, unspecified: Secondary | ICD-10-CM | POA: Diagnosis not present

## 2020-01-16 DIAGNOSIS — Z79899 Other long term (current) drug therapy: Secondary | ICD-10-CM | POA: Diagnosis not present

## 2020-01-16 DIAGNOSIS — M25512 Pain in left shoulder: Secondary | ICD-10-CM | POA: Diagnosis not present

## 2020-01-16 DIAGNOSIS — G5732 Lesion of lateral popliteal nerve, left lower limb: Secondary | ICD-10-CM | POA: Diagnosis not present

## 2020-01-17 DIAGNOSIS — M47812 Spondylosis without myelopathy or radiculopathy, cervical region: Secondary | ICD-10-CM | POA: Diagnosis not present

## 2020-02-11 DIAGNOSIS — M799 Soft tissue disorder, unspecified: Secondary | ICD-10-CM | POA: Diagnosis not present

## 2020-02-11 DIAGNOSIS — M6281 Muscle weakness (generalized): Secondary | ICD-10-CM | POA: Diagnosis not present

## 2020-02-11 DIAGNOSIS — M542 Cervicalgia: Secondary | ICD-10-CM | POA: Diagnosis not present

## 2020-02-11 DIAGNOSIS — K219 Gastro-esophageal reflux disease without esophagitis: Secondary | ICD-10-CM | POA: Diagnosis not present

## 2020-02-11 DIAGNOSIS — I10 Essential (primary) hypertension: Secondary | ICD-10-CM | POA: Diagnosis not present

## 2020-02-11 DIAGNOSIS — M2569 Stiffness of other specified joint, not elsewhere classified: Secondary | ICD-10-CM | POA: Diagnosis not present

## 2020-02-11 DIAGNOSIS — R293 Abnormal posture: Secondary | ICD-10-CM | POA: Diagnosis not present

## 2020-02-11 DIAGNOSIS — M47816 Spondylosis without myelopathy or radiculopathy, lumbar region: Secondary | ICD-10-CM | POA: Diagnosis not present

## 2020-02-11 DIAGNOSIS — J449 Chronic obstructive pulmonary disease, unspecified: Secondary | ICD-10-CM | POA: Diagnosis not present

## 2020-02-11 DIAGNOSIS — G894 Chronic pain syndrome: Secondary | ICD-10-CM | POA: Diagnosis not present

## 2020-02-13 ENCOUNTER — Encounter (HOSPITAL_COMMUNITY): Payer: Self-pay

## 2020-02-24 NOTE — Progress Notes (Signed)
  Subjective:  Patient ID: [name], female    DOB: 1956/06/16,  MRN: [id number]  Chief Complaint  Patient presents with  . Follow-up    R foot (bunions), L hallux (nail fungus). Pt stated, "The right foot is okay. Some swelling, so I elevate my foot. The L nail looks better. I would like to continue taking the antifungal medication".    64 y.o. female presents with the above complaint. History confirmed with patient.   Objective:  Physical Exam: warm, good capillary refill, no trophic changes or ulcerative lesions, normal DP and PT pulses and normal sensory exam. Nail thickening dystrophic bilateral Left Foot: Hallux valgus deformity with pain to palpation Right Foot: Reduced but slightly evident bunion deformity no pain to palpation no gross hypermobility first tarsometatarsal joint no pain palpation first tarsometatarsal joint  No images are attached to the encounter.  Radiographs: X-ray of the right foot: Evidence of prior bunionectomy with break of screw but with consolidation noted across the arthrodesis site Assessment:   1. Hallux abductovalgus with bunions, right   2. 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 Onychomycosis   4. Failed orthopedic implant, subsequent encounter      Plan:  Patient was evaluated and treated and all questions answered.  Hallux valgus right with history of correction with nonunion -Continue Ramsden as needed -X-rays do show increased consolidation -Follow-up when she would like to discuss left foot correction  Onychomycosis -Repeat clinical  Return if symptoms worsen or fail to improve.

## 2020-03-20 ENCOUNTER — Other Ambulatory Visit (HOSPITAL_COMMUNITY): Payer: Self-pay | Admitting: Internal Medicine

## 2020-03-20 DIAGNOSIS — R059 Cough, unspecified: Secondary | ICD-10-CM

## 2020-03-20 DIAGNOSIS — N390 Urinary tract infection, site not specified: Secondary | ICD-10-CM | POA: Diagnosis not present

## 2020-03-20 DIAGNOSIS — J449 Chronic obstructive pulmonary disease, unspecified: Secondary | ICD-10-CM | POA: Diagnosis not present

## 2020-03-20 DIAGNOSIS — G894 Chronic pain syndrome: Secondary | ICD-10-CM | POA: Diagnosis not present

## 2020-03-21 DIAGNOSIS — G894 Chronic pain syndrome: Secondary | ICD-10-CM | POA: Diagnosis not present

## 2020-03-21 DIAGNOSIS — I10 Essential (primary) hypertension: Secondary | ICD-10-CM | POA: Diagnosis not present

## 2020-03-21 DIAGNOSIS — K219 Gastro-esophageal reflux disease without esophagitis: Secondary | ICD-10-CM | POA: Diagnosis not present

## 2020-03-28 ENCOUNTER — Ambulatory Visit (HOSPITAL_COMMUNITY)
Admission: RE | Admit: 2020-03-28 | Discharge: 2020-03-28 | Disposition: A | Discharge status: Home / Self Care | Payer: Medicare Other | Source: Ambulatory Visit | Attending: Internal Medicine | Admitting: Internal Medicine

## 2020-03-28 ENCOUNTER — Other Ambulatory Visit: Payer: Self-pay

## 2020-03-28 DIAGNOSIS — J90 Pleural effusion, not elsewhere classified: Secondary | ICD-10-CM | POA: Diagnosis not present

## 2020-03-28 DIAGNOSIS — R05 Cough: Secondary | ICD-10-CM | POA: Diagnosis not present

## 2020-03-28 DIAGNOSIS — R059 Cough, unspecified: Secondary | ICD-10-CM

## 2020-05-01 DIAGNOSIS — J449 Chronic obstructive pulmonary disease, unspecified: Secondary | ICD-10-CM | POA: Diagnosis not present

## 2020-05-01 DIAGNOSIS — I10 Essential (primary) hypertension: Secondary | ICD-10-CM | POA: Diagnosis not present

## 2020-05-01 DIAGNOSIS — G894 Chronic pain syndrome: Secondary | ICD-10-CM | POA: Diagnosis not present

## 2020-06-03 DIAGNOSIS — Z23 Encounter for immunization: Secondary | ICD-10-CM | POA: Diagnosis not present

## 2020-07-10 ENCOUNTER — Other Ambulatory Visit: Payer: Self-pay | Admitting: Internal Medicine

## 2020-07-11 DIAGNOSIS — G894 Chronic pain syndrome: Secondary | ICD-10-CM | POA: Diagnosis not present

## 2020-07-11 DIAGNOSIS — E7849 Other hyperlipidemia: Secondary | ICD-10-CM | POA: Diagnosis not present

## 2020-07-11 DIAGNOSIS — I10 Essential (primary) hypertension: Secondary | ICD-10-CM | POA: Diagnosis not present

## 2020-08-25 DIAGNOSIS — Z0001 Encounter for general adult medical examination with abnormal findings: Secondary | ICD-10-CM | POA: Diagnosis not present

## 2020-08-25 DIAGNOSIS — Z1389 Encounter for screening for other disorder: Secondary | ICD-10-CM | POA: Diagnosis not present

## 2020-08-25 DIAGNOSIS — G894 Chronic pain syndrome: Secondary | ICD-10-CM | POA: Diagnosis not present

## 2020-09-09 ENCOUNTER — Other Ambulatory Visit: Payer: Self-pay | Admitting: Gastroenterology

## 2020-09-25 ENCOUNTER — Encounter: Payer: Self-pay | Admitting: Internal Medicine

## 2020-09-25 DIAGNOSIS — G894 Chronic pain syndrome: Secondary | ICD-10-CM | POA: Diagnosis not present

## 2020-09-25 DIAGNOSIS — I10 Essential (primary) hypertension: Secondary | ICD-10-CM | POA: Diagnosis not present

## 2020-10-23 DIAGNOSIS — G894 Chronic pain syndrome: Secondary | ICD-10-CM | POA: Diagnosis not present

## 2020-10-23 DIAGNOSIS — I10 Essential (primary) hypertension: Secondary | ICD-10-CM | POA: Diagnosis not present

## 2020-10-23 DIAGNOSIS — J449 Chronic obstructive pulmonary disease, unspecified: Secondary | ICD-10-CM | POA: Diagnosis not present

## 2020-11-21 DIAGNOSIS — G8929 Other chronic pain: Secondary | ICD-10-CM | POA: Diagnosis not present

## 2020-11-21 DIAGNOSIS — I10 Essential (primary) hypertension: Secondary | ICD-10-CM | POA: Diagnosis not present

## 2020-11-21 DIAGNOSIS — G894 Chronic pain syndrome: Secondary | ICD-10-CM | POA: Diagnosis not present

## 2020-11-28 ENCOUNTER — Other Ambulatory Visit (HOSPITAL_COMMUNITY): Payer: Self-pay | Admitting: Internal Medicine

## 2020-11-28 DIAGNOSIS — Z1231 Encounter for screening mammogram for malignant neoplasm of breast: Secondary | ICD-10-CM

## 2020-12-22 DIAGNOSIS — K219 Gastro-esophageal reflux disease without esophagitis: Secondary | ICD-10-CM | POA: Diagnosis not present

## 2020-12-22 DIAGNOSIS — G894 Chronic pain syndrome: Secondary | ICD-10-CM | POA: Diagnosis not present

## 2020-12-24 ENCOUNTER — Ambulatory Visit (HOSPITAL_COMMUNITY)
Admission: RE | Admit: 2020-12-24 | Discharge: 2020-12-24 | Disposition: A | Discharge status: Home / Self Care | Payer: Medicare Other | Source: Ambulatory Visit | Attending: Internal Medicine | Admitting: Internal Medicine

## 2020-12-24 ENCOUNTER — Other Ambulatory Visit: Payer: Self-pay

## 2020-12-24 DIAGNOSIS — Z1231 Encounter for screening mammogram for malignant neoplasm of breast: Secondary | ICD-10-CM | POA: Insufficient documentation

## 2021-01-20 DIAGNOSIS — G8929 Other chronic pain: Secondary | ICD-10-CM | POA: Diagnosis not present

## 2021-01-20 DIAGNOSIS — G894 Chronic pain syndrome: Secondary | ICD-10-CM | POA: Diagnosis not present

## 2021-01-20 DIAGNOSIS — K219 Gastro-esophageal reflux disease without esophagitis: Secondary | ICD-10-CM | POA: Diagnosis not present

## 2021-01-25 DIAGNOSIS — L299 Pruritus, unspecified: Secondary | ICD-10-CM | POA: Diagnosis not present

## 2021-01-25 DIAGNOSIS — R21 Rash and other nonspecific skin eruption: Secondary | ICD-10-CM | POA: Diagnosis not present

## 2021-01-25 DIAGNOSIS — Z79899 Other long term (current) drug therapy: Secondary | ICD-10-CM | POA: Diagnosis not present

## 2021-01-25 DIAGNOSIS — G8929 Other chronic pain: Secondary | ICD-10-CM | POA: Diagnosis not present

## 2021-02-11 ENCOUNTER — Encounter (HOSPITAL_COMMUNITY): Payer: Self-pay | Admitting: *Deleted

## 2021-02-19 DIAGNOSIS — G894 Chronic pain syndrome: Secondary | ICD-10-CM | POA: Diagnosis not present

## 2021-02-19 DIAGNOSIS — J449 Chronic obstructive pulmonary disease, unspecified: Secondary | ICD-10-CM | POA: Diagnosis not present

## 2021-03-20 DIAGNOSIS — I700 Atherosclerosis of aorta: Secondary | ICD-10-CM | POA: Diagnosis not present

## 2021-03-20 DIAGNOSIS — I10 Essential (primary) hypertension: Secondary | ICD-10-CM | POA: Diagnosis not present

## 2021-03-20 DIAGNOSIS — G894 Chronic pain syndrome: Secondary | ICD-10-CM | POA: Diagnosis not present

## 2021-04-15 DIAGNOSIS — J449 Chronic obstructive pulmonary disease, unspecified: Secondary | ICD-10-CM | POA: Diagnosis not present

## 2021-04-15 DIAGNOSIS — M47812 Spondylosis without myelopathy or radiculopathy, cervical region: Secondary | ICD-10-CM | POA: Diagnosis not present

## 2021-04-15 DIAGNOSIS — G894 Chronic pain syndrome: Secondary | ICD-10-CM | POA: Diagnosis not present

## 2021-04-15 DIAGNOSIS — M4802 Spinal stenosis, cervical region: Secondary | ICD-10-CM | POA: Diagnosis not present

## 2021-05-20 DIAGNOSIS — J449 Chronic obstructive pulmonary disease, unspecified: Secondary | ICD-10-CM | POA: Diagnosis not present

## 2021-05-20 DIAGNOSIS — E7849 Other hyperlipidemia: Secondary | ICD-10-CM | POA: Diagnosis not present

## 2021-05-20 DIAGNOSIS — I10 Essential (primary) hypertension: Secondary | ICD-10-CM | POA: Diagnosis not present

## 2021-05-20 DIAGNOSIS — G894 Chronic pain syndrome: Secondary | ICD-10-CM | POA: Diagnosis not present

## 2021-06-11 DIAGNOSIS — K219 Gastro-esophageal reflux disease without esophagitis: Secondary | ICD-10-CM | POA: Diagnosis not present

## 2021-06-11 DIAGNOSIS — E782 Mixed hyperlipidemia: Secondary | ICD-10-CM | POA: Diagnosis not present

## 2021-06-11 DIAGNOSIS — J449 Chronic obstructive pulmonary disease, unspecified: Secondary | ICD-10-CM | POA: Diagnosis not present

## 2021-06-11 DIAGNOSIS — G8929 Other chronic pain: Secondary | ICD-10-CM | POA: Diagnosis not present

## 2021-06-11 DIAGNOSIS — G894 Chronic pain syndrome: Secondary | ICD-10-CM | POA: Diagnosis not present

## 2021-06-11 DIAGNOSIS — Z23 Encounter for immunization: Secondary | ICD-10-CM | POA: Diagnosis not present

## 2021-06-11 DIAGNOSIS — I10 Essential (primary) hypertension: Secondary | ICD-10-CM | POA: Diagnosis not present

## 2021-07-15 DIAGNOSIS — J449 Chronic obstructive pulmonary disease, unspecified: Secondary | ICD-10-CM | POA: Diagnosis not present

## 2021-07-15 DIAGNOSIS — G894 Chronic pain syndrome: Secondary | ICD-10-CM | POA: Diagnosis not present

## 2021-07-15 DIAGNOSIS — I10 Essential (primary) hypertension: Secondary | ICD-10-CM | POA: Diagnosis not present

## 2021-08-14 DIAGNOSIS — G894 Chronic pain syndrome: Secondary | ICD-10-CM | POA: Diagnosis not present

## 2021-08-14 DIAGNOSIS — J449 Chronic obstructive pulmonary disease, unspecified: Secondary | ICD-10-CM | POA: Diagnosis not present

## 2021-08-27 DIAGNOSIS — I10 Essential (primary) hypertension: Secondary | ICD-10-CM | POA: Diagnosis not present

## 2021-08-27 DIAGNOSIS — G894 Chronic pain syndrome: Secondary | ICD-10-CM | POA: Diagnosis not present

## 2021-10-12 DIAGNOSIS — G894 Chronic pain syndrome: Secondary | ICD-10-CM | POA: Diagnosis not present

## 2021-10-12 DIAGNOSIS — I10 Essential (primary) hypertension: Secondary | ICD-10-CM | POA: Diagnosis not present

## 2021-12-08 DIAGNOSIS — I10 Essential (primary) hypertension: Secondary | ICD-10-CM | POA: Diagnosis not present

## 2021-12-08 DIAGNOSIS — K219 Gastro-esophageal reflux disease without esophagitis: Secondary | ICD-10-CM | POA: Diagnosis not present

## 2021-12-08 DIAGNOSIS — G894 Chronic pain syndrome: Secondary | ICD-10-CM | POA: Diagnosis not present

## 2021-12-10 ENCOUNTER — Other Ambulatory Visit (HOSPITAL_COMMUNITY): Payer: Self-pay | Admitting: Internal Medicine

## 2021-12-10 DIAGNOSIS — Z1231 Encounter for screening mammogram for malignant neoplasm of breast: Secondary | ICD-10-CM

## 2022-01-07 ENCOUNTER — Ambulatory Visit (HOSPITAL_COMMUNITY): Payer: Medicare Other

## 2022-01-07 DIAGNOSIS — I10 Essential (primary) hypertension: Secondary | ICD-10-CM | POA: Diagnosis not present

## 2022-01-07 DIAGNOSIS — E782 Mixed hyperlipidemia: Secondary | ICD-10-CM | POA: Diagnosis not present

## 2022-01-07 DIAGNOSIS — J449 Chronic obstructive pulmonary disease, unspecified: Secondary | ICD-10-CM | POA: Diagnosis not present

## 2022-01-07 DIAGNOSIS — G894 Chronic pain syndrome: Secondary | ICD-10-CM | POA: Diagnosis not present

## 2022-01-08 ENCOUNTER — Ambulatory Visit (HOSPITAL_COMMUNITY)
Admission: RE | Admit: 2022-01-08 | Discharge: 2022-01-08 | Disposition: A | Discharge status: Home / Self Care | Payer: Medicare Other | Source: Ambulatory Visit | Attending: Internal Medicine | Admitting: Internal Medicine

## 2022-01-08 DIAGNOSIS — Z1231 Encounter for screening mammogram for malignant neoplasm of breast: Secondary | ICD-10-CM | POA: Diagnosis not present

## 2022-02-03 DIAGNOSIS — J449 Chronic obstructive pulmonary disease, unspecified: Secondary | ICD-10-CM | POA: Diagnosis not present

## 2022-02-03 DIAGNOSIS — G894 Chronic pain syndrome: Secondary | ICD-10-CM | POA: Diagnosis not present

## 2022-02-03 DIAGNOSIS — I10 Essential (primary) hypertension: Secondary | ICD-10-CM | POA: Diagnosis not present

## 2022-02-04 IMAGING — MR MR HEAD W/O CM
9 of 11 series · 31 of 48 positions shown · non-contrast
Comparison: CT head 03/14/2014

CLINICAL DATA: Hemiplegia.  Headache.

EXAM:
MRI HEAD WITHOUT CONTRAST
TECHNIQUE: Multiplanar, multiecho pulse sequences of the brain and surrounding
structures were obtained without intravenous contrast.

[Series 2: T1 · sagittal · 5.0mm · 0.41mm/px · 2 of 20 slices shown (1 of 2)]
[im 1/20]
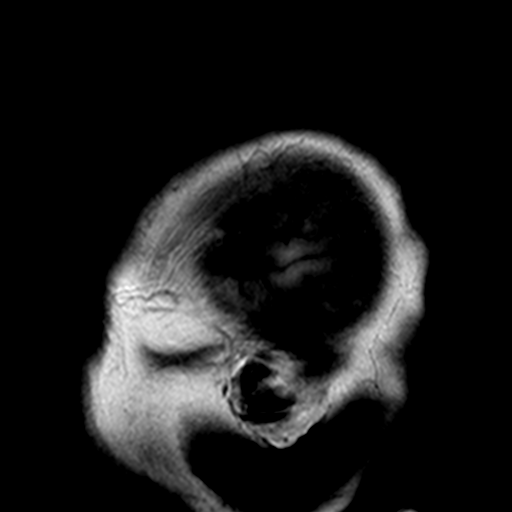
[im 20/20]
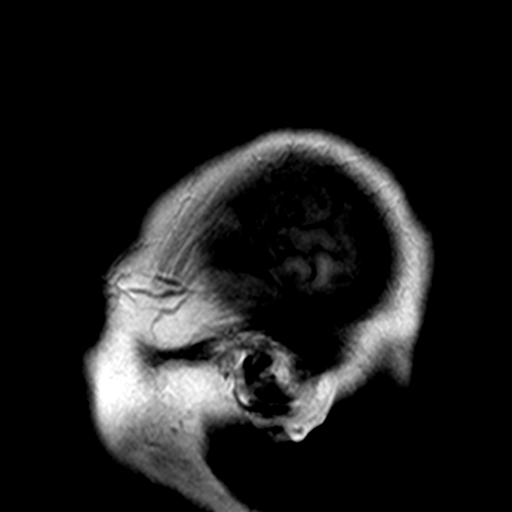

[Series 3: DWI · axial · 3.0mm · 0.82mm/px · z∈[-77,+70]mm · 4 of 50 slices shown (1 of 2)]
[im 1/50]
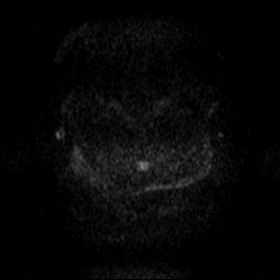
[im 17/50]
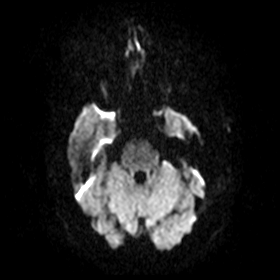
[im 33/50]
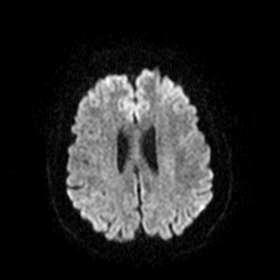
[im 50/50]
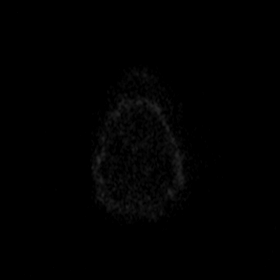

[Series 7: DWI · coronal · 5.0mm · 0.51mm/px · 3 of 32 slices shown (2 of 2)]
[im 1/32]
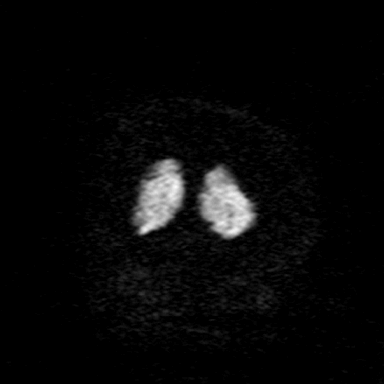
[im 16/32]
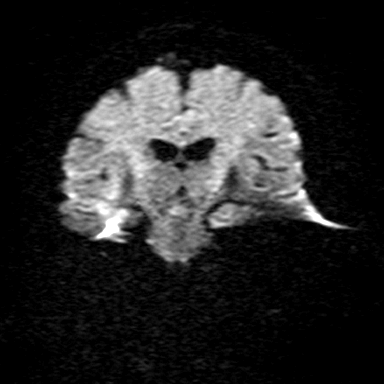
[im 32/32]
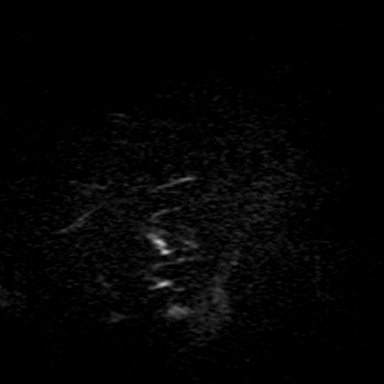

[Series 9: T2 · axial · 5.0mm · 0.60mm/px · z∈[-75,+68]mm · 2 of 23 slices shown (1 of 3)]
[im 1/23]
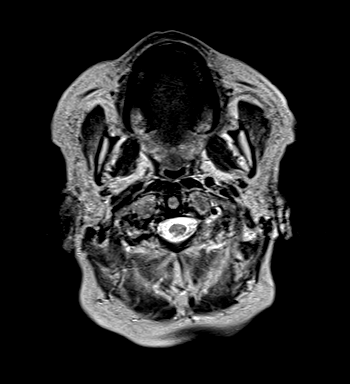
[im 23/23]
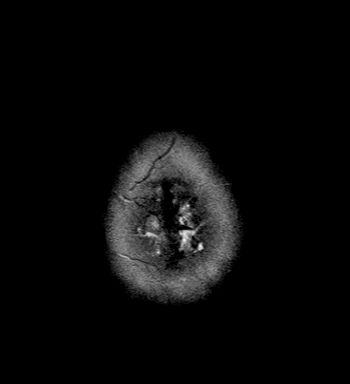

[Series 10: T2 · axial · 4.0mm · 0.45mm/px · z∈[-76,+69]mm · 2 of 30 slices shown (2 of 3)]
[im 1/30]
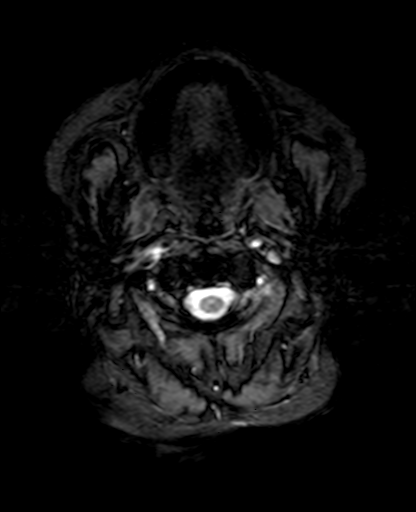
[im 30/30]
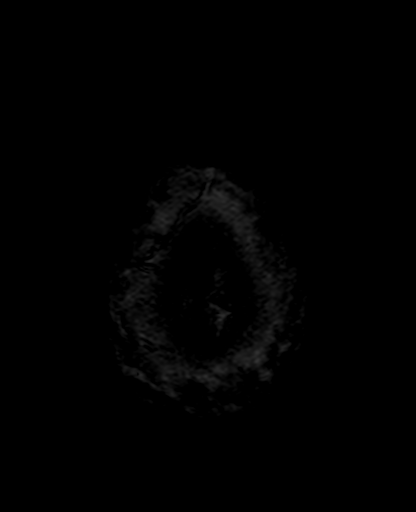

[Series 11: FLAIR · axial · 3.0mm · 0.36mm/px · z∈[-73,+65]mm · 4 of 47 slices shown (1 of 2)]
[im 1/47]
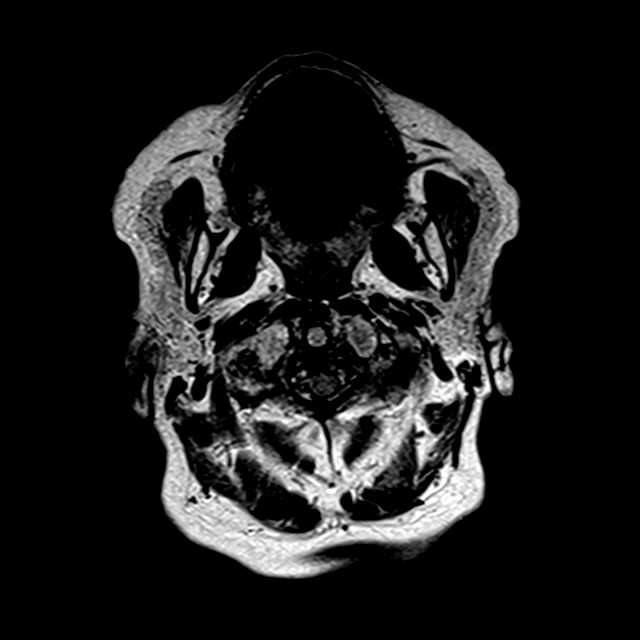
[im 16/47]
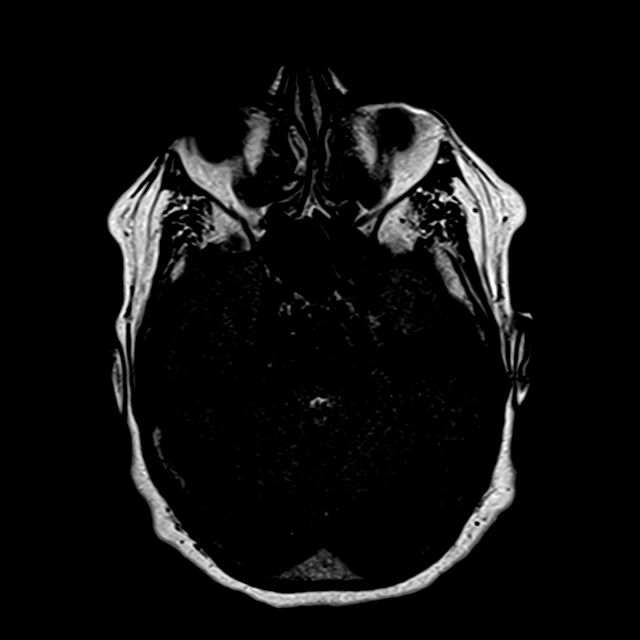
[im 31/47]
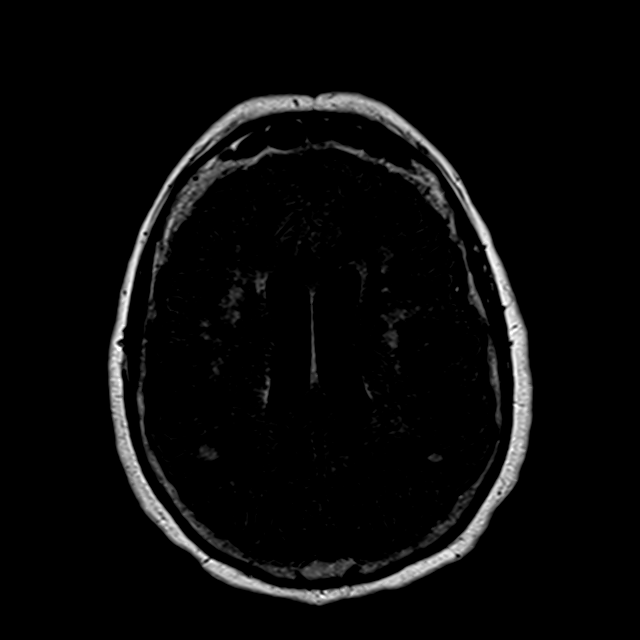
[im 47/47]
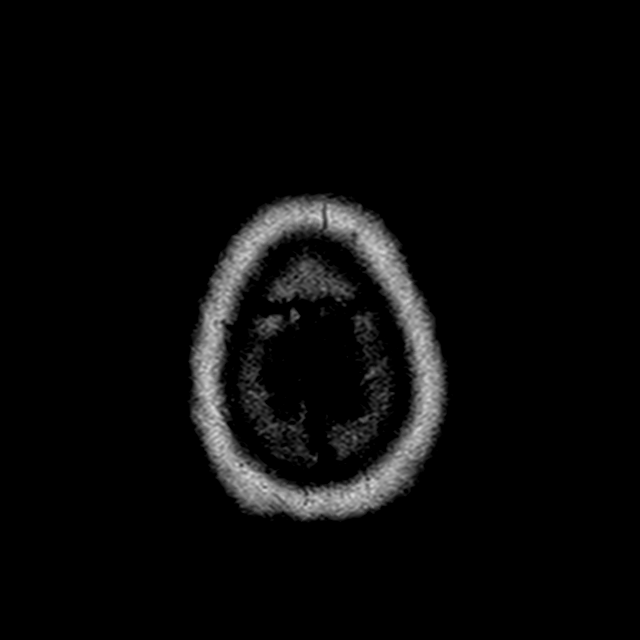

[Series 12: T1 · axial · 2.0mm · 0.45mm/px · z∈[-89,-9]mm · 4 of 95 slices shown (2 of 2)]
[im 1/95]
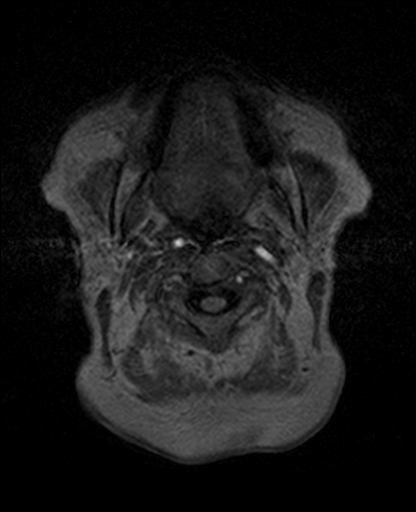
[im 14/95]
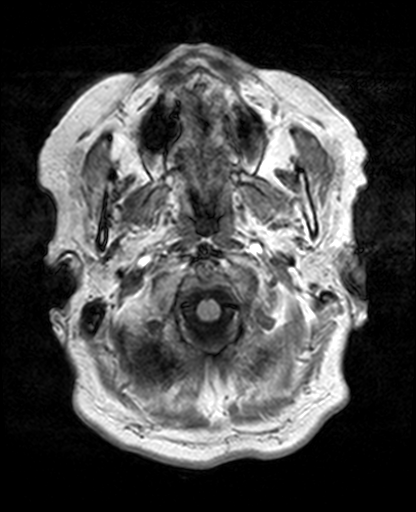
[im 27/95]
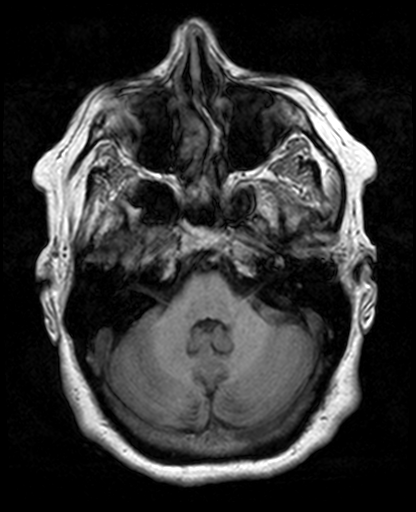
[im 41/95]
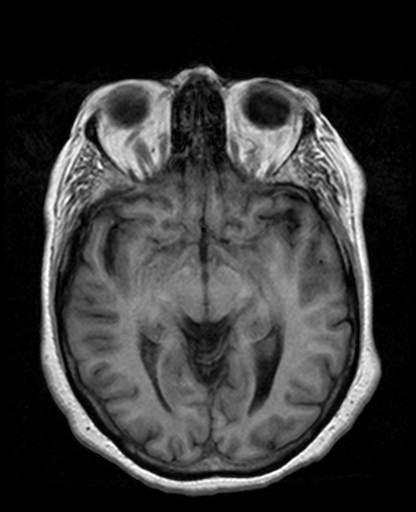

[Series 13: T2 · coronal · 5.0mm · 0.66mm/px · 2 of 28 slices shown (3 of 3)]
[im 1/28]
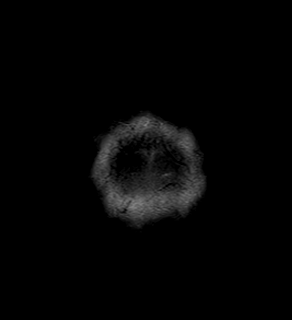
[im 28/28]
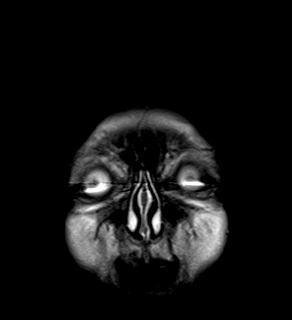

[Series 14: FLAIR · sagittal · 0.9mm · 0.90mm/px · 8 of 176 slices shown (2 of 2)]
[im 1/176]
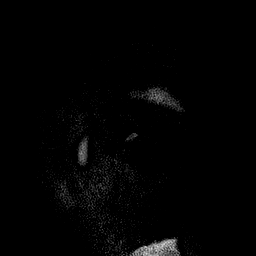
[im 27/176]
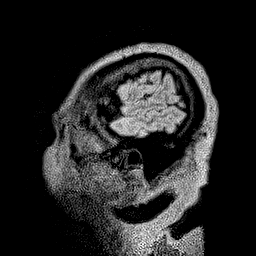
[im 54/176]
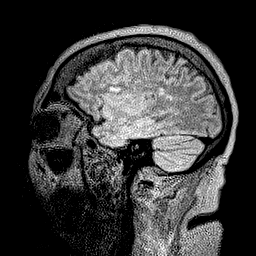
[im 81/176]
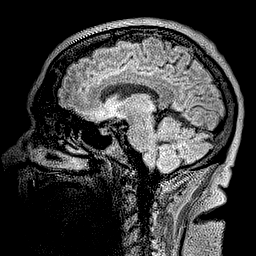
[im 95/176]
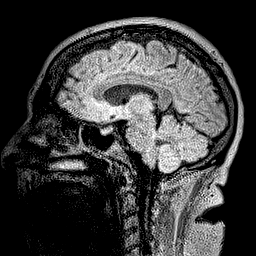
[im 122/176]
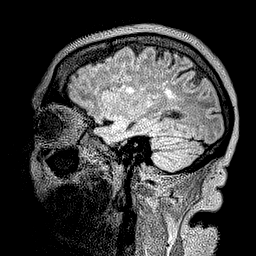
[im 149/176]
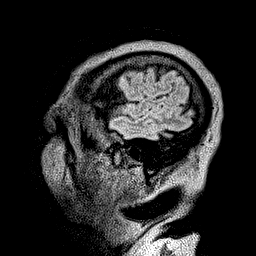
[im 176/176]
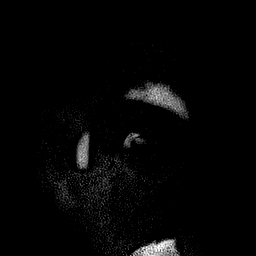

[31 of 48 positions shown; findings below may reference images not displayed]

FINDINGS: Brain: Ventricle size and cerebral volume normal for age. Negative
for acute infarct. Negative for hemorrhage or mass.

Scattered deep white matter hyperintensities bilaterally. Brainstem
and cerebellum normal.

Vascular: Normal arterial flow voids.

Skull and upper cervical spine: Negative

Sinuses/Orbits: Mild mucosal edema paranasal sinuses. Negative orbit

Other: None
IMPRESSION: No acute abnormality. Deep white matter hyperintensities consistent
with chronic microvascular ischemia. No cortical infarct.

## 2022-02-04 IMAGING — MR MR CERVICAL SPINE W/O CM
4 of 5 series · 14 of 48 positions shown · non-contrast
Comparison: CT cervical spine 01/20/2008

CLINICAL DATA: Neck pain left arm pain.  Cervical radiculopathy.

EXAM:
MRI CERVICAL SPINE WITHOUT CONTRAST
TECHNIQUE: Multiplanar, multisequence MR imaging of the cervical spine was
performed. No intravenous contrast was administered.

[Series 1: T2 · sagittal · 3.0mm · 0.41mm/px · 5 of 13 slices shown (1 of 2)]
[im 1/13]
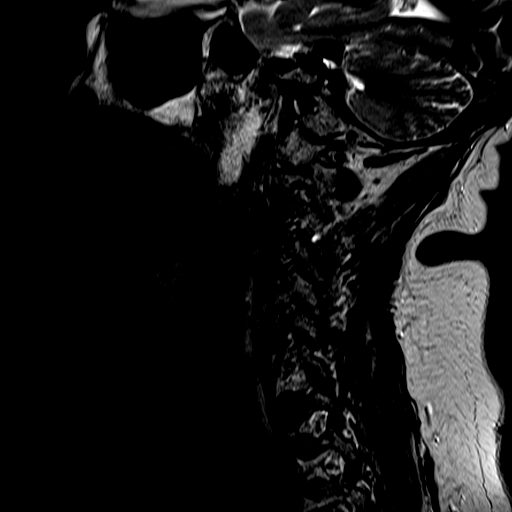
[im 4/13]
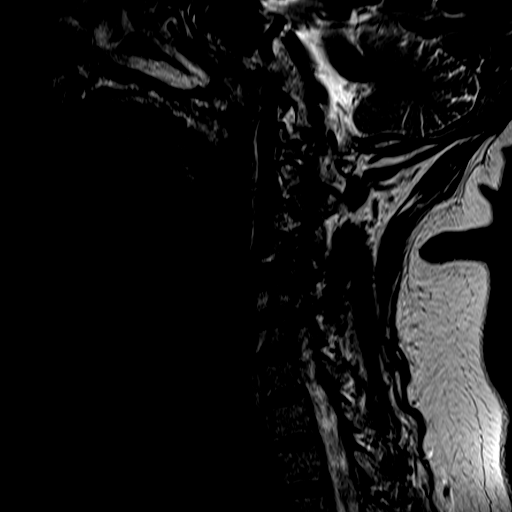
[im 7/13]
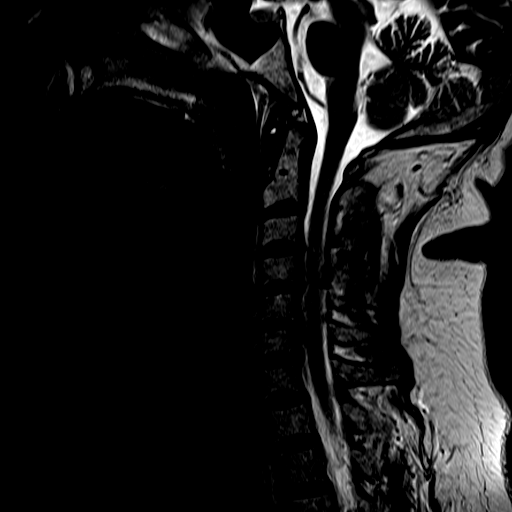
[im 10/13]
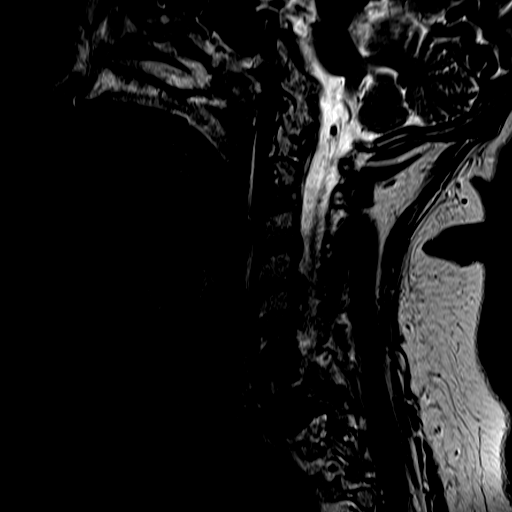
[im 13/13]
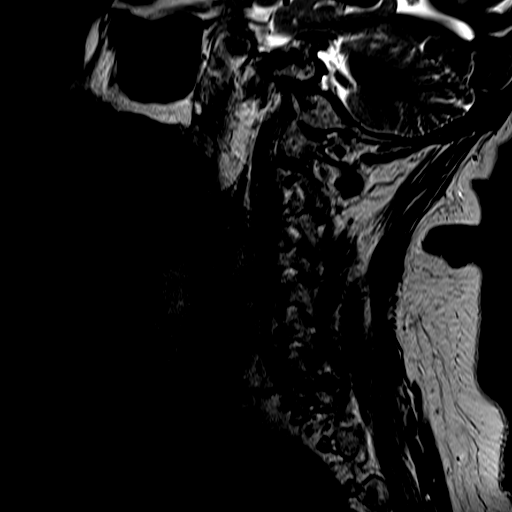

[Series 2: FLAIR · sagittal · 3.0mm · 0.47mm/px · 3 of 13 slices shown]
[im 1/13]
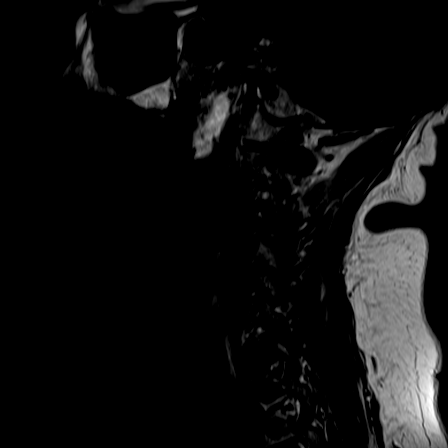
[im 7/13]
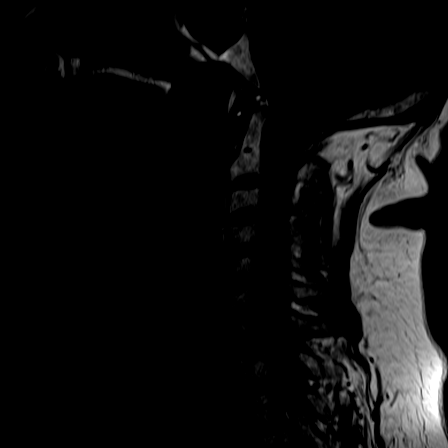
[im 13/13]
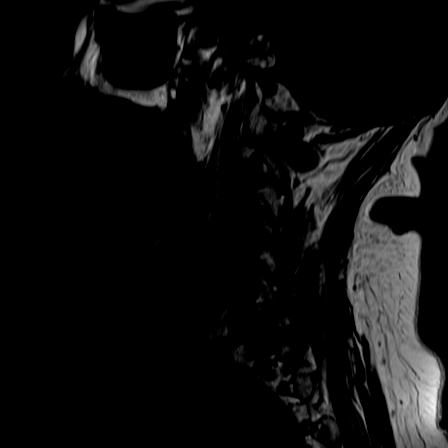

[Series 3: ir sagital · sagittal · 3.0mm · 0.23mm/px · 3 of 13 slices shown]
[im 3/13]
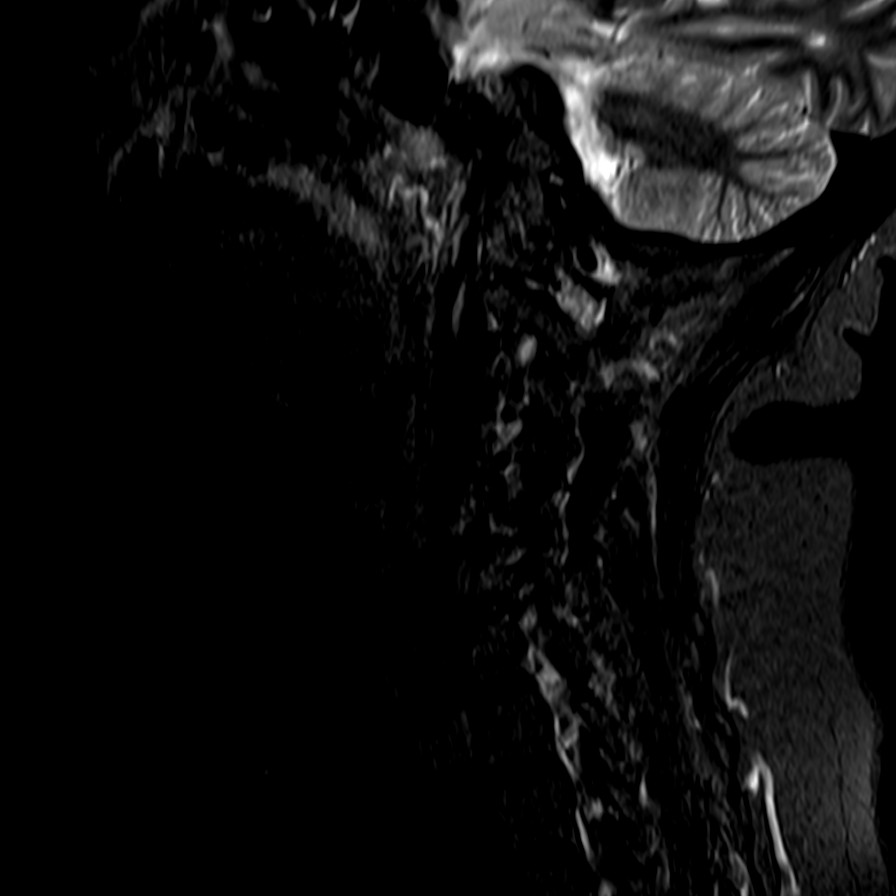
[im 8/13]
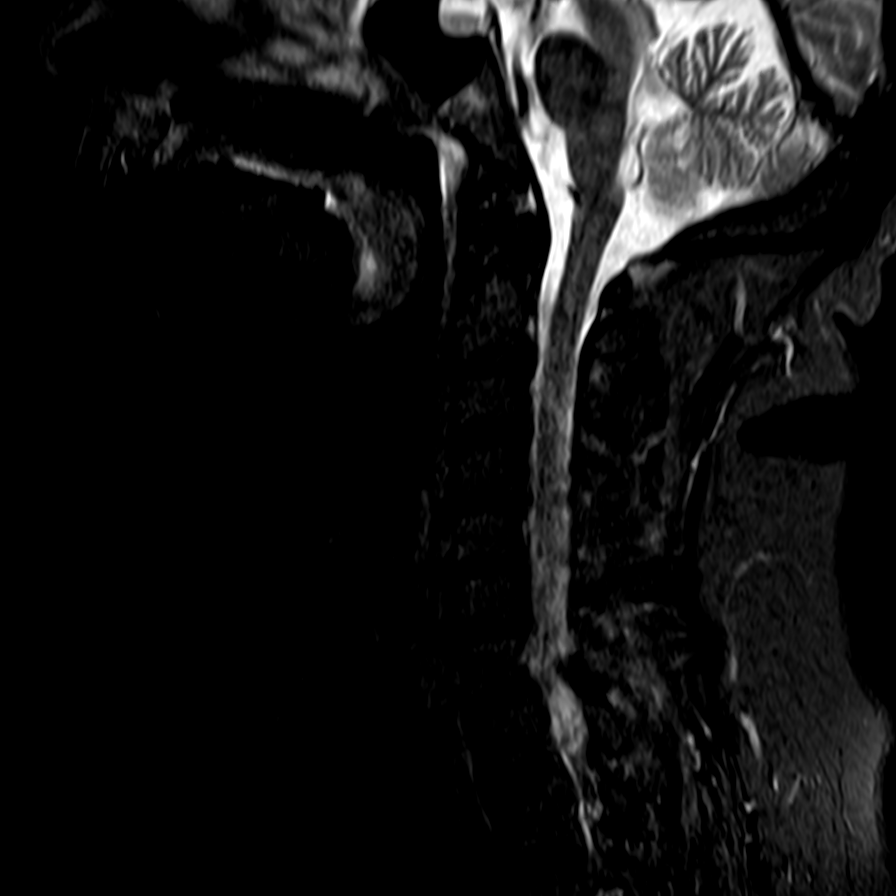
[im 13/13]
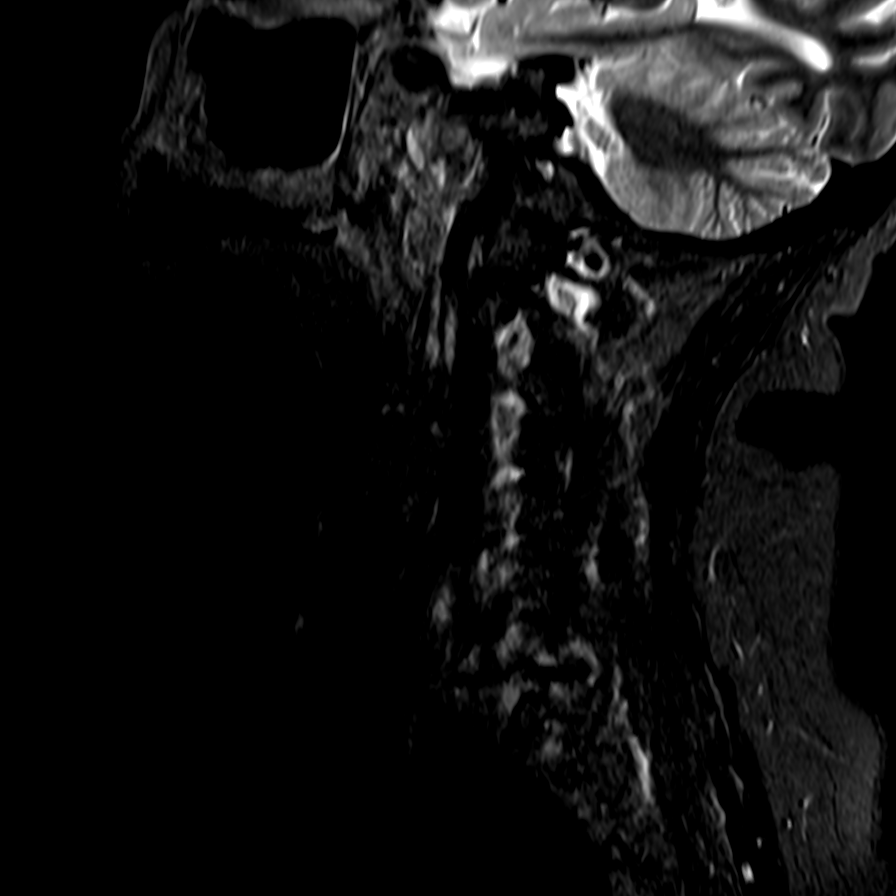

[Series 5: T2 · axial · 3.0mm · 0.21mm/px · z∈[-196,-115]mm · 3 of 36 slices shown (2 of 2)]
[im 5/36]
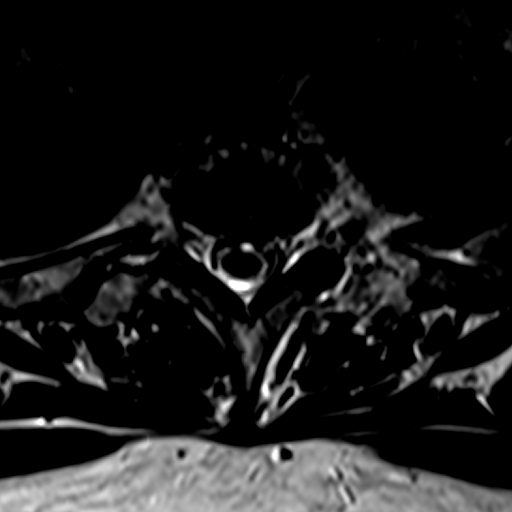
[im 19/36]
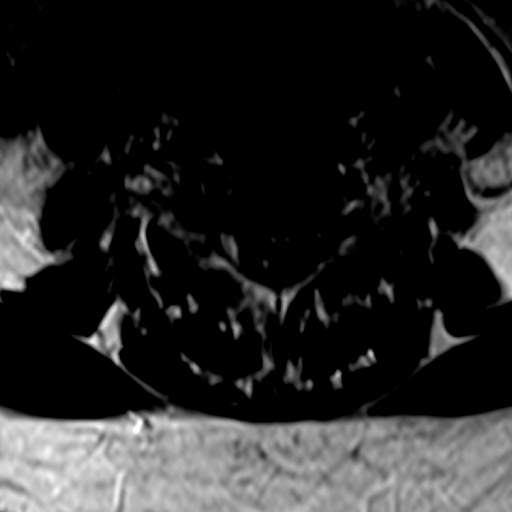
[im 31/36]
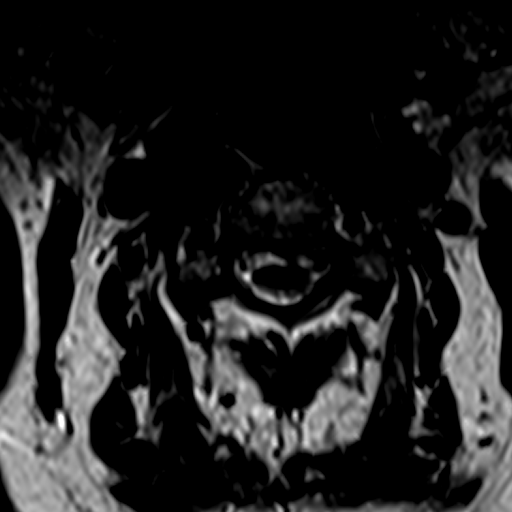

[14 of 48 positions shown; findings below may reference images not displayed]

FINDINGS: Alignment: 2 mm anterolisthesis C7-T1.  Normal cervical alignment.

Motion degraded study.

Vertebrae: Negative for fracture or mass.

Cord: Limited cord evaluation due to motion. No cord lesion
identified.

Posterior Fossa, vertebral arteries, paraspinal tissues: Negative

Disc levels:

C2-3: Small central disc protrusion without stenosis.

C3-4: Disc degeneration with diffuse uncinate spurring. Mild
foraminal narrowing bilaterally and mild spinal stenosis. Mild facet
degeneration on the right.

C4-5: Disc degeneration with diffuse uncinate spurring right greater
than left. Cord flattening with mild to moderate spinal stenosis.
Moderate foraminal stenosis bilaterally due to spurring.

C5-6: Disc degeneration and spondylosis. Diffuse uncinate spurring.
Mild spinal stenosis and moderate foraminal stenosis bilaterally

C6-7: Disc degeneration with diffuse uncinate spurring. Moderate
foraminal stenosis bilaterally

C7-T1: 2 mm anterolisthesis. Bilateral facet degeneration. Mild left
foraminal narrowing.
IMPRESSION: Motion degraded study

Multilevel degenerative change in spurring causing spinal and
foraminal stenosis bilaterally as described above.

## 2022-02-10 ENCOUNTER — Encounter (HOSPITAL_COMMUNITY): Payer: Self-pay | Admitting: *Deleted

## 2022-03-05 DIAGNOSIS — G894 Chronic pain syndrome: Secondary | ICD-10-CM | POA: Diagnosis not present

## 2022-03-05 DIAGNOSIS — J449 Chronic obstructive pulmonary disease, unspecified: Secondary | ICD-10-CM | POA: Diagnosis not present

## 2022-03-05 DIAGNOSIS — I10 Essential (primary) hypertension: Secondary | ICD-10-CM | POA: Diagnosis not present

## 2022-03-29 DIAGNOSIS — Z0001 Encounter for general adult medical examination with abnormal findings: Secondary | ICD-10-CM | POA: Diagnosis not present

## 2022-03-29 DIAGNOSIS — J449 Chronic obstructive pulmonary disease, unspecified: Secondary | ICD-10-CM | POA: Diagnosis not present

## 2022-03-29 DIAGNOSIS — E039 Hypothyroidism, unspecified: Secondary | ICD-10-CM | POA: Diagnosis not present

## 2022-03-29 DIAGNOSIS — G894 Chronic pain syndrome: Secondary | ICD-10-CM | POA: Diagnosis not present

## 2022-03-29 DIAGNOSIS — M48061 Spinal stenosis, lumbar region without neurogenic claudication: Secondary | ICD-10-CM | POA: Diagnosis not present

## 2022-03-29 DIAGNOSIS — E559 Vitamin D deficiency, unspecified: Secondary | ICD-10-CM | POA: Diagnosis not present

## 2022-03-29 DIAGNOSIS — D518 Other vitamin B12 deficiency anemias: Secondary | ICD-10-CM | POA: Diagnosis not present

## 2022-03-29 DIAGNOSIS — I10 Essential (primary) hypertension: Secondary | ICD-10-CM | POA: Diagnosis not present

## 2022-03-29 DIAGNOSIS — I700 Atherosclerosis of aorta: Secondary | ICD-10-CM | POA: Diagnosis not present

## 2022-04-06 DIAGNOSIS — M2140 Flat foot [pes planus] (acquired), unspecified foot: Secondary | ICD-10-CM | POA: Diagnosis not present

## 2022-04-06 DIAGNOSIS — E7849 Other hyperlipidemia: Secondary | ICD-10-CM | POA: Diagnosis not present

## 2022-04-06 DIAGNOSIS — M2011 Hallux valgus (acquired), right foot: Secondary | ICD-10-CM | POA: Diagnosis not present

## 2022-04-06 DIAGNOSIS — T8484XA Pain due to internal orthopedic prosthetic devices, implants and grafts, initial encounter: Secondary | ICD-10-CM | POA: Diagnosis not present

## 2022-04-06 DIAGNOSIS — M960 Pseudarthrosis after fusion or arthrodesis: Secondary | ICD-10-CM | POA: Diagnosis not present

## 2022-04-06 DIAGNOSIS — M79671 Pain in right foot: Secondary | ICD-10-CM | POA: Diagnosis not present

## 2022-04-29 DIAGNOSIS — G894 Chronic pain syndrome: Secondary | ICD-10-CM | POA: Diagnosis not present

## 2022-04-29 DIAGNOSIS — M48061 Spinal stenosis, lumbar region without neurogenic claudication: Secondary | ICD-10-CM | POA: Diagnosis not present

## 2022-04-29 DIAGNOSIS — J449 Chronic obstructive pulmonary disease, unspecified: Secondary | ICD-10-CM | POA: Diagnosis not present

## 2022-04-29 DIAGNOSIS — I10 Essential (primary) hypertension: Secondary | ICD-10-CM | POA: Diagnosis not present

## 2022-05-24 DIAGNOSIS — J449 Chronic obstructive pulmonary disease, unspecified: Secondary | ICD-10-CM | POA: Diagnosis not present

## 2022-05-24 DIAGNOSIS — M48061 Spinal stenosis, lumbar region without neurogenic claudication: Secondary | ICD-10-CM | POA: Diagnosis not present

## 2022-05-24 DIAGNOSIS — G894 Chronic pain syndrome: Secondary | ICD-10-CM | POA: Diagnosis not present

## 2022-05-24 DIAGNOSIS — I10 Essential (primary) hypertension: Secondary | ICD-10-CM | POA: Diagnosis not present

## 2022-06-24 DIAGNOSIS — G894 Chronic pain syndrome: Secondary | ICD-10-CM | POA: Diagnosis not present

## 2022-06-24 DIAGNOSIS — K219 Gastro-esophageal reflux disease without esophagitis: Secondary | ICD-10-CM | POA: Diagnosis not present

## 2022-06-24 DIAGNOSIS — I10 Essential (primary) hypertension: Secondary | ICD-10-CM | POA: Diagnosis not present

## 2022-06-24 DIAGNOSIS — I700 Atherosclerosis of aorta: Secondary | ICD-10-CM | POA: Diagnosis not present

## 2022-06-24 DIAGNOSIS — J449 Chronic obstructive pulmonary disease, unspecified: Secondary | ICD-10-CM | POA: Diagnosis not present

## 2022-06-24 DIAGNOSIS — Z23 Encounter for immunization: Secondary | ICD-10-CM | POA: Diagnosis not present

## 2022-06-24 DIAGNOSIS — M48061 Spinal stenosis, lumbar region without neurogenic claudication: Secondary | ICD-10-CM | POA: Diagnosis not present

## 2022-06-30 DIAGNOSIS — J069 Acute upper respiratory infection, unspecified: Secondary | ICD-10-CM | POA: Diagnosis not present

## 2022-07-23 DIAGNOSIS — I10 Essential (primary) hypertension: Secondary | ICD-10-CM | POA: Diagnosis not present

## 2022-07-23 DIAGNOSIS — G894 Chronic pain syndrome: Secondary | ICD-10-CM | POA: Diagnosis not present

## 2022-07-23 DIAGNOSIS — M48061 Spinal stenosis, lumbar region without neurogenic claudication: Secondary | ICD-10-CM | POA: Diagnosis not present

## 2022-07-23 DIAGNOSIS — J449 Chronic obstructive pulmonary disease, unspecified: Secondary | ICD-10-CM | POA: Diagnosis not present

## 2022-09-01 DIAGNOSIS — G894 Chronic pain syndrome: Secondary | ICD-10-CM | POA: Diagnosis not present

## 2022-09-01 DIAGNOSIS — I700 Atherosclerosis of aorta: Secondary | ICD-10-CM | POA: Diagnosis not present

## 2022-09-01 DIAGNOSIS — J449 Chronic obstructive pulmonary disease, unspecified: Secondary | ICD-10-CM | POA: Diagnosis not present

## 2022-09-01 DIAGNOSIS — M48061 Spinal stenosis, lumbar region without neurogenic claudication: Secondary | ICD-10-CM | POA: Diagnosis not present

## 2022-09-01 DIAGNOSIS — I10 Essential (primary) hypertension: Secondary | ICD-10-CM | POA: Diagnosis not present

## 2022-09-02 DIAGNOSIS — M960 Pseudarthrosis after fusion or arthrodesis: Secondary | ICD-10-CM | POA: Diagnosis not present

## 2022-09-02 DIAGNOSIS — M2011 Hallux valgus (acquired), right foot: Secondary | ICD-10-CM | POA: Diagnosis not present

## 2022-09-02 DIAGNOSIS — T8484XA Pain due to internal orthopedic prosthetic devices, implants and grafts, initial encounter: Secondary | ICD-10-CM | POA: Diagnosis not present

## 2022-09-06 DIAGNOSIS — G894 Chronic pain syndrome: Secondary | ICD-10-CM | POA: Diagnosis not present

## 2022-09-06 DIAGNOSIS — M48061 Spinal stenosis, lumbar region without neurogenic claudication: Secondary | ICD-10-CM | POA: Diagnosis not present

## 2022-09-06 DIAGNOSIS — I10 Essential (primary) hypertension: Secondary | ICD-10-CM | POA: Diagnosis not present

## 2022-09-10 DIAGNOSIS — Z9884 Bariatric surgery status: Secondary | ICD-10-CM | POA: Diagnosis not present

## 2022-09-10 DIAGNOSIS — M25571 Pain in right ankle and joints of right foot: Secondary | ICD-10-CM | POA: Diagnosis not present

## 2022-09-10 DIAGNOSIS — M2041 Other hammer toe(s) (acquired), right foot: Secondary | ICD-10-CM | POA: Diagnosis not present

## 2022-09-10 DIAGNOSIS — M13871 Other specified arthritis, right ankle and foot: Secondary | ICD-10-CM | POA: Diagnosis not present

## 2022-09-10 DIAGNOSIS — I10 Essential (primary) hypertension: Secondary | ICD-10-CM | POA: Diagnosis not present

## 2022-09-10 DIAGNOSIS — Z9049 Acquired absence of other specified parts of digestive tract: Secondary | ICD-10-CM | POA: Diagnosis not present

## 2022-09-10 DIAGNOSIS — E785 Hyperlipidemia, unspecified: Secondary | ICD-10-CM | POA: Diagnosis not present

## 2022-09-10 DIAGNOSIS — T8484XA Pain due to internal orthopedic prosthetic devices, implants and grafts, initial encounter: Secondary | ICD-10-CM | POA: Diagnosis not present

## 2022-09-10 DIAGNOSIS — Z79899 Other long term (current) drug therapy: Secondary | ICD-10-CM | POA: Diagnosis not present

## 2022-09-10 DIAGNOSIS — M960 Pseudarthrosis after fusion or arthrodesis: Secondary | ICD-10-CM | POA: Diagnosis not present

## 2022-09-10 DIAGNOSIS — G8918 Other acute postprocedural pain: Secondary | ICD-10-CM | POA: Diagnosis not present

## 2022-09-10 DIAGNOSIS — Z87891 Personal history of nicotine dependence: Secondary | ICD-10-CM | POA: Diagnosis not present

## 2022-09-10 DIAGNOSIS — M2011 Hallux valgus (acquired), right foot: Secondary | ICD-10-CM | POA: Diagnosis not present

## 2022-09-10 DIAGNOSIS — X58XXXA Exposure to other specified factors, initial encounter: Secondary | ICD-10-CM | POA: Diagnosis not present

## 2022-09-10 DIAGNOSIS — M19071 Primary osteoarthritis, right ankle and foot: Secondary | ICD-10-CM | POA: Diagnosis not present

## 2022-09-10 DIAGNOSIS — Z7982 Long term (current) use of aspirin: Secondary | ICD-10-CM | POA: Diagnosis not present

## 2022-09-27 DIAGNOSIS — G894 Chronic pain syndrome: Secondary | ICD-10-CM | POA: Diagnosis not present

## 2022-09-27 DIAGNOSIS — I700 Atherosclerosis of aorta: Secondary | ICD-10-CM | POA: Diagnosis not present

## 2022-09-27 DIAGNOSIS — J449 Chronic obstructive pulmonary disease, unspecified: Secondary | ICD-10-CM | POA: Diagnosis not present

## 2022-09-28 DIAGNOSIS — M79671 Pain in right foot: Secondary | ICD-10-CM | POA: Diagnosis not present

## 2022-10-04 DIAGNOSIS — I10 Essential (primary) hypertension: Secondary | ICD-10-CM | POA: Diagnosis not present

## 2022-10-19 DIAGNOSIS — M79671 Pain in right foot: Secondary | ICD-10-CM | POA: Diagnosis not present

## 2022-10-25 DIAGNOSIS — G894 Chronic pain syndrome: Secondary | ICD-10-CM | POA: Diagnosis not present

## 2022-10-25 DIAGNOSIS — J449 Chronic obstructive pulmonary disease, unspecified: Secondary | ICD-10-CM | POA: Diagnosis not present

## 2022-10-25 DIAGNOSIS — I700 Atherosclerosis of aorta: Secondary | ICD-10-CM | POA: Diagnosis not present

## 2022-10-25 DIAGNOSIS — M48061 Spinal stenosis, lumbar region without neurogenic claudication: Secondary | ICD-10-CM | POA: Diagnosis not present

## 2022-11-16 DIAGNOSIS — M79671 Pain in right foot: Secondary | ICD-10-CM | POA: Diagnosis not present

## 2022-11-25 DIAGNOSIS — J449 Chronic obstructive pulmonary disease, unspecified: Secondary | ICD-10-CM | POA: Diagnosis not present

## 2022-11-25 DIAGNOSIS — I700 Atherosclerosis of aorta: Secondary | ICD-10-CM | POA: Diagnosis not present

## 2022-11-25 DIAGNOSIS — M48061 Spinal stenosis, lumbar region without neurogenic claudication: Secondary | ICD-10-CM | POA: Diagnosis not present

## 2022-11-25 DIAGNOSIS — I10 Essential (primary) hypertension: Secondary | ICD-10-CM | POA: Diagnosis not present

## 2022-11-25 DIAGNOSIS — E782 Mixed hyperlipidemia: Secondary | ICD-10-CM | POA: Diagnosis not present

## 2022-11-25 DIAGNOSIS — G894 Chronic pain syndrome: Secondary | ICD-10-CM | POA: Diagnosis not present

## 2022-12-21 DIAGNOSIS — M79671 Pain in right foot: Secondary | ICD-10-CM | POA: Diagnosis not present

## 2022-12-21 DIAGNOSIS — E782 Mixed hyperlipidemia: Secondary | ICD-10-CM | POA: Diagnosis not present

## 2022-12-21 DIAGNOSIS — I10 Essential (primary) hypertension: Secondary | ICD-10-CM | POA: Diagnosis not present

## 2022-12-24 DIAGNOSIS — J449 Chronic obstructive pulmonary disease, unspecified: Secondary | ICD-10-CM | POA: Diagnosis not present

## 2022-12-24 DIAGNOSIS — G894 Chronic pain syndrome: Secondary | ICD-10-CM | POA: Diagnosis not present

## 2022-12-24 DIAGNOSIS — M48061 Spinal stenosis, lumbar region without neurogenic claudication: Secondary | ICD-10-CM | POA: Diagnosis not present

## 2022-12-24 DIAGNOSIS — I10 Essential (primary) hypertension: Secondary | ICD-10-CM | POA: Diagnosis not present

## 2023-01-21 DIAGNOSIS — I10 Essential (primary) hypertension: Secondary | ICD-10-CM | POA: Diagnosis not present

## 2023-01-21 DIAGNOSIS — E782 Mixed hyperlipidemia: Secondary | ICD-10-CM | POA: Diagnosis not present

## 2023-01-21 DIAGNOSIS — J449 Chronic obstructive pulmonary disease, unspecified: Secondary | ICD-10-CM | POA: Diagnosis not present

## 2023-01-21 DIAGNOSIS — E039 Hypothyroidism, unspecified: Secondary | ICD-10-CM | POA: Diagnosis not present

## 2023-01-21 DIAGNOSIS — I700 Atherosclerosis of aorta: Secondary | ICD-10-CM | POA: Diagnosis not present

## 2023-01-21 DIAGNOSIS — G894 Chronic pain syndrome: Secondary | ICD-10-CM | POA: Diagnosis not present

## 2023-01-24 ENCOUNTER — Other Ambulatory Visit (HOSPITAL_COMMUNITY): Payer: Self-pay | Admitting: Internal Medicine

## 2023-01-24 DIAGNOSIS — Z1231 Encounter for screening mammogram for malignant neoplasm of breast: Secondary | ICD-10-CM

## 2023-01-26 DIAGNOSIS — L700 Acne vulgaris: Secondary | ICD-10-CM | POA: Diagnosis not present

## 2023-01-26 DIAGNOSIS — D485 Neoplasm of uncertain behavior of skin: Secondary | ICD-10-CM | POA: Diagnosis not present

## 2023-01-26 DIAGNOSIS — L814 Other melanin hyperpigmentation: Secondary | ICD-10-CM | POA: Diagnosis not present

## 2023-01-26 DIAGNOSIS — C44329 Squamous cell carcinoma of skin of other parts of face: Secondary | ICD-10-CM | POA: Diagnosis not present

## 2023-01-26 DIAGNOSIS — Z7189 Other specified counseling: Secondary | ICD-10-CM | POA: Diagnosis not present

## 2023-02-23 ENCOUNTER — Ambulatory Visit (HOSPITAL_COMMUNITY)
Admission: RE | Admit: 2023-02-23 | Discharge: 2023-02-23 | Disposition: A | Discharge status: Home / Self Care | Payer: Medicare Other | Source: Ambulatory Visit | Attending: Internal Medicine | Admitting: Internal Medicine

## 2023-02-23 ENCOUNTER — Encounter (HOSPITAL_COMMUNITY): Payer: Self-pay

## 2023-02-23 DIAGNOSIS — E782 Mixed hyperlipidemia: Secondary | ICD-10-CM | POA: Diagnosis not present

## 2023-02-23 DIAGNOSIS — I700 Atherosclerosis of aorta: Secondary | ICD-10-CM | POA: Diagnosis not present

## 2023-02-23 DIAGNOSIS — Z1231 Encounter for screening mammogram for malignant neoplasm of breast: Secondary | ICD-10-CM | POA: Insufficient documentation

## 2023-02-23 DIAGNOSIS — I10 Essential (primary) hypertension: Secondary | ICD-10-CM | POA: Diagnosis not present

## 2023-02-23 DIAGNOSIS — M48061 Spinal stenosis, lumbar region without neurogenic claudication: Secondary | ICD-10-CM | POA: Diagnosis not present

## 2023-02-23 DIAGNOSIS — J449 Chronic obstructive pulmonary disease, unspecified: Secondary | ICD-10-CM | POA: Diagnosis not present

## 2023-02-23 DIAGNOSIS — Z0001 Encounter for general adult medical examination with abnormal findings: Secondary | ICD-10-CM | POA: Diagnosis not present

## 2023-02-23 DIAGNOSIS — G894 Chronic pain syndrome: Secondary | ICD-10-CM | POA: Diagnosis not present

## 2023-03-08 ENCOUNTER — Encounter (HOSPITAL_COMMUNITY): Payer: Self-pay | Admitting: *Deleted

## 2023-03-15 DIAGNOSIS — C44329 Squamous cell carcinoma of skin of other parts of face: Secondary | ICD-10-CM | POA: Diagnosis not present

## 2023-03-25 DIAGNOSIS — G894 Chronic pain syndrome: Secondary | ICD-10-CM | POA: Diagnosis not present

## 2023-03-25 DIAGNOSIS — J449 Chronic obstructive pulmonary disease, unspecified: Secondary | ICD-10-CM | POA: Diagnosis not present

## 2023-03-25 DIAGNOSIS — M48061 Spinal stenosis, lumbar region without neurogenic claudication: Secondary | ICD-10-CM | POA: Diagnosis not present

## 2023-03-25 DIAGNOSIS — I10 Essential (primary) hypertension: Secondary | ICD-10-CM | POA: Diagnosis not present

## 2023-04-04 DIAGNOSIS — E7849 Other hyperlipidemia: Secondary | ICD-10-CM | POA: Diagnosis not present

## 2023-04-04 DIAGNOSIS — E039 Hypothyroidism, unspecified: Secondary | ICD-10-CM | POA: Diagnosis not present

## 2023-04-04 DIAGNOSIS — E559 Vitamin D deficiency, unspecified: Secondary | ICD-10-CM | POA: Diagnosis not present

## 2023-04-04 DIAGNOSIS — J449 Chronic obstructive pulmonary disease, unspecified: Secondary | ICD-10-CM | POA: Diagnosis not present

## 2023-04-04 DIAGNOSIS — D518 Other vitamin B12 deficiency anemias: Secondary | ICD-10-CM | POA: Diagnosis not present

## 2023-04-04 DIAGNOSIS — I10 Essential (primary) hypertension: Secondary | ICD-10-CM | POA: Diagnosis not present

## 2023-04-26 DIAGNOSIS — I10 Essential (primary) hypertension: Secondary | ICD-10-CM | POA: Diagnosis not present

## 2023-04-26 DIAGNOSIS — G894 Chronic pain syndrome: Secondary | ICD-10-CM | POA: Diagnosis not present

## 2023-04-26 DIAGNOSIS — M48061 Spinal stenosis, lumbar region without neurogenic claudication: Secondary | ICD-10-CM | POA: Diagnosis not present

## 2023-05-09 DIAGNOSIS — B372 Candidiasis of skin and nail: Secondary | ICD-10-CM | POA: Diagnosis not present

## 2023-05-09 DIAGNOSIS — R21 Rash and other nonspecific skin eruption: Secondary | ICD-10-CM | POA: Diagnosis not present

## 2023-05-11 ENCOUNTER — Encounter: Payer: Self-pay | Admitting: *Deleted

## 2023-05-24 DIAGNOSIS — J449 Chronic obstructive pulmonary disease, unspecified: Secondary | ICD-10-CM | POA: Diagnosis not present

## 2023-05-24 DIAGNOSIS — M48061 Spinal stenosis, lumbar region without neurogenic claudication: Secondary | ICD-10-CM | POA: Diagnosis not present

## 2023-05-24 DIAGNOSIS — E782 Mixed hyperlipidemia: Secondary | ICD-10-CM | POA: Diagnosis not present

## 2023-05-24 DIAGNOSIS — I10 Essential (primary) hypertension: Secondary | ICD-10-CM | POA: Diagnosis not present

## 2023-05-24 DIAGNOSIS — Z9229 Personal history of other drug therapy: Secondary | ICD-10-CM | POA: Diagnosis not present

## 2023-06-17 DIAGNOSIS — J449 Chronic obstructive pulmonary disease, unspecified: Secondary | ICD-10-CM | POA: Diagnosis not present

## 2023-06-17 DIAGNOSIS — G894 Chronic pain syndrome: Secondary | ICD-10-CM | POA: Diagnosis not present

## 2023-06-17 DIAGNOSIS — I10 Essential (primary) hypertension: Secondary | ICD-10-CM | POA: Diagnosis not present

## 2023-06-17 DIAGNOSIS — M48061 Spinal stenosis, lumbar region without neurogenic claudication: Secondary | ICD-10-CM | POA: Diagnosis not present

## 2023-06-23 DIAGNOSIS — I10 Essential (primary) hypertension: Secondary | ICD-10-CM | POA: Diagnosis not present

## 2023-06-23 DIAGNOSIS — I700 Atherosclerosis of aorta: Secondary | ICD-10-CM | POA: Diagnosis not present

## 2023-07-15 DIAGNOSIS — J449 Chronic obstructive pulmonary disease, unspecified: Secondary | ICD-10-CM | POA: Diagnosis not present

## 2023-07-15 DIAGNOSIS — I10 Essential (primary) hypertension: Secondary | ICD-10-CM | POA: Diagnosis not present

## 2023-07-15 DIAGNOSIS — M48061 Spinal stenosis, lumbar region without neurogenic claudication: Secondary | ICD-10-CM | POA: Diagnosis not present

## 2023-07-15 DIAGNOSIS — G894 Chronic pain syndrome: Secondary | ICD-10-CM | POA: Diagnosis not present

## 2023-08-18 DIAGNOSIS — I10 Essential (primary) hypertension: Secondary | ICD-10-CM | POA: Diagnosis not present

## 2023-08-18 DIAGNOSIS — M48061 Spinal stenosis, lumbar region without neurogenic claudication: Secondary | ICD-10-CM | POA: Diagnosis not present

## 2023-08-18 DIAGNOSIS — Z6823 Body mass index (BMI) 23.0-23.9, adult: Secondary | ICD-10-CM | POA: Diagnosis not present

## 2023-08-18 DIAGNOSIS — J449 Chronic obstructive pulmonary disease, unspecified: Secondary | ICD-10-CM | POA: Diagnosis not present

## 2023-08-18 DIAGNOSIS — G894 Chronic pain syndrome: Secondary | ICD-10-CM | POA: Diagnosis not present

## 2023-08-23 DIAGNOSIS — E782 Mixed hyperlipidemia: Secondary | ICD-10-CM | POA: Diagnosis not present

## 2023-08-23 DIAGNOSIS — I10 Essential (primary) hypertension: Secondary | ICD-10-CM | POA: Diagnosis not present

## 2023-08-23 DIAGNOSIS — I700 Atherosclerosis of aorta: Secondary | ICD-10-CM | POA: Diagnosis not present

## 2023-08-26 DIAGNOSIS — U071 COVID-19: Secondary | ICD-10-CM | POA: Diagnosis not present

## 2023-09-12 DIAGNOSIS — J449 Chronic obstructive pulmonary disease, unspecified: Secondary | ICD-10-CM | POA: Diagnosis not present

## 2023-09-12 DIAGNOSIS — I10 Essential (primary) hypertension: Secondary | ICD-10-CM | POA: Diagnosis not present

## 2023-09-12 DIAGNOSIS — G894 Chronic pain syndrome: Secondary | ICD-10-CM | POA: Diagnosis not present

## 2023-09-12 DIAGNOSIS — M48061 Spinal stenosis, lumbar region without neurogenic claudication: Secondary | ICD-10-CM | POA: Diagnosis not present

## 2023-09-28 DIAGNOSIS — L821 Other seborrheic keratosis: Secondary | ICD-10-CM | POA: Diagnosis not present

## 2023-09-28 DIAGNOSIS — L853 Xerosis cutis: Secondary | ICD-10-CM | POA: Diagnosis not present

## 2023-09-28 DIAGNOSIS — L814 Other melanin hyperpigmentation: Secondary | ICD-10-CM | POA: Diagnosis not present

## 2023-09-28 DIAGNOSIS — Z85828 Personal history of other malignant neoplasm of skin: Secondary | ICD-10-CM | POA: Diagnosis not present

## 2023-09-28 DIAGNOSIS — B351 Tinea unguium: Secondary | ICD-10-CM | POA: Diagnosis not present

## 2023-09-28 DIAGNOSIS — D1801 Hemangioma of skin and subcutaneous tissue: Secondary | ICD-10-CM | POA: Diagnosis not present

## 2023-09-28 DIAGNOSIS — L570 Actinic keratosis: Secondary | ICD-10-CM | POA: Diagnosis not present

## 2023-09-28 DIAGNOSIS — I8393 Asymptomatic varicose veins of bilateral lower extremities: Secondary | ICD-10-CM | POA: Diagnosis not present

## 2023-09-28 DIAGNOSIS — D692 Other nonthrombocytopenic purpura: Secondary | ICD-10-CM | POA: Diagnosis not present

## 2023-09-28 DIAGNOSIS — Z08 Encounter for follow-up examination after completed treatment for malignant neoplasm: Secondary | ICD-10-CM | POA: Diagnosis not present

## 2023-10-11 DIAGNOSIS — I10 Essential (primary) hypertension: Secondary | ICD-10-CM | POA: Diagnosis not present

## 2023-10-11 DIAGNOSIS — J449 Chronic obstructive pulmonary disease, unspecified: Secondary | ICD-10-CM | POA: Diagnosis not present

## 2023-10-11 DIAGNOSIS — G894 Chronic pain syndrome: Secondary | ICD-10-CM | POA: Diagnosis not present

## 2023-10-11 DIAGNOSIS — M48061 Spinal stenosis, lumbar region without neurogenic claudication: Secondary | ICD-10-CM | POA: Diagnosis not present

## 2023-10-11 DIAGNOSIS — R232 Flushing: Secondary | ICD-10-CM | POA: Diagnosis not present

## 2023-10-26 DIAGNOSIS — N390 Urinary tract infection, site not specified: Secondary | ICD-10-CM | POA: Diagnosis not present

## 2023-10-26 DIAGNOSIS — M48061 Spinal stenosis, lumbar region without neurogenic claudication: Secondary | ICD-10-CM | POA: Diagnosis not present

## 2023-10-26 DIAGNOSIS — J449 Chronic obstructive pulmonary disease, unspecified: Secondary | ICD-10-CM | POA: Diagnosis not present

## 2023-10-26 DIAGNOSIS — G894 Chronic pain syndrome: Secondary | ICD-10-CM | POA: Diagnosis not present

## 2023-10-26 DIAGNOSIS — I10 Essential (primary) hypertension: Secondary | ICD-10-CM | POA: Diagnosis not present

## 2023-11-28 DIAGNOSIS — Z6823 Body mass index (BMI) 23.0-23.9, adult: Secondary | ICD-10-CM | POA: Diagnosis not present

## 2023-11-28 DIAGNOSIS — I10 Essential (primary) hypertension: Secondary | ICD-10-CM | POA: Diagnosis not present

## 2023-11-28 DIAGNOSIS — J449 Chronic obstructive pulmonary disease, unspecified: Secondary | ICD-10-CM | POA: Diagnosis not present

## 2023-11-28 DIAGNOSIS — G894 Chronic pain syndrome: Secondary | ICD-10-CM | POA: Diagnosis not present

## 2023-11-28 DIAGNOSIS — M48061 Spinal stenosis, lumbar region without neurogenic claudication: Secondary | ICD-10-CM | POA: Diagnosis not present

## 2023-12-21 DIAGNOSIS — I700 Atherosclerosis of aorta: Secondary | ICD-10-CM | POA: Diagnosis not present

## 2023-12-21 DIAGNOSIS — I10 Essential (primary) hypertension: Secondary | ICD-10-CM | POA: Diagnosis not present

## 2023-12-21 DIAGNOSIS — E782 Mixed hyperlipidemia: Secondary | ICD-10-CM | POA: Diagnosis not present

## 2023-12-26 DIAGNOSIS — I10 Essential (primary) hypertension: Secondary | ICD-10-CM | POA: Diagnosis not present

## 2023-12-26 DIAGNOSIS — J449 Chronic obstructive pulmonary disease, unspecified: Secondary | ICD-10-CM | POA: Diagnosis not present

## 2023-12-26 DIAGNOSIS — M48061 Spinal stenosis, lumbar region without neurogenic claudication: Secondary | ICD-10-CM | POA: Diagnosis not present

## 2023-12-26 DIAGNOSIS — G894 Chronic pain syndrome: Secondary | ICD-10-CM | POA: Diagnosis not present

## 2024-01-30 DIAGNOSIS — I10 Essential (primary) hypertension: Secondary | ICD-10-CM | POA: Diagnosis not present

## 2024-01-30 DIAGNOSIS — M48061 Spinal stenosis, lumbar region without neurogenic claudication: Secondary | ICD-10-CM | POA: Diagnosis not present

## 2024-01-30 DIAGNOSIS — G894 Chronic pain syndrome: Secondary | ICD-10-CM | POA: Diagnosis not present

## 2024-01-30 DIAGNOSIS — J449 Chronic obstructive pulmonary disease, unspecified: Secondary | ICD-10-CM | POA: Diagnosis not present

## 2024-02-20 DIAGNOSIS — I700 Atherosclerosis of aorta: Secondary | ICD-10-CM | POA: Diagnosis not present

## 2024-02-20 DIAGNOSIS — I10 Essential (primary) hypertension: Secondary | ICD-10-CM | POA: Diagnosis not present

## 2024-02-27 DIAGNOSIS — Z0001 Encounter for general adult medical examination with abnormal findings: Secondary | ICD-10-CM | POA: Diagnosis not present

## 2024-02-27 DIAGNOSIS — M48061 Spinal stenosis, lumbar region without neurogenic claudication: Secondary | ICD-10-CM | POA: Diagnosis not present

## 2024-02-27 DIAGNOSIS — G894 Chronic pain syndrome: Secondary | ICD-10-CM | POA: Diagnosis not present

## 2024-02-27 DIAGNOSIS — E559 Vitamin D deficiency, unspecified: Secondary | ICD-10-CM | POA: Diagnosis not present

## 2024-02-27 DIAGNOSIS — I10 Essential (primary) hypertension: Secondary | ICD-10-CM | POA: Diagnosis not present

## 2024-02-27 DIAGNOSIS — J449 Chronic obstructive pulmonary disease, unspecified: Secondary | ICD-10-CM | POA: Diagnosis not present

## 2024-02-27 DIAGNOSIS — Z9229 Personal history of other drug therapy: Secondary | ICD-10-CM | POA: Diagnosis not present

## 2024-02-27 DIAGNOSIS — E7849 Other hyperlipidemia: Secondary | ICD-10-CM | POA: Diagnosis not present

## 2024-02-27 DIAGNOSIS — Z6824 Body mass index (BMI) 24.0-24.9, adult: Secondary | ICD-10-CM | POA: Diagnosis not present

## 2024-02-27 DIAGNOSIS — E039 Hypothyroidism, unspecified: Secondary | ICD-10-CM | POA: Diagnosis not present

## 2024-03-12 DIAGNOSIS — G894 Chronic pain syndrome: Secondary | ICD-10-CM | POA: Diagnosis not present

## 2024-03-12 DIAGNOSIS — G629 Polyneuropathy, unspecified: Secondary | ICD-10-CM | POA: Diagnosis not present

## 2024-03-12 DIAGNOSIS — I10 Essential (primary) hypertension: Secondary | ICD-10-CM | POA: Diagnosis not present

## 2024-03-26 DIAGNOSIS — J449 Chronic obstructive pulmonary disease, unspecified: Secondary | ICD-10-CM | POA: Diagnosis not present

## 2024-03-26 DIAGNOSIS — G894 Chronic pain syndrome: Secondary | ICD-10-CM | POA: Diagnosis not present

## 2024-03-26 DIAGNOSIS — M48061 Spinal stenosis, lumbar region without neurogenic claudication: Secondary | ICD-10-CM | POA: Diagnosis not present

## 2024-03-26 DIAGNOSIS — I10 Essential (primary) hypertension: Secondary | ICD-10-CM | POA: Diagnosis not present

## 2024-04-03 DIAGNOSIS — L821 Other seborrheic keratosis: Secondary | ICD-10-CM | POA: Diagnosis not present

## 2024-04-03 DIAGNOSIS — X32XXXA Exposure to sunlight, initial encounter: Secondary | ICD-10-CM | POA: Diagnosis not present

## 2024-04-03 DIAGNOSIS — L570 Actinic keratosis: Secondary | ICD-10-CM | POA: Diagnosis not present

## 2024-04-03 DIAGNOSIS — Z08 Encounter for follow-up examination after completed treatment for malignant neoplasm: Secondary | ICD-10-CM | POA: Diagnosis not present

## 2024-04-03 DIAGNOSIS — Z7189 Other specified counseling: Secondary | ICD-10-CM | POA: Diagnosis not present

## 2024-04-03 DIAGNOSIS — Z85828 Personal history of other malignant neoplasm of skin: Secondary | ICD-10-CM | POA: Diagnosis not present

## 2024-04-03 DIAGNOSIS — D225 Melanocytic nevi of trunk: Secondary | ICD-10-CM | POA: Diagnosis not present

## 2024-04-03 DIAGNOSIS — L578 Other skin changes due to chronic exposure to nonionizing radiation: Secondary | ICD-10-CM | POA: Diagnosis not present

## 2024-04-03 DIAGNOSIS — L814 Other melanin hyperpigmentation: Secondary | ICD-10-CM | POA: Diagnosis not present

## 2024-04-17 ENCOUNTER — Other Ambulatory Visit (HOSPITAL_COMMUNITY): Payer: Self-pay | Admitting: Internal Medicine

## 2024-04-17 DIAGNOSIS — Z1231 Encounter for screening mammogram for malignant neoplasm of breast: Secondary | ICD-10-CM

## 2024-05-02 ENCOUNTER — Ambulatory Visit (HOSPITAL_COMMUNITY)
Admission: RE | Admit: 2024-05-02 | Discharge: 2024-05-02 | Disposition: A | Discharge status: Home / Self Care | Source: Ambulatory Visit | Attending: Internal Medicine | Admitting: Internal Medicine

## 2024-05-02 DIAGNOSIS — Z1231 Encounter for screening mammogram for malignant neoplasm of breast: Secondary | ICD-10-CM | POA: Diagnosis not present

## 2024-05-09 ENCOUNTER — Other Ambulatory Visit (HOSPITAL_COMMUNITY): Payer: Self-pay | Admitting: Internal Medicine

## 2024-05-09 DIAGNOSIS — R928 Other abnormal and inconclusive findings on diagnostic imaging of breast: Secondary | ICD-10-CM

## 2024-05-11 DIAGNOSIS — R928 Other abnormal and inconclusive findings on diagnostic imaging of breast: Secondary | ICD-10-CM | POA: Diagnosis not present

## 2024-05-11 DIAGNOSIS — G894 Chronic pain syndrome: Secondary | ICD-10-CM | POA: Diagnosis not present

## 2024-05-14 ENCOUNTER — Telehealth: Payer: Self-pay | Admitting: Internal Medicine

## 2024-05-14 DIAGNOSIS — Z79899 Other long term (current) drug therapy: Secondary | ICD-10-CM | POA: Diagnosis not present

## 2024-05-14 DIAGNOSIS — Z1211 Encounter for screening for malignant neoplasm of colon: Secondary | ICD-10-CM | POA: Diagnosis not present

## 2024-05-14 DIAGNOSIS — Z0000 Encounter for general adult medical examination without abnormal findings: Secondary | ICD-10-CM | POA: Diagnosis not present

## 2024-05-14 DIAGNOSIS — Z7189 Other specified counseling: Secondary | ICD-10-CM | POA: Diagnosis not present

## 2024-05-14 DIAGNOSIS — Z23 Encounter for immunization: Secondary | ICD-10-CM | POA: Diagnosis not present

## 2024-05-14 DIAGNOSIS — Z780 Asymptomatic menopausal state: Secondary | ICD-10-CM | POA: Diagnosis not present

## 2024-05-14 NOTE — Telephone Encounter (Signed)
 Patient called and asked that we send her medical records to a practice, [facility], Fax # [phone].  Records sent 05/14/24
